# Patient Record
Sex: Male | Born: 1955 | Race: Black or African American | Hispanic: No | Marital: Single | Smoking: Current every day smoker
Health system: Southern US, Community
[De-identification: ages and names within clinical notes are randomized; demographics above are authoritative.]

## PROBLEM LIST (undated history)

## (undated) DIAGNOSIS — K219 Gastro-esophageal reflux disease without esophagitis: Secondary | ICD-10-CM

## (undated) DIAGNOSIS — M545 Low back pain, unspecified: Secondary | ICD-10-CM

## (undated) DIAGNOSIS — K029 Dental caries, unspecified: Secondary | ICD-10-CM

## (undated) DIAGNOSIS — N400 Enlarged prostate without lower urinary tract symptoms: Secondary | ICD-10-CM

## (undated) DIAGNOSIS — E538 Deficiency of other specified B group vitamins: Secondary | ICD-10-CM

## (undated) DIAGNOSIS — F329 Major depressive disorder, single episode, unspecified: Secondary | ICD-10-CM

## (undated) DIAGNOSIS — I10 Essential (primary) hypertension: Secondary | ICD-10-CM

## (undated) DIAGNOSIS — F419 Anxiety disorder, unspecified: Secondary | ICD-10-CM

## (undated) DIAGNOSIS — M1990 Unspecified osteoarthritis, unspecified site: Secondary | ICD-10-CM

## (undated) DIAGNOSIS — E119 Type 2 diabetes mellitus without complications: Secondary | ICD-10-CM

## (undated) DIAGNOSIS — F32A Depression, unspecified: Secondary | ICD-10-CM

## (undated) DIAGNOSIS — K469 Unspecified abdominal hernia without obstruction or gangrene: Secondary | ICD-10-CM

## (undated) HISTORY — DX: Type 2 diabetes mellitus without complications: E11.9

## (undated) HISTORY — PX: HERNIA REPAIR: SHX51

## (undated) HISTORY — DX: Unspecified abdominal hernia without obstruction or gangrene: K46.9

## (undated) HISTORY — DX: Major depressive disorder, single episode, unspecified: F32.9

## (undated) HISTORY — DX: Gastro-esophageal reflux disease without esophagitis: K21.9

## (undated) HISTORY — DX: Low back pain: M54.5

## (undated) HISTORY — DX: Dental caries, unspecified: K02.9

## (undated) HISTORY — DX: Unspecified osteoarthritis, unspecified site: M19.90

## (undated) HISTORY — DX: Low back pain, unspecified: M54.50

## (undated) HISTORY — DX: Deficiency of other specified B group vitamins: E53.8

## (undated) HISTORY — DX: Essential (primary) hypertension: I10

## (undated) HISTORY — DX: Benign prostatic hyperplasia without lower urinary tract symptoms: N40.0

## (undated) HISTORY — DX: Depression, unspecified: F32.A

## (undated) HISTORY — DX: Anxiety disorder, unspecified: F41.9

---

## 2001-08-23 ENCOUNTER — Encounter: Payer: Self-pay | Admitting: Emergency Medicine

## 2001-08-23 ENCOUNTER — Emergency Department (HOSPITAL_COMMUNITY): Admission: EM | Admit: 2001-08-23 | Discharge: 2001-08-23 | Payer: Self-pay | Admitting: *Deleted

## 2002-09-12 ENCOUNTER — Encounter: Payer: Self-pay | Admitting: Emergency Medicine

## 2002-09-12 ENCOUNTER — Emergency Department (HOSPITAL_COMMUNITY): Admission: EM | Admit: 2002-09-12 | Discharge: 2002-09-12 | Payer: Self-pay | Admitting: Emergency Medicine

## 2003-10-01 ENCOUNTER — Ambulatory Visit (HOSPITAL_COMMUNITY): Admission: RE | Admit: 2003-10-01 | Discharge: 2003-10-01 | Payer: Self-pay | Admitting: Urology

## 2004-06-23 ENCOUNTER — Ambulatory Visit: Payer: Self-pay | Admitting: Internal Medicine

## 2004-07-05 ENCOUNTER — Ambulatory Visit: Payer: Self-pay | Admitting: Internal Medicine

## 2004-08-30 ENCOUNTER — Ambulatory Visit: Payer: Self-pay | Admitting: Internal Medicine

## 2004-10-03 ENCOUNTER — Ambulatory Visit: Payer: Self-pay | Admitting: Internal Medicine

## 2004-11-18 ENCOUNTER — Ambulatory Visit: Payer: Self-pay | Admitting: Internal Medicine

## 2004-12-26 ENCOUNTER — Ambulatory Visit: Payer: Self-pay | Admitting: Internal Medicine

## 2005-01-02 ENCOUNTER — Ambulatory Visit: Payer: Self-pay | Admitting: Internal Medicine

## 2005-01-11 ENCOUNTER — Ambulatory Visit (HOSPITAL_COMMUNITY): Admission: RE | Admit: 2005-01-11 | Discharge: 2005-01-11 | Payer: Self-pay | Admitting: Chiropractic Medicine

## 2005-01-23 ENCOUNTER — Ambulatory Visit: Payer: Self-pay | Admitting: Internal Medicine

## 2005-05-02 ENCOUNTER — Ambulatory Visit: Payer: Self-pay | Admitting: Internal Medicine

## 2006-05-01 ENCOUNTER — Ambulatory Visit: Payer: Self-pay | Admitting: Internal Medicine

## 2006-09-05 ENCOUNTER — Ambulatory Visit: Payer: Self-pay | Admitting: Internal Medicine

## 2007-03-13 ENCOUNTER — Ambulatory Visit: Payer: Self-pay | Admitting: Internal Medicine

## 2007-03-13 LAB — CONVERTED CEMR LAB
Cholesterol: 180 mg/dL (ref 0–200)
Direct LDL: 65.2 mg/dL
Glucose, Bld: 96 mg/dL (ref 70–99)
HDL: 34.1 mg/dL — ABNORMAL LOW (ref >39.0)
Sodium: 143 meq/L (ref 135–145)

## 2007-03-20 DIAGNOSIS — G5600 Carpal tunnel syndrome, unspecified upper limb: Secondary | ICD-10-CM

## 2007-03-20 DIAGNOSIS — I10 Essential (primary) hypertension: Secondary | ICD-10-CM | POA: Insufficient documentation

## 2007-03-20 DIAGNOSIS — F4323 Adjustment disorder with mixed anxiety and depressed mood: Secondary | ICD-10-CM | POA: Insufficient documentation

## 2007-03-20 DIAGNOSIS — N320 Bladder-neck obstruction: Secondary | ICD-10-CM

## 2007-03-20 DIAGNOSIS — M545 Low back pain: Secondary | ICD-10-CM

## 2007-03-20 DIAGNOSIS — N529 Male erectile dysfunction, unspecified: Secondary | ICD-10-CM | POA: Insufficient documentation

## 2007-03-27 ENCOUNTER — Ambulatory Visit: Payer: Self-pay | Admitting: Internal Medicine

## 2007-06-24 ENCOUNTER — Ambulatory Visit: Payer: Self-pay | Admitting: Internal Medicine

## 2007-06-24 DIAGNOSIS — M1990 Unspecified osteoarthritis, unspecified site: Secondary | ICD-10-CM | POA: Insufficient documentation

## 2007-06-24 DIAGNOSIS — N400 Enlarged prostate without lower urinary tract symptoms: Secondary | ICD-10-CM | POA: Insufficient documentation

## 2007-06-24 DIAGNOSIS — E1165 Type 2 diabetes mellitus with hyperglycemia: Secondary | ICD-10-CM

## 2007-06-24 DIAGNOSIS — K219 Gastro-esophageal reflux disease without esophagitis: Secondary | ICD-10-CM

## 2007-06-24 DIAGNOSIS — F411 Generalized anxiety disorder: Secondary | ICD-10-CM

## 2007-06-24 DIAGNOSIS — E119 Type 2 diabetes mellitus without complications: Secondary | ICD-10-CM | POA: Insufficient documentation

## 2007-06-24 DIAGNOSIS — J209 Acute bronchitis, unspecified: Secondary | ICD-10-CM

## 2007-06-24 LAB — CONVERTED CEMR LAB: Blood Glucose, Fingerstick: 74

## 2007-06-25 LAB — CONVERTED CEMR LAB
Creatinine, Ser: 1.4 mg/dL (ref 0.4–1.5)
Glucose, Bld: 95 mg/dL (ref 70–99)
Hgb A1c MFr Bld: 6.6 % — ABNORMAL HIGH (ref 4.6–6.0)
Potassium: 3.8 meq/L (ref 3.5–5.1)
Sodium: 140 meq/L (ref 135–145)

## 2007-08-22 DIAGNOSIS — E538 Deficiency of other specified B group vitamins: Secondary | ICD-10-CM

## 2007-08-22 HISTORY — DX: Deficiency of other specified B group vitamins: E53.8

## 2007-10-11 ENCOUNTER — Ambulatory Visit: Payer: Self-pay | Admitting: Internal Medicine

## 2007-10-11 LAB — CONVERTED CEMR LAB: Blood Glucose, Fingerstick: 93

## 2007-12-31 ENCOUNTER — Telehealth: Payer: Self-pay | Admitting: Internal Medicine

## 2008-02-06 ENCOUNTER — Ambulatory Visit: Payer: Self-pay | Admitting: Internal Medicine

## 2008-02-08 LAB — CONVERTED CEMR LAB
ALT: 16 units/L (ref 0–53)
Albumin: 3.8 g/dL (ref 3.5–5.2)
BUN: 12 mg/dL (ref 6–23)
Bilirubin, Direct: 0.1 mg/dL (ref 0.0–0.3)
Calcium: 9.4 mg/dL (ref 8.4–10.5)
Creatinine, Ser: 1.3 mg/dL (ref 0.4–1.5)
GFR calc Af Amer: 75 mL/min
Glucose, Bld: 131 mg/dL — ABNORMAL HIGH (ref 70–99)
HDL: 41.1 mg/dL (ref >39.0)
Hgb A1c MFr Bld: 6.6 % — ABNORMAL HIGH (ref 4.6–6.0)
Sodium: 141 meq/L (ref 135–145)
TSH: 0.91 microintl units/mL (ref 0.35–5.50)
Total Protein: 7.1 g/dL (ref 6.0–8.3)
Triglycerides: 134 mg/dL (ref 0–149)
VLDL: 27 mg/dL (ref 0–40)
Vitamin B-12: 221 pg/mL (ref 211–911)

## 2008-02-11 ENCOUNTER — Encounter: Payer: Self-pay | Admitting: Internal Medicine

## 2008-03-09 ENCOUNTER — Ambulatory Visit: Payer: Self-pay | Admitting: Internal Medicine

## 2008-03-09 DIAGNOSIS — E538 Deficiency of other specified B group vitamins: Secondary | ICD-10-CM

## 2008-03-24 ENCOUNTER — Telehealth: Payer: Self-pay | Admitting: Internal Medicine

## 2008-07-07 ENCOUNTER — Ambulatory Visit: Payer: Self-pay | Admitting: Internal Medicine

## 2008-07-07 LAB — CONVERTED CEMR LAB
BUN: 12 mg/dL (ref 6–23)
Calcium: 9.6 mg/dL (ref 8.4–10.5)
Creatinine, Ser: 1.4 mg/dL (ref 0.4–1.5)
GFR calc Af Amer: 68 mL/min
GFR calc non Af Amer: 57 mL/min
Hgb A1c MFr Bld: 6.8 % — ABNORMAL HIGH (ref 4.6–6.0)

## 2008-07-08 ENCOUNTER — Ambulatory Visit: Payer: Self-pay | Admitting: Internal Medicine

## 2008-07-14 IMAGING — CR DG CHEST 2V
2 series · 2 of 2 positions shown · non-contrast
Comparison: 09/12/02.

CLINICAL DATA: No history provided.
 CHEST ? 2 VIEW:

[view not recorded (1 of 2)]
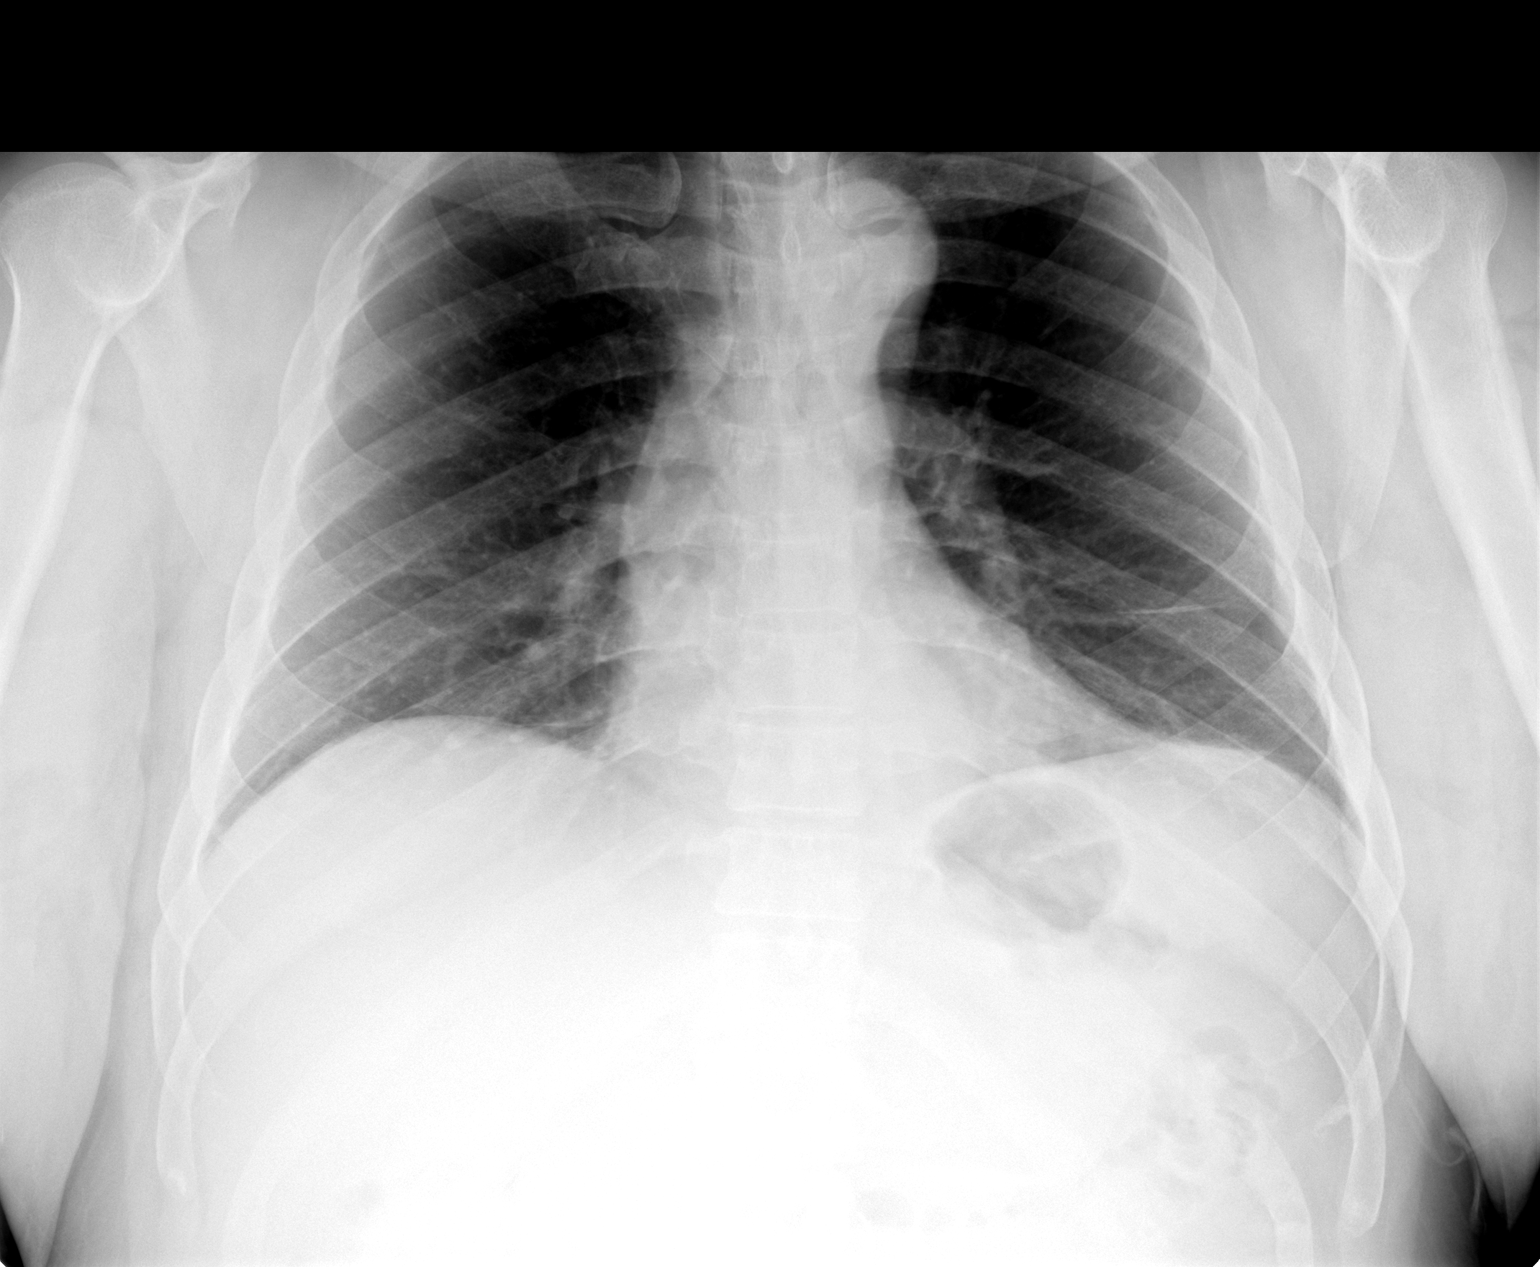

[view not recorded (2 of 2)]
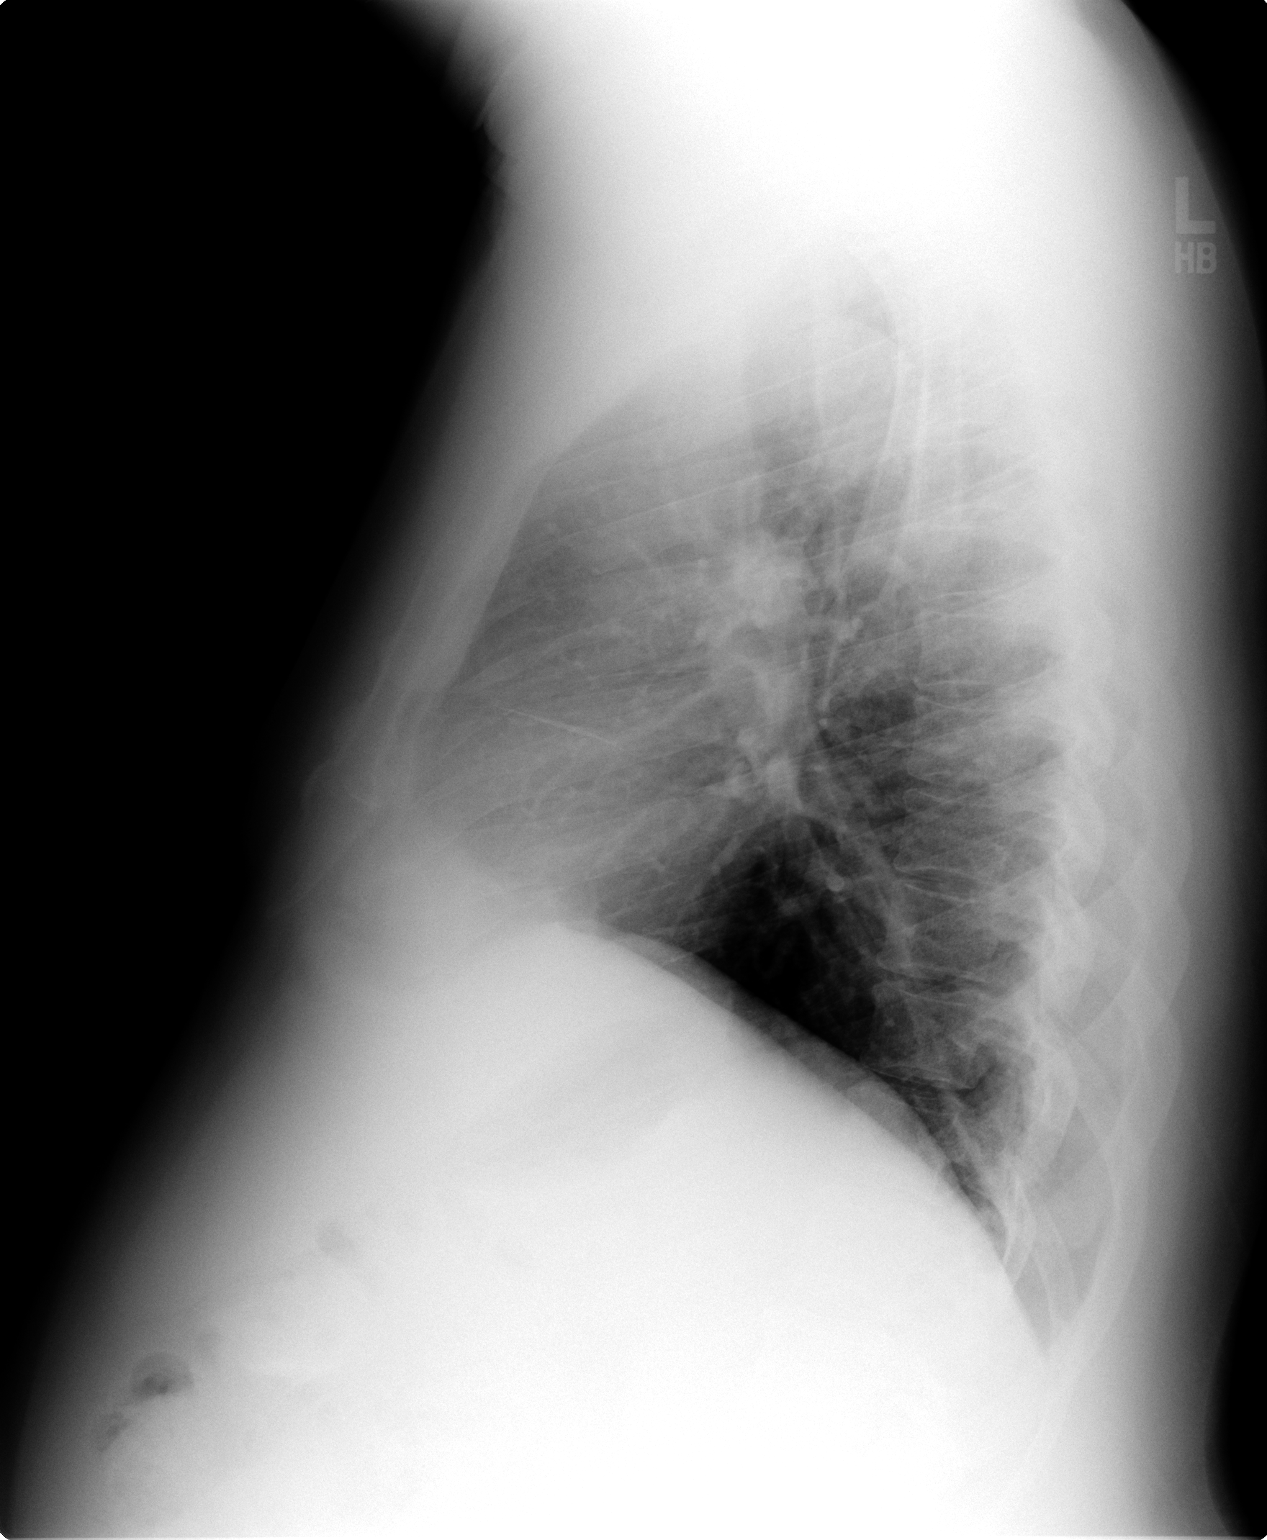

[2 of 2 positions shown; findings below may reference images not displayed]

FINDINGS: Trachea is midline.  Heart size is stable.  Left basilar subsegmental atelectasis and/or scar.  The lungs are otherwise clear.
IMPRESSION: No acute findings.

## 2008-10-12 ENCOUNTER — Ambulatory Visit: Payer: Self-pay | Admitting: Internal Medicine

## 2008-10-15 LAB — CONVERTED CEMR LAB
ALT: 23 units/L (ref 0–53)
AST: 18 units/L (ref 0–37)
CO2: 30 meq/L (ref 19–32)
Calcium: 9.9 mg/dL (ref 8.4–10.5)
Chloride: 101 meq/L (ref 96–112)
Creatinine, Ser: 1.2 mg/dL (ref 0.4–1.5)
Glucose, Bld: 115 mg/dL — ABNORMAL HIGH (ref 70–99)
Hgb A1c MFr Bld: 7.2 % — ABNORMAL HIGH (ref 4.6–6.0)
TSH: 0.7 microintl units/mL (ref 0.35–5.50)
Total Bilirubin: 0.8 mg/dL (ref 0.3–1.2)
Total Protein: 7.6 g/dL (ref 6.0–8.3)

## 2008-10-16 ENCOUNTER — Ambulatory Visit: Payer: Self-pay | Admitting: Internal Medicine

## 2009-01-15 ENCOUNTER — Ambulatory Visit: Payer: Self-pay | Admitting: Internal Medicine

## 2009-01-15 LAB — CONVERTED CEMR LAB
BUN: 14 mg/dL (ref 6–23)
CO2: 29 meq/L (ref 19–32)
Calcium: 9.3 mg/dL (ref 8.4–10.5)
GFR calc non Af Amer: 68.24 mL/min (ref >60)
Glucose, Bld: 157 mg/dL — ABNORMAL HIGH (ref 70–99)
Potassium: 3.5 meq/L (ref 3.5–5.1)
Sodium: 140 meq/L (ref 135–145)

## 2009-01-20 ENCOUNTER — Telehealth (INDEPENDENT_AMBULATORY_CARE_PROVIDER_SITE_OTHER): Payer: Self-pay | Admitting: *Deleted

## 2009-02-02 ENCOUNTER — Telehealth: Payer: Self-pay | Admitting: Internal Medicine

## 2009-05-21 ENCOUNTER — Ambulatory Visit: Payer: Self-pay | Admitting: Internal Medicine

## 2009-05-21 DIAGNOSIS — K029 Dental caries, unspecified: Secondary | ICD-10-CM

## 2009-05-24 LAB — CONVERTED CEMR LAB
BUN: 16 mg/dL (ref 6–23)
Chloride: 105 meq/L (ref 96–112)
Creatinine, Ser: 1.4 mg/dL (ref 0.4–1.5)
Glucose, Bld: 111 mg/dL — ABNORMAL HIGH (ref 70–99)
Potassium: 3.9 meq/L (ref 3.5–5.1)

## 2009-07-29 ENCOUNTER — Telehealth: Payer: Self-pay | Admitting: Internal Medicine

## 2009-08-05 ENCOUNTER — Emergency Department (HOSPITAL_BASED_OUTPATIENT_CLINIC_OR_DEPARTMENT_OTHER): Admission: EM | Admit: 2009-08-05 | Discharge: 2009-08-06 | Payer: Self-pay | Admitting: Emergency Medicine

## 2009-08-06 ENCOUNTER — Telehealth: Payer: Self-pay | Admitting: Internal Medicine

## 2009-08-06 ENCOUNTER — Ambulatory Visit: Payer: Self-pay | Admitting: Diagnostic Radiology

## 2009-08-10 ENCOUNTER — Ambulatory Visit: Payer: Self-pay | Admitting: Internal Medicine

## 2009-08-10 DIAGNOSIS — K4090 Unilateral inguinal hernia, without obstruction or gangrene, not specified as recurrent: Secondary | ICD-10-CM | POA: Insufficient documentation

## 2009-09-22 ENCOUNTER — Encounter: Payer: Self-pay | Admitting: Internal Medicine

## 2009-09-24 ENCOUNTER — Ambulatory Visit: Payer: Self-pay | Admitting: Internal Medicine

## 2009-11-05 ENCOUNTER — Telehealth: Payer: Self-pay | Admitting: Internal Medicine

## 2009-12-03 ENCOUNTER — Ambulatory Visit: Payer: Self-pay | Admitting: Internal Medicine

## 2010-03-30 ENCOUNTER — Ambulatory Visit: Payer: Self-pay | Admitting: Internal Medicine

## 2010-04-04 ENCOUNTER — Telehealth: Payer: Self-pay | Admitting: Internal Medicine

## 2010-04-18 ENCOUNTER — Encounter: Payer: Self-pay | Admitting: Internal Medicine

## 2010-06-29 ENCOUNTER — Ambulatory Visit: Payer: Self-pay | Admitting: Internal Medicine

## 2010-07-01 ENCOUNTER — Ambulatory Visit: Payer: Self-pay | Admitting: Internal Medicine

## 2010-07-01 LAB — CONVERTED CEMR LAB
AST: 17 units/L (ref 0–37)
Albumin: 3.7 g/dL (ref 3.5–5.2)
Alkaline Phosphatase: 42 units/L (ref 39–117)
BUN: 14 mg/dL (ref 6–23)
Basophils Relative: 0.4 % (ref 0.0–3.0)
Bilirubin, Direct: 0.1 mg/dL (ref 0.0–0.3)
CO2: 28 meq/L (ref 19–32)
Chloride: 102 meq/L (ref 96–112)
Creatinine, Ser: 1.2 mg/dL (ref 0.4–1.5)
Direct LDL: 71.5 mg/dL
Eosinophils Absolute: 0.3 10*3/uL (ref 0.0–0.7)
Glucose, Bld: 167 mg/dL — ABNORMAL HIGH (ref 70–99)
HCT: 43.2 % (ref 39.0–52.0)
Hemoglobin: 14.8 g/dL (ref 13.0–17.0)
Lymphocytes Relative: 38.1 % (ref 12.0–46.0)
Lymphs Abs: 3.5 10*3/uL (ref 0.7–4.0)
MCHC: 34.4 g/dL (ref 30.0–36.0)
MCV: 92.6 fL (ref 78.0–100.0)
Neutro Abs: 4.7 10*3/uL (ref 1.4–7.7)
Potassium: 3.8 meq/L (ref 3.5–5.1)
RBC: 4.66 M/uL (ref 4.22–5.81)
Total Bilirubin: 0.6 mg/dL (ref 0.3–1.2)
Total CHOL/HDL Ratio: 4
VLDL: 45.8 mg/dL — ABNORMAL HIGH (ref 0.0–40.0)

## 2010-08-25 ENCOUNTER — Telehealth: Payer: Self-pay | Admitting: Internal Medicine

## 2010-09-20 NOTE — Assessment & Plan Note (Signed)
Summary: 3 MO ROV/NWS  #   Vital Signs:  Patient profile:   55 year old male Height:      67 inches Weight:      212 pounds BMI:     33.32 Temp:     99.0 degrees F oral Pulse rate:   72 / minute Pulse rhythm:   regular Resp:     16 per minute BP sitting:   112 / 80  (left arm) Cuff size:   regular  Vitals Entered By: Lanier Prude, Beverly Gust) (July 01, 2010 4:13 PM) CC: 3 mo f/u Is Patient Diabetic? Yes   CC:  3 mo f/u.  History of Present Illness: The patient presents for a follow up of hypertension, diabetes, hyperlipidemia   Allergies: No Known Drug Allergies  Past History:  Past Medical History: Last updated: 12/03/2009 Depression Hypertension Low back pain Anxiety GERD Osteoarthritis Benign prostatic hypertrophy Diabetes mellitus, type II Low Vit B12 2009 R hernia  Social History: Last updated: 05/21/2009 Occupation: on disability Single Current Smoker (chewing) Alcohol use-no Regular exercise-no  Review of Systems       The patient complains of abdominal pain and hematuria.  The patient denies fever, weight loss, weight gain, dyspnea on exertion, melena, hematochezia, and severe indigestion/heartburn.    Physical Exam  General:  overweight-appearing.   Nose:  External nasal examination shows no deformity or inflammation. Nasal mucosa are pink and moist without lesions or exudates. Mouth:  Oral mucosa and oropharynx without lesions or exudates.  Teeth and gums are in bad shape w/swelling  and gingivitis Lungs:  Normal respiratory effort, chest expands symmetrically. Lungs are clear to auscultation, no crackles or wheezes. Heart:  Normal rate and regular rhythm. S1 and S2 normal without gallop, murmur, click, rub or other extra sounds. Abdomen:  Bowel sounds positive,abdomen soft and non-tender without masses, organomegaly. 4-5 cm R inguinalhernia was noted. NT. Msk:  Lumbar-sacral spine is tender to palpation over paraspinal muscles and  painfull with the ROM  B knees are tender w/ROM Neurologic:  No cranial nerve deficits noted. Station and gait are normal. Plantar reflexes are down-going bilaterally. DTRs are symmetrical throughout. Sensory, motor and coordinative functions appear intact. Skin:  WNL  Psych:  Oriented X3, normally interactive, good eye contact, and not anxious appearing.     Impression & Recommendations:  Problem # 1:  DENTAL CARIES (ICD-521.00) Assessment Unchanged States he is working on getting an appt at [facility]  Problem # 2:  B12 DEFICIENCY (ICD-266.2) Assessment: Unchanged On the regimen of medicine(s) reflected in the chart    Problem # 3:  DIABETES MELLITUS, TYPE II (ICD-250.00) Assessment: Deteriorated  The following medications were removed from the medication list:    Glucovance 2.5-500 Mg Tabs (Glyburide-metformin) .Marland Kitchen... 1 two times a day His updated medication list for this problem includes:    Glimepiride 4 Mg Tabs (Glimepiride) .Marland Kitchen... 1 by mouth once daily for diabetes    Metformin Hcl 1000 Mg Tabs (Metformin hcl) .Marland Kitchen... 1 by mouth two times a day for diabetes  Labs Reviewed: Creat: 1.2 (06/29/2010)    Reviewed HgBA1c results: 8.3 (06/29/2010)  7.4 (05/21/2009)  Problem # 4:  ANXIETY (ICD-300.00) Assessment: Improved  His updated medication list for this problem includes:    Wellbutrin Sr 150 Mg Tb12 (Bupropion hcl) ..... Once daily  Problem # 5:  OSTEOARTHRITIS (ICD-715.90) Assessment: Unchanged  His updated medication list for this problem includes:    Hydrocodone-acetaminophen 5-325 Mg Tabs (Hydrocodone-acetaminophen) .Marland Kitchen... 1 by  mouth qid prn  Complete Medication List: 1)  Wellbutrin Sr 150 Mg Tb12 (Bupropion hcl) .... Once daily 2)  Maxzide-25 37.5-25 Mg Tabs (Triamterene-hctz) .... Once daily 3)  Klor-con M20 20 Meq Tbcr (Potassium chloride crys cr) .... Once daily 4)  Fish Oil Oil (Fish oil) .Marland Kitchen.. 1 by mouth bid 5)  Vitamin B-12 Cr 1000 Mcg Tbcr (Cyanocobalamin)  .... Take one tablet by mouth daily 6)  Vitamin D3 1000 Unit Tabs (Cholecalciferol) .Marland Kitchen.. 1 qd 7)  Cialis 20 Mg Tabs (Tadalafil) .Marland Kitchen.. 1 tablet every other day as needed for erectile dysfunction 8)  Glimepiride 4 Mg Tabs (Glimepiride) .Marland Kitchen.. 1 by mouth once daily for diabetes 9)  Metformin Hcl 1000 Mg Tabs (Metformin hcl) .Marland Kitchen.. 1 by mouth two times a day for diabetes 10)  Hydrocodone-acetaminophen 5-325 Mg Tabs (Hydrocodone-acetaminophen) .Marland Kitchen.. 1 by mouth qid prn  Patient Instructions: 1)  Please schedule a follow-up appointment in 4 months. 2)  BMP prior to visit, ICD-9: 3)  PSA prior to visit, ICD-9: 4)  HbgA1C prior to visit, ICD-9:995.20 v70.0  250.02 Prescriptions: HYDROCODONE-ACETAMINOPHEN 5-325 MG  TABS (HYDROCODONE-ACETAMINOPHEN) 1 by mouth qid prn  #60 x 1   Entered and Authorized by:   Tresa Garter MD   Signed by:   Tresa Garter MD on 07/01/2010   Method used:   Print then Give to Patient   RxID:   [id number] METFORMIN HCL 1000 MG TABS (METFORMIN HCL) 1 by mouth two times a day for diabetes  #60 x 12   Entered and Authorized by:   Tresa Garter MD   Signed by:   Tresa Garter MD on 07/01/2010   Method used:   Print then Give to Patient   RxID:   [id number] GLIMEPIRIDE 4 MG TABS (GLIMEPIRIDE) 1 by mouth once daily for diabetes  #30 x 12   Entered and Authorized by:   Tresa Garter MD   Signed by:   Tresa Garter MD on 07/01/2010   Method used:   Print then Give to Patient   RxID:   [id number]    Orders Added: 1)  Est. Patient Level IV [01027]    Contraindications/Deferment of Procedures/Staging:    Test/Procedure: FLU VAX    Reason for deferment: patient declined  Appended Document: 3 MO ROV/NWS  # Correction : no hematuria, no abd pain

## 2010-09-20 NOTE — Medication Information (Signed)
Summary: Diabetes Supplies/[facility]  Diabetes Supplies/[facility]   Imported By: Sherian Rein 04/22/2010 08:22:36  _____________________________________________________________________  External Attachment:    Type:   Image     Comment:   External Document

## 2010-09-20 NOTE — Letter (Signed)
Summary: Generic Letter  [facility]  [address] [place], [place] [address]   Phone: [phone]  Fax: [phone]    12/03/2009  TO: [employer]  RE: Timothy Schmidt [address] [place], [place]  [address]    [name] has been disabled for some time. Please excuse his late payment.           Sincerely,   Jacinta Shoe MD

## 2010-09-20 NOTE — Progress Notes (Signed)
Summary: RESULTS  Phone Note Call from Patient   Summary of Call: Patient is requesting results of labs. I see lab visit from last wednesday but no results. Will inquire with lab. - Per lab pt was not fasting and he was going to return. He must have forgotten, need to call paitent and advise him to come back to lab fasting.  Initial call taken by: Lamar Sprinkles, CMA,  April 04, 2010 12:42 PM  Follow-up for Phone Call        Left detailed vm on hm # for patient to come into lab for fasting labs.  Follow-up by: Lamar Sprinkles, CMA,  April 04, 2010 3:20 PM

## 2010-09-20 NOTE — Assessment & Plan Note (Signed)
Summary: 4 MO ROV /NWS  #   Vital Signs:  Patient profile:   55 year old male Height:      67 inches Weight:      213.50 pounds BMI:     33.56 O2 Sat:      96 % on Room air Temp:     99.1 degrees F oral Pulse rate:   102 / minute BP sitting:   130 / 70  (left arm) Cuff size:   large  Vitals Entered By: Lucious Groves (December 03, 2009 2:22 PM)  O2 Flow:  Room air CC: Pt is here to discuss letter for [employer], refill PCN, and blood sugar check. Is Patient Diabetic? Yes Did you bring your meter with you today? No Pain Assessment Patient in pain? no      CBG Result 146 Comments Patient notes that he has not had surgery, nor has he gotten his teeth fixed./kb   CC:  Pt is here to discuss letter for [employer], refill PCN, and and blood sugar check..  History of Present Illness: The patient presents for a follow up of hypertension, diabetes, hyperlipidemia C/o teeth problems, hernia, OA  Current Medications (verified): 1)  Wellbutrin Sr 150 Mg  Tb12 (Bupropion Hcl) .... Once Daily 2)  Glucovance 2.5-500 Mg  Tabs (Glyburide-Metformin) .Marland Kitchen.. 1 Two Times A Day 3)  Maxzide-25 37.5-25 Mg Tabs (Triamterene-Hctz) .... Once Daily 4)  Klor-Con M20 20 Meq  Tbcr (Potassium Chloride Crys Cr) .... Once Daily 5)  Hydrocodone-Acetaminophen 5-325 Mg  Tabs (Hydrocodone-Acetaminophen) .Marland Kitchen.. 1 By Mouth Qid Prn 6)  Fish Oil   Oil (Fish Oil) .Marland Kitchen.. 1 By Mouth Bid 7)  Vitamin B-12 Cr 1000 Mcg  Tbcr (Cyanocobalamin) .... Take One Tablet By Mouth Daily 8)  Vitamin D3 1000 Unit  Tabs (Cholecalciferol) .Marland Kitchen.. 1 Qd 9)  Penicillin V Potassium 500 Mg  Tabs (Penicillin V Potassium) .Marland Kitchen.. 1 Qid X10 D Prn 10)  Cialis 20 Mg Tabs (Tadalafil) .Marland Kitchen.. 1 Tablet Every Other Day As Needed For Erectile Dysfunction  Allergies (verified): No Known Drug Allergies  Past History:  Social History: Last updated: 05/21/2009 Occupation: on disability Single Current Smoker (chewing) Alcohol use-no Regular exercise-no  Past Medical  History: Depression Hypertension Low back pain Anxiety GERD Osteoarthritis Benign prostatic hypertrophy Diabetes mellitus, type II Low Vit B12 2009 R hernia  Review of Systems       The patient complains of fever.  The patient denies weight gain, vision loss, peripheral edema, hemoptysis, and abdominal pain.         OA, LBP  Physical Exam  General:  overweight-appearing.   Nose:  External nasal examination shows no deformity or inflammation. Nasal mucosa are pink and moist without lesions or exudates. Mouth:  Oral mucosa and oropharynx without lesions or exudates.  Teeth and gums are in bad shape w/swelling  and gingivitis Lungs:  Normal respiratory effort, chest expands symmetrically. Lungs are clear to auscultation, no crackles or wheezes. Heart:  Normal rate and regular rhythm. S1 and S2 normal without gallop, murmur, click, rub or other extra sounds. Abdomen:  Bowel sounds positive,abdomen soft and non-tender without masses, organomegaly. 4-5 cm R inguinalhernia was noted. NT. Msk:  Lumbar-sacral spine is tender to palpation over paraspinal muscles and painfull with the ROM  B knees are tender w/ROM Neurologic:  No cranial nerve deficits noted. Station and gait are normal. Plantar reflexes are down-going bilaterally. DTRs are symmetrical throughout. Sensory, motor and coordinative functions appear intact. Skin:  WNL  Psych:  Oriented X3, normally interactive, good eye contact, and not anxious appearing.     Impression & Recommendations:  Problem # 1:  INGUINAL HERNIA, UNILATERAL (ICD-550.90) Assessment Unchanged Surgery is pending   Problem # 2:  DENTAL CARIES (ICD-521.00) Assessment: Unchanged Unable to afford care; still chewing tobacco...  Problem # 3:  DIABETES MELLITUS, TYPE II (ICD-250.00) Assessment: Unchanged  His updated medication list for this problem includes:    Glucovance 2.5-500 Mg Tabs (Glyburide-metformin) .Marland Kitchen... 1 two times a day  Orders: Glucose,  (CBG) (11914)  Problem # 4:  OSTEOARTHRITIS (ICD-715.90) Assessment: Unchanged  His updated medication list for this problem includes:    Hydrocodone-acetaminophen 5-325 Mg Tabs (Hydrocodone-acetaminophen) .Marland Kitchen... 1 by mouth qid prn  Problem # 5:  HYPERTENSION (ICD-401.9) Assessment: Unchanged  His updated medication list for this problem includes:    Maxzide-25 37.5-25 Mg Tabs (Triamterene-hctz) ..... Once daily  BP today: 130/70 Prior BP: 110/70 (08/10/2009)  Labs Reviewed: K+: 3.9 (05/21/2009) Creat: : 1.4 (05/21/2009)   Chol: 178 (02/06/2008)   HDL: 41.1 (02/06/2008)   LDL: 110 (02/06/2008)   TG: 134 (02/06/2008)  Problem # 6:  Low grade fever Assessment: New possible URI Use over-the-counter medicines for "cold": Tylenol  650mg  or Advil 400mg  every 6 hours  for fever; Delsym or Robutussin for cough. Mucinex or Mucinex D for congestion. Ricola or Halls for sore throat. Office visit if not better or if worse.   Complete Medication List: 1)  Wellbutrin Sr 150 Mg Tb12 (Bupropion hcl) .... Once daily 2)  Glucovance 2.5-500 Mg Tabs (Glyburide-metformin) .Marland Kitchen.. 1 two times a day 3)  Maxzide-25 37.5-25 Mg Tabs (Triamterene-hctz) .... Once daily 4)  Klor-con M20 20 Meq Tbcr (Potassium chloride crys cr) .... Once daily 5)  Hydrocodone-acetaminophen 5-325 Mg Tabs (Hydrocodone-acetaminophen) .Marland Kitchen.. 1 by mouth qid prn 6)  Fish Oil Oil (Fish oil) .Marland Kitchen.. 1 by mouth bid 7)  Vitamin B-12 Cr 1000 Mcg Tbcr (Cyanocobalamin) .... Take one tablet by mouth daily 8)  Vitamin D3 1000 Unit Tabs (Cholecalciferol) .Marland Kitchen.. 1 qd 9)  Penicillin V Potassium 500 Mg Tabs (Penicillin v potassium) .Marland Kitchen.. 1 qid x10 d prn 10)  Cialis 20 Mg Tabs (Tadalafil) .Marland Kitchen.. 1 tablet every other day as needed for erectile dysfunction  Patient Instructions: 1)  Please schedule a follow-up appointment in 3 months. 2)  BMP prior to visit, ICD-9: 3)  Hepatic Panel prior to visit, ICD-9:790.29  995.20 250.00 4)  Lipid Panel prior to  visit, ICD-9: 5)  TSH prior to visit, ICD-9: 6)  CBC w/ Diff prior to visit, ICD-9: 7)  HbgA1C prior to visit, ICD-9: 8)  Vit B12 266.20 9)  Use over-the-counter medicines for "cold": Tylenol  650mg  or Advil 400mg  every 6 hours  for fever; Delsym or Robutussin for cough. Mucinex or Mucinex D for congestion. Ricola or Halls for sore throat. Office visit if not better or if worse.  Prescriptions: PENICILLIN V POTASSIUM 500 MG  TABS (PENICILLIN V POTASSIUM) 1 qid x10 d prn  #120 x 3   Entered and Authorized by:   Tresa Garter MD   Signed by:   Tresa Garter MD on 12/03/2009   Method used:   Electronically to        [facility] [id number] (retail)       [address]       [facility]       [place], [place]  [address]  Ph: [phone] or [phone]       Fax: [phone]   RxID:   [id number]

## 2010-09-20 NOTE — Medication Information (Signed)
Summary: Diabetic Supplies / [employer]  Diabetic Supplies / [employer]   Imported By: Lennie Odor 04/20/2010 14:34:20  _____________________________________________________________________  External Attachment:    Type:   Image     Comment:   External Document

## 2010-09-20 NOTE — Progress Notes (Signed)
Summary: FYI  Phone Note Call from Patient   Summary of Call: Pt c/o cough, fever, sweats at the beginning of this week. He used OTC diabetic tussin for cough and feels much better. No sob. Only complaint is that mucus comming out of nose is white. Advise if cough returns, fever or any change in symptoms go to UC, ER, or call office, pt agreed.  Initial call taken by: Lamar Sprinkles, CMA,  November 05, 2009 12:24 PM  Follow-up for Phone Call        Agree. Thx Follow-up by: Tresa Garter MD,  November 05, 2009 5:54 PM

## 2010-09-20 NOTE — Consult Note (Signed)
Summary: [facility]   Imported By: Sherian Rein 10/06/2009 10:23:45  _____________________________________________________________________  External Attachment:    Type:   Image     Comment:   External Document

## 2010-09-22 ENCOUNTER — Emergency Department (HOSPITAL_BASED_OUTPATIENT_CLINIC_OR_DEPARTMENT_OTHER)
Admission: EM | Admit: 2010-09-22 | Discharge: 2010-09-22 | Disposition: A | Discharge status: Home / Self Care | Payer: Medicare Other | Attending: Emergency Medicine | Admitting: Emergency Medicine

## 2010-09-22 ENCOUNTER — Emergency Department (INDEPENDENT_AMBULATORY_CARE_PROVIDER_SITE_OTHER): Payer: Medicare Other

## 2010-09-22 ENCOUNTER — Other Ambulatory Visit: Payer: Self-pay | Admitting: Emergency Medicine

## 2010-09-22 DIAGNOSIS — M25519 Pain in unspecified shoulder: Secondary | ICD-10-CM

## 2010-09-22 DIAGNOSIS — IMO0002 Reserved for concepts with insufficient information to code with codable children: Secondary | ICD-10-CM | POA: Insufficient documentation

## 2010-09-22 DIAGNOSIS — E1169 Type 2 diabetes mellitus with other specified complication: Secondary | ICD-10-CM | POA: Insufficient documentation

## 2010-09-22 DIAGNOSIS — X500XXA Overexertion from strenuous movement or load, initial encounter: Secondary | ICD-10-CM

## 2010-09-22 DIAGNOSIS — I509 Heart failure, unspecified: Secondary | ICD-10-CM | POA: Insufficient documentation

## 2010-09-22 DIAGNOSIS — I10 Essential (primary) hypertension: Secondary | ICD-10-CM | POA: Insufficient documentation

## 2010-09-22 DIAGNOSIS — Y92009 Unspecified place in unspecified non-institutional (private) residence as the place of occurrence of the external cause: Secondary | ICD-10-CM | POA: Insufficient documentation

## 2010-09-22 LAB — POCT CARDIAC MARKERS: Troponin i, poc: <0.05 ng/mL (ref 0.00–0.09)

## 2010-09-22 NOTE — Progress Notes (Signed)
Summary: REFERRAL AT [facility]  Phone Note Call from Patient Call back at Home Phone [phone]   Caller: Patient Summary of Call: left vm req referral...........Marland Kitchenleft mess for pt to call back with details Initial call taken by: Lanier Prude, [facility]),  August 25, 2010 4:59 PM  Follow-up for Phone Call        left message on machine for pt to return my call and advise of type of referral and reason for referral. Margaret Pyle, CMA  August 26, 2010 10:51 AM   Additional Follow-up for Phone Call Additional follow up Details #1::        WANTS TO GO TO [facility] FOR SURGERY ON [address]. [phone] OPT 1 IS THE NUMBER TO CALL. Additional Follow-up by: Hilarie Fredrickson,  August 26, 2010 10:56 AM    Additional Follow-up for Phone Call Additional follow up Details #2::    done Follow-up by: Tresa Garter MD,  August 29, 2010 1:25 PM  Additional Follow-up for Phone Call Additional follow up Details #3:: Details for Additional Follow-up Action Taken: [facility] to notify.Marland KitchenMarland KitchenAlvy Beal Archie CMA  August 30, 2010 12:59 PM

## 2010-09-28 ENCOUNTER — Telehealth: Payer: Self-pay | Admitting: Internal Medicine

## 2010-10-12 NOTE — Progress Notes (Signed)
Summary: ER Xray  Phone Note Call from Patient Call back at Home Phone [phone]   Summary of Call: Patient is requesting to have Dr Posey Rea review xrays - went to ER for shoulder pain.  Initial call taken by: Lamar Sprinkles, CMA,  September 28, 2010 9:36 AM  Follow-up for Phone Call        Pt called again. He wants to know what xrays show. Continues to have shoulder pain. Per pt, ER did not give him dx or anything further except pain meds. Wants to know if it could be spurs in his tissue?   Does pt need ortho Dr Doristine Section? Or can Dr Macario Golds help w/this?  Follow-up by: Lamar Sprinkles, CMA,  September 30, 2010 4:29 PM  Additional Follow-up for Phone Call Additional follow up Details #1::        CXR and shoulder xrays were normal OV with any MD this wk Additional Follow-up by: Tresa Garter MD,  October 03, 2010 5:46 PM    Additional Follow-up for Phone Call Additional follow up Details #2::    He feels better - Pt informed to come in if not better or becomes worse Follow-up by: Lamar Sprinkles, CMA,  October 03, 2010 6:32 PM

## 2010-10-28 ENCOUNTER — Other Ambulatory Visit: Payer: Self-pay | Admitting: Internal Medicine

## 2010-10-28 ENCOUNTER — Encounter (INDEPENDENT_AMBULATORY_CARE_PROVIDER_SITE_OTHER): Payer: Self-pay | Admitting: *Deleted

## 2010-10-28 ENCOUNTER — Other Ambulatory Visit: Payer: Medicare Other

## 2010-10-28 DIAGNOSIS — Z0000 Encounter for general adult medical examination without abnormal findings: Secondary | ICD-10-CM

## 2010-10-28 DIAGNOSIS — E1165 Type 2 diabetes mellitus with hyperglycemia: Secondary | ICD-10-CM

## 2010-10-28 DIAGNOSIS — N400 Enlarged prostate without lower urinary tract symptoms: Secondary | ICD-10-CM

## 2010-10-28 DIAGNOSIS — T887XXA Unspecified adverse effect of drug or medicament, initial encounter: Secondary | ICD-10-CM

## 2010-10-28 LAB — HEMOGLOBIN A1C: Hgb A1c MFr Bld: 8.3 % — ABNORMAL HIGH (ref 4.6–6.5)

## 2010-10-28 LAB — BASIC METABOLIC PANEL
Calcium: 9.7 mg/dL (ref 8.4–10.5)
Chloride: 103 mEq/L (ref 96–112)
Creatinine, Ser: 1.4 mg/dL (ref 0.4–1.5)
Sodium: 141 mEq/L (ref 135–145)

## 2010-10-28 LAB — PSA: PSA: 1.71 ng/mL (ref 0.10–4.00)

## 2010-10-31 ENCOUNTER — Encounter: Payer: Self-pay | Admitting: Internal Medicine

## 2010-10-31 ENCOUNTER — Encounter (INDEPENDENT_AMBULATORY_CARE_PROVIDER_SITE_OTHER): Payer: Medicare Other | Admitting: Internal Medicine

## 2010-10-31 DIAGNOSIS — K029 Dental caries, unspecified: Secondary | ICD-10-CM

## 2010-10-31 DIAGNOSIS — I10 Essential (primary) hypertension: Secondary | ICD-10-CM

## 2010-10-31 DIAGNOSIS — E119 Type 2 diabetes mellitus without complications: Secondary | ICD-10-CM

## 2010-10-31 DIAGNOSIS — Z0000 Encounter for general adult medical examination without abnormal findings: Secondary | ICD-10-CM

## 2010-11-08 NOTE — Assessment & Plan Note (Signed)
Summary: WELLNESS   /CD/NWS #   Vital Signs:  Patient profile:   55 year old male Height:      66.25 inches (168.28 cm) Weight:      214 pounds (97.27 kg) BMI:     34.40 O2 Sat:      97 % on Room air Temp:     98.8 degrees F (37.11 degrees C) oral Pulse rate:   98 / minute Resp:     16 per minute BP sitting:   106 / 62  (left arm) Cuff size:   regular  Vitals Entered By: Burnard Leigh CMA(AAMA) (October 31, 2010 3:22 PM)  O2 Flow:  Room air CC: CPE/Wellness Visit/sls,cma   CC:  CPE/Wellness Visit/sls and cma.  History of Present Illness: The patient presents for a preventive health examination  Patient past medical history, social history, and family history reviewed in detail no significant changes.  Patient is physically active. Depression is negative and mood is good. Hearing is normal, and able to perform activities of daily living. Risk of falling is negligible and home safety has been reviewed and is appropriate. Patient has normal height, he is overweight, and visual acuity is fair. Patient has been counseled on age-appropriate routine health concerns for screening and prevention. Education, counseling done. Cognition is nl The patient presents for a follow up of hypertension, diabetes, hernia   Preventive Screening-Counseling & Management  Alcohol-Tobacco     Alcohol drinks/day: 0     Smoking Status: quit > 6 months  Caffeine-Diet-Exercise     Caffeine use/day: none     Diet Comments: diabetic routine     Does Patient Exercise: yes     Type of exercise: walking     Depression Counseling: not indicated; screening negative for depression  Hep-HIV-STD-Contraception     Hepatitis Risk: no risk noted     Dental Visit-last 6 months no     Sun Exposure-Excessive: no  Safety-Violence-Falls     Seat Belt Use: yes     Firearms in the Home: firearms in the home     Smoke Detectors: yes     Violence in the Home: no risk noted     Sexual Abuse: no     Fall Risk:  none      Sexual History:  currently monogamous.    Current Medications (verified): 1)  Wellbutrin Sr 150 Mg  Tb12 (Bupropion Hcl) .... Once Daily 2)  Maxzide-25 37.5-25 Mg Tabs (Triamterene-Hctz) .... Once Daily 3)  Klor-Con M20 20 Meq  Tbcr (Potassium Chloride Crys Cr) .... Once Daily 4)  Fish Oil   Oil (Fish Oil) .Marland Kitchen.. 1 By Mouth Bid 5)  Vitamin B-12 Cr 1000 Mcg  Tbcr (Cyanocobalamin) .... Take One Tablet By Mouth Daily 6)  Vitamin D3 1000 Unit  Tabs (Cholecalciferol) .Marland Kitchen.. 1 Qd 7)  Cialis 20 Mg Tabs (Tadalafil) .Marland Kitchen.. 1 Tablet Every Other Day As Needed For Erectile Dysfunction 8)  Glimepiride 4 Mg Tabs (Glimepiride) .Marland Kitchen.. 1 By Mouth Once Daily For Diabetes 9)  Metformin Hcl 1000 Mg Tabs (Metformin Hcl) .Marland Kitchen.. 1 By Mouth Two Times A Day For Diabetes 10)  Hydrocodone-Acetaminophen 5-325 Mg  Tabs (Hydrocodone-Acetaminophen) .Marland Kitchen.. 1 By Mouth Qid Prn  Allergies (verified): No Known Drug Allergies  Past History:  Past Medical History: Depression Hypertension Low back pain Anxiety GERD Osteoarthritis Benign prostatic hypertrophy Diabetes mellitus, type II Low Vit B12 2009 R hernia Caries  Past Surgical History: Denies surgical history  Family History: Reviewed history from  06/24/2007 and no changes required. Family History Hypertension Family History of Stroke F 1st degree relative <60 M dementia  Social History: Reviewed history from 05/21/2009 and no changes required. Occupation: on disability Single Current Smoker (chewing) Alcohol use-no Regular exercise-no Smoking Status:  quit > 6 months Caffeine use/day:  none Does Patient Exercise:  yes Dental Care w/in 6 mos.:  no Seat Belt Use:  yes Fall Risk:  none Hepatitis Risk:  no risk noted Sun Exposure-Excessive:  no Sexual History:  currently monogamous  Review of Systems  The patient denies anorexia, fever, weight loss, weight gain, vision loss, decreased hearing, hoarseness, chest pain, syncope, dyspnea on  exertion, peripheral edema, prolonged cough, headaches, hemoptysis, abdominal pain, melena, hematochezia, severe indigestion/heartburn, hematuria, incontinence, genital sores, muscle weakness, suspicious skin lesions, transient blindness, difficulty walking, depression, unusual weight change, abnormal bleeding, enlarged lymph nodes, angioedema, and testicular masses.    Physical Exam  General:  overweight-appearing.   Head:  Normocephalic and atraumatic without obvious abnormalities. No apparent alopecia or balding. Eyes:  No corneal or conjunctival inflammation noted. EOMI. Perrla.  Ears:  External ear exam shows no significant lesions or deformities.  Otoscopic examination reveals clear canals, tympanic membranes are intact bilaterally without bulging, retraction, inflammation or discharge. Hearing is grossly normal bilaterally. Nose:  External nasal examination shows no deformity or inflammation. Nasal mucosa are pink and moist without lesions or exudates. Mouth:  Oral mucosa and oropharynx without lesions or exudates.  Teeth and gums are in bad shape w/swelling  and gingivitis Neck:  No deformities, masses, or tenderness noted. Lungs:  Normal respiratory effort, chest expands symmetrically. Lungs are clear to auscultation, no crackles or wheezes. Heart:  Normal rate and regular rhythm. S1 and S2 normal without gallop, murmur, click, rub or other extra sounds. Abdomen:  Bowel sounds positive,abdomen soft and non-tender without masses, organomegaly. 4-5 cm R inguinalhernia was noted. NT. Rectal:  per surgery Prostate:  per surgery Msk:  Lumbar-sacral spine is tender to palpation over paraspinal muscles and painfull with the ROM  B knees are tender w/ROM Pulses:  R and L carotid,radial,femoral,dorsalis pedis and posterior tibial pulses are full and equal bilaterally Extremities:  No clubbing, cyanosis, edema, or deformity noted with normal full range of motion of all joints.   Neurologic:  No  cranial nerve deficits noted. Station and gait are normal. Plantar reflexes are down-going bilaterally. DTRs are symmetrical throughout. Sensory, motor and coordinative functions appear intact. Skin:  WNL  Cervical Nodes:  No lymphadenopathy noted Inguinal Nodes:  No significant adenopathy Psych:  Oriented X3, normally interactive, good eye contact, and not anxious appearing.     Impression & Recommendations:  Problem # 1:  HEALTH MAINTENANCE EXAM (ICD-V70.0) Assessment New  Overall doing well, age appropriate education and counseling updated and referral for appropriate preventive services done unless declined, immunizations up to date or declined, diet counseling done if overweight, urged to quit smoking if smokes, most recent labs reviewed and current ordered if appropriate, ecg reviewed or declined (interpretation per ECG scanned in the EMR if done); information regarding Medicare Preventation requirements given if appropriate.  I have personally reviewed the Medicare Annual Wellness questionnaire and have noted 1.   The patient's medical and social history 2.   Their use of alcohol, tobacco or illicit drugs 3.   Their current medications and supplements 4.   The patient's functional ability including ADL's, fall risks, home safety risks and hearing or visual  impairment. 5.   Diet and physical activities 6.   Evidence for depression or mood disorders The patients weight, height, BMI and visual acuity have been recorded in the chart I have made referrals, counseling and provided education to the patient based review of the above and I have provided the pt with a written personalized care plan for preventive services.  Orders: Medicare -1st Annual Wellness Visit [phone])  Problem # 2:  DIABETES MELLITUS, TYPE II (ICD-250.00) Assessment: Deteriorated  His updated medication list for this problem includes:    Glimepiride 4 Mg Tabs (Glimepiride) .Marland Kitchen... 1 by mouth once daily  for diabetes    Metformin Hcl 1000 Mg Tabs (Metformin hcl) .Marland Kitchen... 1 by mouth two times a day for diabetes  Labs Reviewed: Creat: 1.4 (10/28/2010)    Reviewed HgBA1c results: 8.3 (10/28/2010)  8.3 (06/29/2010)  Problem # 3:  B12 DEFICIENCY (ICD-266.2) Assessment: Improved start Rx  Problem # 4:  DENTAL CARIES (ICD-521.00) Assessment: Deteriorated Amoxicillin when needed  Problem # 5:  INGUINAL HERNIA, UNILATERAL (ICD-550.90) R Assessment: New Surgery is pending at [facility]  Problem # 6:  HYPERTENSION (ICD-401.9) Assessment: Unchanged  His updated medication list for this problem includes:    Maxzide-25 37.5-25 Mg Tabs (Triamterene-hctz) ..... Once daily  BP today: 106/62 Prior BP: 112/80 (07/01/2010)  Labs Reviewed: K+: 4.1 (10/28/2010) Creat: : 1.4 (10/28/2010)   Chol: 176 (06/29/2010)   HDL: 42.90 (06/29/2010)   LDL: 110 (02/06/2008)   TG: 229.0 (06/29/2010)  Complete Medication List: 1)  Wellbutrin Sr 150 Mg Tb12 (Bupropion hcl) .... Once daily 2)  Maxzide-25 37.5-25 Mg Tabs (Triamterene-hctz) .... Once daily 3)  Klor-con M20 20 Meq Tbcr (Potassium chloride crys cr) .... Once daily 4)  Fish Oil Oil (Fish oil) .Marland Kitchen.. 1 by mouth bid 5)  Vitamin B-12 Cr 1000 Mcg Tbcr (Cyanocobalamin) .... Take one tablet by mouth daily 6)  Vitamin D3 1000 Unit Tabs (Cholecalciferol) .Marland Kitchen.. 1 qd 7)  Cialis 20 Mg Tabs (Tadalafil) .Marland Kitchen.. 1 tablet every other day as needed for erectile dysfunction 8)  Glimepiride 4 Mg Tabs (Glimepiride) .Marland Kitchen.. 1 by mouth once daily for diabetes 9)  Metformin Hcl 1000 Mg Tabs (Metformin hcl) .Marland Kitchen.. 1 by mouth two times a day for diabetes 10)  Hydrocodone-acetaminophen 5-325 Mg Tabs (Hydrocodone-acetaminophen) .Marland Kitchen.. 1 by mouth qid prn  Patient Instructions: 1)  Please schedule a follow-up appointment in 3 months. 2)  BMP prior to visit, ICD-9: 250.02 3)  HbgA1C prior to visit, ICD-9: 4)  Vit B12 266.20   Orders Added: 1)  Medicare -1st Annual Wellness Visit  [[id number]] 2)  Est. Patient Level IV [09811]

## 2010-11-14 LAB — CBC
Platelets: 184 10*3/uL (ref 150–400)
RDW: 13.4 % (ref 11.5–15.5)
WBC: 9.1 10*3/uL (ref 4.0–10.5)

## 2010-11-14 LAB — BASIC METABOLIC PANEL
Calcium: 9.8 mg/dL (ref 8.4–10.5)
Chloride: 102 mEq/L (ref 96–112)
Creatinine, Ser: 1.2 mg/dL (ref 0.4–1.5)
Sodium: 139 mEq/L (ref 135–145)

## 2010-11-21 LAB — GLUCOSE, CAPILLARY: Glucose-Capillary: 204 mg/dL — ABNORMAL HIGH (ref 70–99)

## 2010-12-07 IMAGING — CR DG CHEST 2V
2 series · 2 of 2 positions shown · non-contrast
Comparison: 03/13/2007

CLINICAL DATA: Pain.

CHEST - 2 VIEW

[w chest pa]
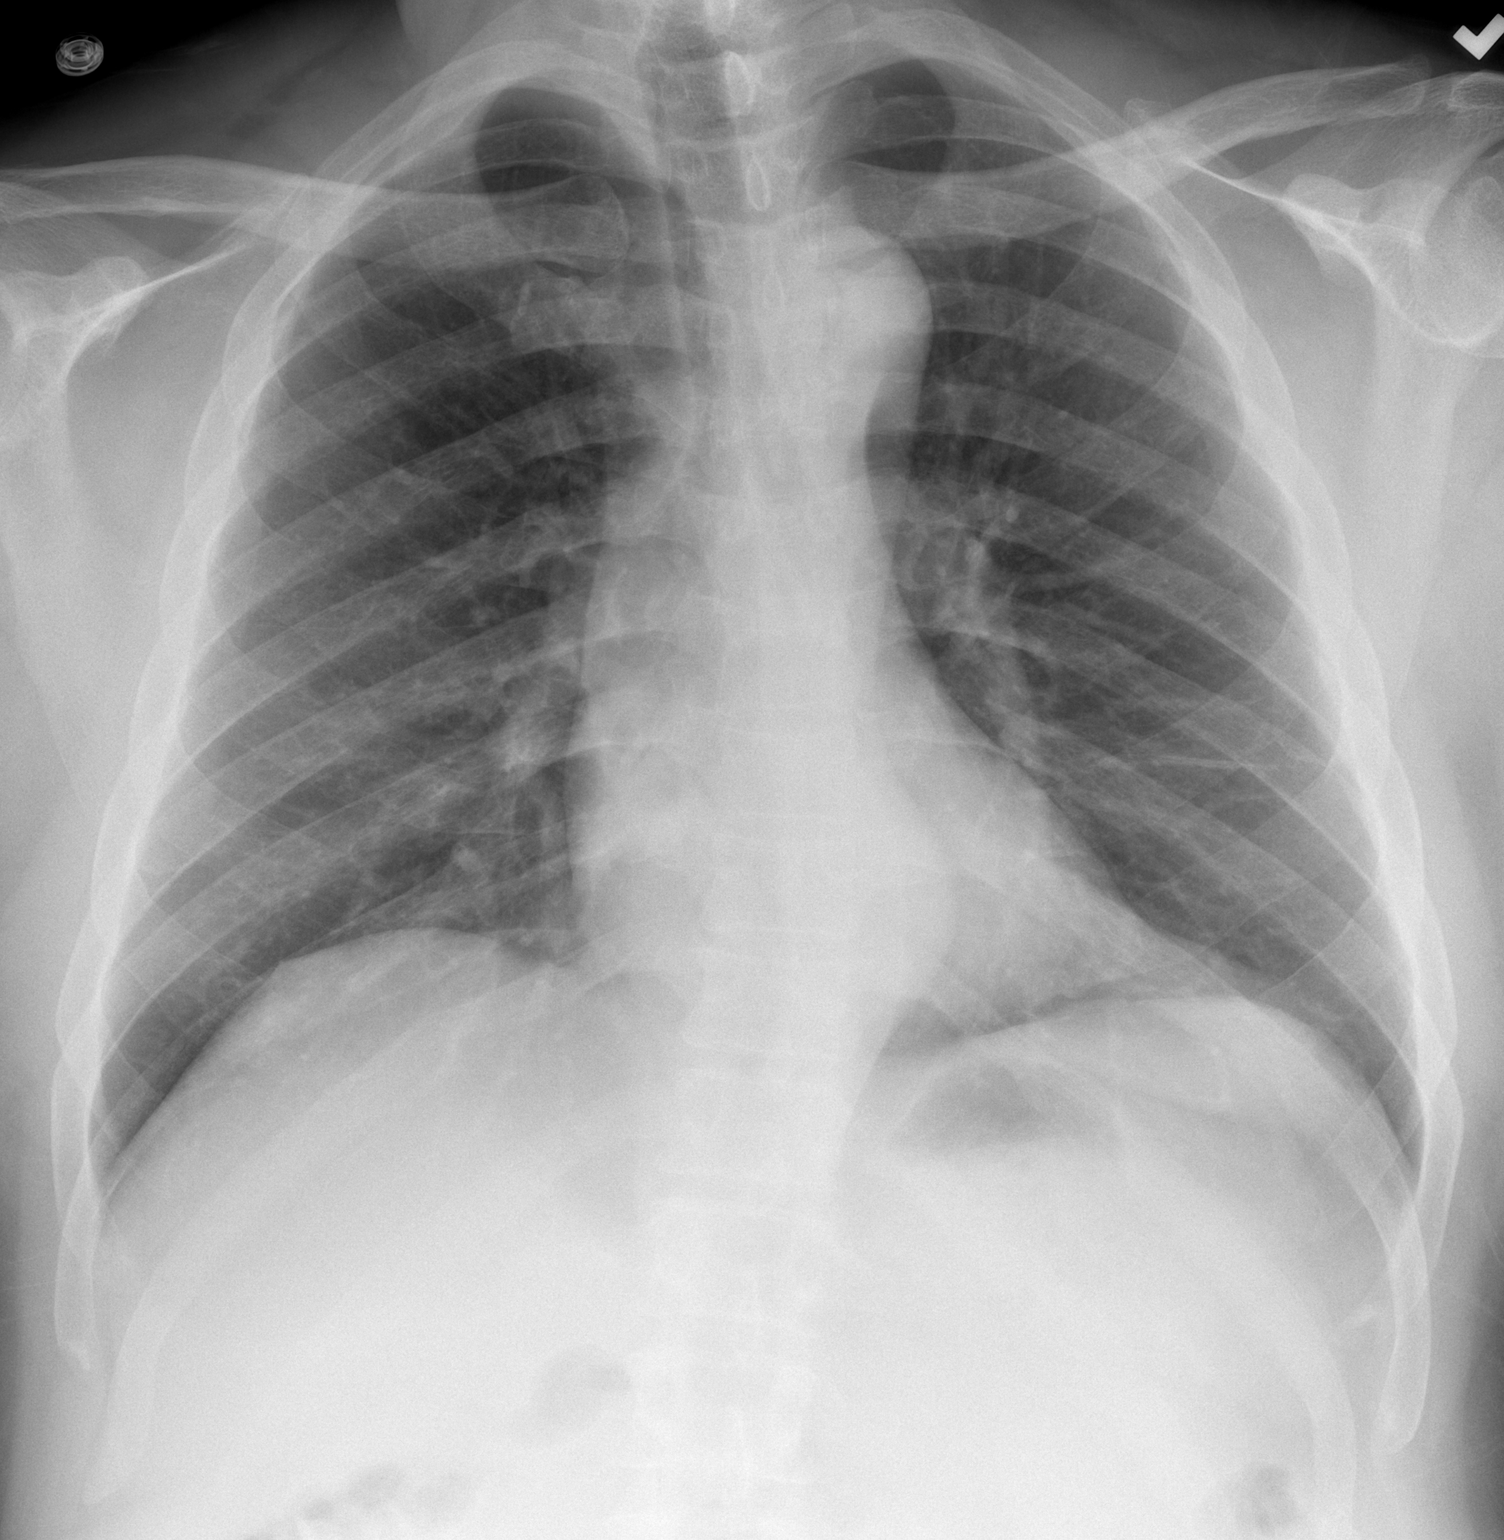

[w chest lat]
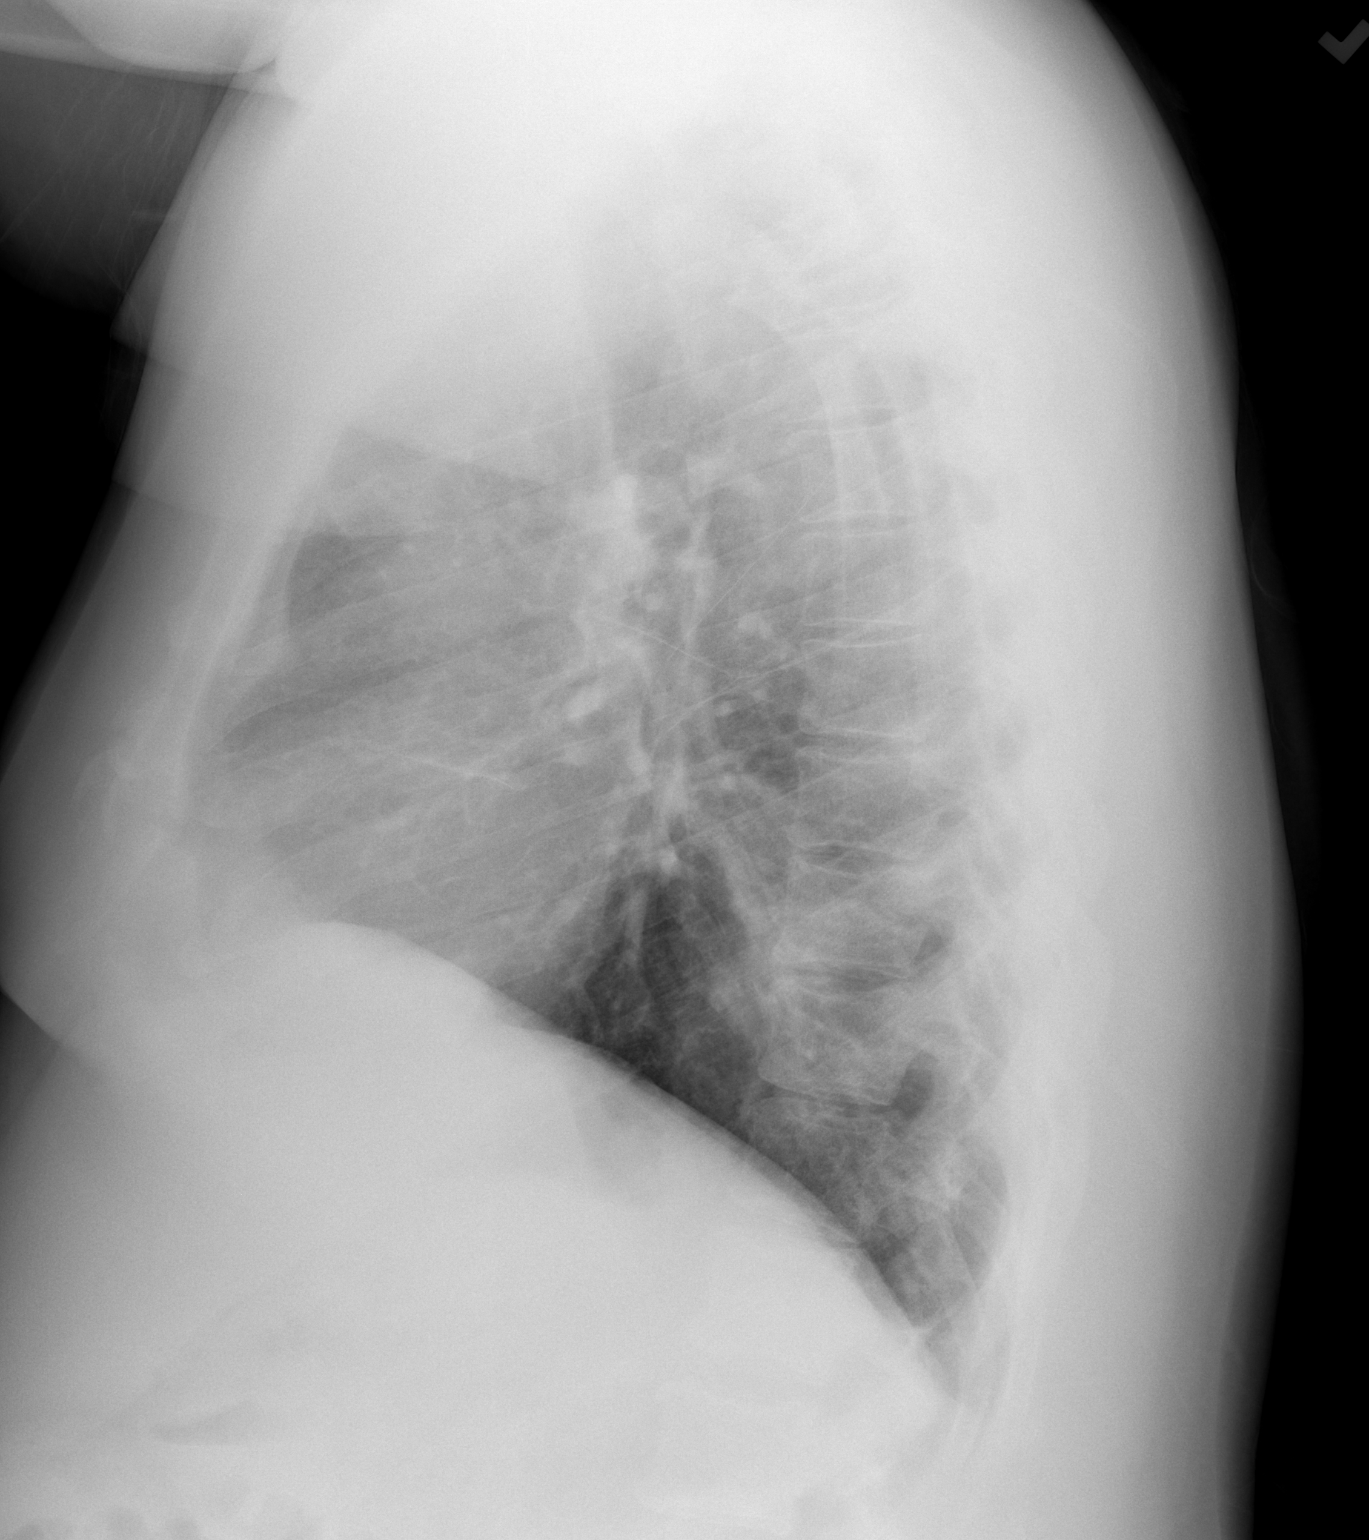

[2 of 2 positions shown; findings below may reference images not displayed]

FINDINGS: The cardiomediastinal silhouette is unremarkable.
Sub segmental scarring in the lower left lung is noted.
There is no evidence of focal airspace disease, pulmonary edema,
pleural effusion, or pneumothorax.
No acute bony abnormalities are identified.
IMPRESSION: No evidence of acute cardiopulmonary disease.

## 2010-12-28 ENCOUNTER — Telehealth: Payer: Self-pay | Admitting: Internal Medicine

## 2010-12-28 DIAGNOSIS — Z8719 Personal history of other diseases of the digestive system: Secondary | ICD-10-CM

## 2010-12-28 NOTE — Telephone Encounter (Signed)
Pt requests referral for home health nurse.

## 2010-12-29 NOTE — Telephone Encounter (Signed)
Pt left another mess - he just had surgery and is now home, req home health services referral.

## 2010-12-29 NOTE — Telephone Encounter (Signed)
Ok w/me What was the surgery? Thx

## 2010-12-29 NOTE — Telephone Encounter (Signed)
Sorry - hernia repair, please put in referral, thanks

## 2010-12-29 NOTE — Telephone Encounter (Signed)
ok 

## 2010-12-29 NOTE — Telephone Encounter (Signed)
Spoke w/pt - he would like referral to [facility] home [facility].

## 2011-01-18 NOTE — Telephone Encounter (Signed)
done

## 2011-01-19 NOTE — Telephone Encounter (Signed)
done

## 2011-01-21 ENCOUNTER — Other Ambulatory Visit: Payer: Self-pay | Admitting: Internal Medicine

## 2011-01-23 NOTE — Telephone Encounter (Signed)
Ok to Rf? Med is not active any longer.

## 2011-01-27 ENCOUNTER — Other Ambulatory Visit: Payer: Self-pay | Admitting: Internal Medicine

## 2011-01-27 ENCOUNTER — Other Ambulatory Visit: Payer: Medicare Other

## 2011-01-27 ENCOUNTER — Other Ambulatory Visit: Payer: Self-pay | Admitting: *Deleted

## 2011-01-27 DIAGNOSIS — E538 Deficiency of other specified B group vitamins: Secondary | ICD-10-CM

## 2011-01-27 MED ORDER — GLYBURIDE-METFORMIN 2.5-500 MG PO TABS: 1.0000 | ORAL_TABLET | Freq: Two times a day (BID) | ORAL | Status: DC

## 2011-01-27 NOTE — Telephone Encounter (Signed)
Ok 12 mo Thx 

## 2011-01-27 NOTE — Telephone Encounter (Signed)
rec rf req for Glyburide/metformin 2.5/500 1 po bid # 60. Last filled 12-25-10. Med is not active. Please advise

## 2011-02-03 ENCOUNTER — Encounter: Payer: Self-pay | Admitting: Internal Medicine

## 2011-02-03 ENCOUNTER — Ambulatory Visit (INDEPENDENT_AMBULATORY_CARE_PROVIDER_SITE_OTHER): Payer: Medicare Other | Admitting: Internal Medicine

## 2011-02-03 ENCOUNTER — Other Ambulatory Visit (INDEPENDENT_AMBULATORY_CARE_PROVIDER_SITE_OTHER): Payer: Medicare Other

## 2011-02-03 DIAGNOSIS — E538 Deficiency of other specified B group vitamins: Secondary | ICD-10-CM

## 2011-02-03 DIAGNOSIS — K029 Dental caries, unspecified: Secondary | ICD-10-CM

## 2011-02-03 DIAGNOSIS — I10 Essential (primary) hypertension: Secondary | ICD-10-CM

## 2011-02-03 DIAGNOSIS — E119 Type 2 diabetes mellitus without complications: Secondary | ICD-10-CM

## 2011-02-03 DIAGNOSIS — N400 Enlarged prostate without lower urinary tract symptoms: Secondary | ICD-10-CM

## 2011-02-03 DIAGNOSIS — M545 Low back pain: Secondary | ICD-10-CM

## 2011-02-03 LAB — BASIC METABOLIC PANEL
CO2: 30 mEq/L (ref 19–32)
Calcium: 9.7 mg/dL (ref 8.4–10.5)
GFR: 97.58 mL/min (ref >60.00)
Sodium: 140 mEq/L (ref 135–145)

## 2011-02-03 LAB — GLUCOSE, POCT (MANUAL RESULT ENTRY): POC Glucose: 191

## 2011-02-03 LAB — HEMOGLOBIN A1C: Hgb A1c MFr Bld: 7.6 % — ABNORMAL HIGH (ref 4.6–6.5)

## 2011-02-03 MED ORDER — HYDROCODONE-ACETAMINOPHEN 7.5-325 MG PO TABS: 1.0000 | ORAL_TABLET | Freq: Four times a day (QID) | ORAL | Status: DC | PRN

## 2011-02-03 NOTE — Assessment & Plan Note (Signed)
Worse following suurgery. Better now

## 2011-02-03 NOTE — Assessment & Plan Note (Signed)
Worse. On Rx

## 2011-02-03 NOTE — Assessment & Plan Note (Signed)
Letter  

## 2011-02-03 NOTE — Assessment & Plan Note (Signed)
Cont rx

## 2011-02-03 NOTE — Progress Notes (Signed)
  Subjective:    Patient ID: Timothy Schmidt, male    DOB: 1956-04-01, 55 y.o.   MRN: [id number]  HPI  C/o OA pains, LBP - worse He ad a hernia surgery F/u HTN, DM He had to have a cath followin surgery   Review of Systems  Constitutional: Positive for fatigue. Negative for appetite change and unexpected weight change.  HENT: Negative for nosebleeds, congestion, sore throat, sneezing, trouble swallowing and neck pain.   Eyes: Negative for itching and visual disturbance.  Respiratory: Negative for cough.   Cardiovascular: Negative for chest pain, palpitations and leg swelling.  Gastrointestinal: Negative for nausea, diarrhea, blood in stool and abdominal distention.  Genitourinary: Positive for difficulty urinating. Negative for frequency and hematuria.  Musculoskeletal: Positive for back pain, arthralgias and gait problem. Negative for joint swelling.  Skin: Negative for rash.  Neurological: Negative for dizziness, tremors, speech difficulty and weakness.  Psychiatric/Behavioral: Negative for sleep disturbance, dysphoric mood and agitation. The patient is not nervous/anxious.        Objective:   Physical Exam  Constitutional: He is oriented to person, place, and time. He appears well-developed.       Obese   HENT:  Mouth/Throat: Oropharynx is clear and moist.  Eyes: Conjunctivae are normal. Pupils are equal, round, and reactive to light.  Neck: Normal range of motion. No JVD present. No thyromegaly present.  Cardiovascular: Normal rate, regular rhythm, normal heart sounds and intact distal pulses.  Exam reveals no gallop and no friction rub.   No murmur heard. Pulmonary/Chest: Effort normal and breath sounds normal. No respiratory distress. He has no wheezes. He has no rales. He exhibits no tenderness.  Abdominal: Soft. Bowel sounds are normal. He exhibits no distension and no mass. There is no tenderness. There is no rebound and no guarding.  Musculoskeletal: Normal range of  motion. He exhibits tenderness. He exhibits no edema.  Lymphadenopathy:    He has no cervical adenopathy.  Neurological: He is alert and oriented to person, place, and time. He has normal reflexes. No cranial nerve deficit. He exhibits normal muscle tone. Coordination abnormal.       Using a cane  Skin: Skin is warm and dry. No rash noted.  Psychiatric: He has a normal mood and affect. His behavior is normal. Judgment and thought content normal.          Assessment & Plan:

## 2011-02-05 ENCOUNTER — Encounter: Payer: Self-pay | Admitting: Internal Medicine

## 2011-02-05 NOTE — Assessment & Plan Note (Signed)
Risks associated with treatment noncompliance were discussed. Compliance was encouraged. 

## 2011-02-06 LAB — VITAMIN B12: Vitamin B-12: 771 pg/mL (ref 211–911)

## 2011-02-08 ENCOUNTER — Other Ambulatory Visit: Payer: Self-pay | Admitting: *Deleted

## 2011-02-08 MED ORDER — GLYBURIDE-METFORMIN 2.5-500 MG PO TABS: 1.0000 | ORAL_TABLET | Freq: Two times a day (BID) | ORAL | Status: DC

## 2011-03-22 ENCOUNTER — Other Ambulatory Visit: Payer: Self-pay | Admitting: *Deleted

## 2011-03-22 MED ORDER — CALCIUM 1250 MG PO TABS: 1.0000 | ORAL_TABLET | Freq: Every day | ORAL | Status: DC

## 2011-03-22 MED ORDER — VITAMIN D 1000 UNITS PO TABS: 1000.0000 [IU] | ORAL_TABLET | Freq: Every day | ORAL | Status: AC

## 2011-03-22 MED ORDER — CYANOCOBALAMIN 1000 MCG PO TABS: 500.0000 ug | ORAL_TABLET | Freq: Every day | ORAL | Status: DC

## 2011-03-22 NOTE — Progress Notes (Signed)
Faxed RF req from pharm

## 2011-04-03 ENCOUNTER — Telehealth: Payer: Self-pay | Admitting: *Deleted

## 2011-04-03 NOTE — Telephone Encounter (Signed)
Pt left vm - "magnesium was left off" Unsure if this was labs or RX or other needed. Attempted to call pt - no answer & VM was not set up.

## 2011-04-05 NOTE — Telephone Encounter (Signed)
No answer, no vm x 3 attempts

## 2011-04-12 ENCOUNTER — Telehealth: Payer: Self-pay | Admitting: *Deleted

## 2011-04-12 NOTE — Telephone Encounter (Signed)
Pt called to say that he is taking Magnesium with chelated zinc 400 mg 1 po qd.

## 2011-04-13 ENCOUNTER — Telehealth: Payer: Self-pay | Admitting: *Deleted

## 2011-04-13 NOTE — Telephone Encounter (Signed)
Mailed pt copy of med list per his req. Stacey faxed med list to his pharmacy yesterday but they did not receive it per pt so he req it to be mailed.

## 2011-04-13 NOTE — Telephone Encounter (Signed)
Noted. Thx.

## 2011-04-18 ENCOUNTER — Other Ambulatory Visit: Payer: Self-pay | Admitting: Internal Medicine

## 2011-04-21 ENCOUNTER — Other Ambulatory Visit: Payer: Self-pay | Admitting: Internal Medicine

## 2011-05-05 ENCOUNTER — Ambulatory Visit (INDEPENDENT_AMBULATORY_CARE_PROVIDER_SITE_OTHER): Payer: Medicare Other

## 2011-05-05 ENCOUNTER — Ambulatory Visit (INDEPENDENT_AMBULATORY_CARE_PROVIDER_SITE_OTHER): Payer: Medicare Other | Admitting: Internal Medicine

## 2011-05-05 ENCOUNTER — Encounter: Payer: Self-pay | Admitting: Internal Medicine

## 2011-05-05 VITALS — BP 130/68 | HR 76 | Temp 99.1°F | Resp 16 | Wt 212.0 lb

## 2011-05-05 DIAGNOSIS — E119 Type 2 diabetes mellitus without complications: Secondary | ICD-10-CM

## 2011-05-05 DIAGNOSIS — E538 Deficiency of other specified B group vitamins: Secondary | ICD-10-CM

## 2011-05-05 DIAGNOSIS — K029 Dental caries, unspecified: Secondary | ICD-10-CM

## 2011-05-05 DIAGNOSIS — M545 Low back pain, unspecified: Secondary | ICD-10-CM

## 2011-05-05 DIAGNOSIS — I10 Essential (primary) hypertension: Secondary | ICD-10-CM

## 2011-05-05 DIAGNOSIS — F329 Major depressive disorder, single episode, unspecified: Secondary | ICD-10-CM

## 2011-05-05 DIAGNOSIS — N400 Enlarged prostate without lower urinary tract symptoms: Secondary | ICD-10-CM

## 2011-05-05 DIAGNOSIS — E669 Obesity, unspecified: Secondary | ICD-10-CM

## 2011-05-05 MED ORDER — PENICILLIN V POTASSIUM 500 MG PO TABS: 500.0000 mg | ORAL_TABLET | Freq: Four times a day (QID) | ORAL | Status: DC

## 2011-05-05 NOTE — Assessment & Plan Note (Signed)
Diet, wt loss discussed

## 2011-05-05 NOTE — Assessment & Plan Note (Signed)
Continue with current prescription therapy as reflected on the Med list.  

## 2011-05-05 NOTE — Assessment & Plan Note (Signed)
Extensive. He hasn't come up with money yet. He has to see a dentist Pen [facility]K

## 2011-05-05 NOTE — Progress Notes (Signed)
  Subjective:    Patient ID: Timothy Schmidt, male    DOB: 21-Aug-1956, 55 y.o.   MRN: [id number]  HPI  The patient presents for a follow-up of  chronic hypertension, chronic dyslipidemia, type 2 diabetes controlled with medicines C/o caries - not better    Review of Systems  Constitutional: Negative for appetite change, fatigue and unexpected weight change.  HENT: Negative for nosebleeds, congestion, sore throat, sneezing, trouble swallowing and neck pain.   Eyes: Negative for itching and visual disturbance.  Respiratory: Negative for cough.   Cardiovascular: Negative for chest pain, palpitations and leg swelling.  Gastrointestinal: Negative for nausea, diarrhea, blood in stool and abdominal distention.  Genitourinary: Negative for frequency and hematuria.  Musculoskeletal: Positive for back pain and gait problem. Negative for joint swelling.  Skin: Negative for rash.  Neurological: Negative for dizziness, tremors, speech difficulty and weakness.  Psychiatric/Behavioral: Negative for sleep disturbance, dysphoric mood and agitation. The patient is not nervous/anxious.    Wt Readings from Last 3 Encounters:  05/05/11 212 lb (96.163 kg)  02/03/11 206 lb (93.441 kg)  10/31/10 214 lb (97.07 kg)       Objective:   Physical Exam  Constitutional: He is oriented to person, place, and time. He appears well-developed.       Obese  HENT:  Mouth/Throat: Oropharynx is clear and moist.  Eyes: Conjunctivae are normal. Pupils are equal, round, and reactive to light.  Neck: Normal range of motion. No JVD present. No thyromegaly present.  Cardiovascular: Normal rate, regular rhythm, normal heart sounds and intact distal pulses.  Exam reveals no gallop and no friction rub.   No murmur heard. Pulmonary/Chest: Effort normal and breath sounds normal. No respiratory distress. He has no wheezes. He has no rales. He exhibits no tenderness.  Abdominal: Soft. Bowel sounds are normal. He exhibits no  distension and no mass. There is no tenderness. There is no rebound and no guarding.  Musculoskeletal: Normal range of motion. He exhibits no edema and no tenderness.  Lymphadenopathy:    He has no cervical adenopathy.  Neurological: He is alert and oriented to person, place, and time. He has normal reflexes. No cranial nerve deficit. He exhibits normal muscle tone. Coordination normal.  Skin: Skin is warm and dry. No rash noted.  Psychiatric: He has a normal mood and affect. His behavior is normal. Judgment and thought content normal.      Lab Results  Component Value Date   WBC 9.1 09/22/2010   WBC 9.1 09/22/2010   HGB 14.9 09/22/2010   HGB 14.9 09/22/2010   HCT 41.8 09/22/2010   HCT 41.8 09/22/2010   PLT 184 09/22/2010   PLT 184 09/22/2010   CHOL 176 06/29/2010   TRIG 229.0* 06/29/2010   HDL 42.90 06/29/2010   LDLDIRECT 71.5 06/29/2010   ALT 21 06/29/2010   AST 17 06/29/2010   NA 140 02/03/2011   K 3.4* 02/03/2011   CL 103 02/03/2011   CREATININE 1.0 02/03/2011   BUN 12 02/03/2011   CO2 30 02/03/2011   TSH 0.88 06/29/2010   PSA 1.71 10/28/2010   HGBA1C 7.6* 02/03/2011       Assessment & Plan:

## 2011-05-08 ENCOUNTER — Telehealth: Payer: Self-pay | Admitting: Internal Medicine

## 2011-05-08 LAB — COMPREHENSIVE METABOLIC PANEL
ALT: 26 U/L (ref 0–53)
AST: 21 U/L (ref 0–37)
Alkaline Phosphatase: 55 U/L (ref 39–117)
Calcium: 10.3 mg/dL (ref 8.4–10.5)
Chloride: 104 mEq/L (ref 96–112)
Creatinine, Ser: 1.3 mg/dL (ref 0.4–1.5)
Total Bilirubin: 0.6 mg/dL (ref 0.3–1.2)

## 2011-05-08 NOTE — Telephone Encounter (Signed)
Timothy Schmidt , please, inform the patient: labs are OK, except for poor sugar control. Loose 10 lbs and eat better   Please, keep  next office visit appointment.   Thank you !

## 2011-05-09 NOTE — Telephone Encounter (Signed)
Pt informed

## 2011-05-10 ENCOUNTER — Telehealth: Payer: Self-pay | Admitting: *Deleted

## 2011-05-10 DIAGNOSIS — E1165 Type 2 diabetes mellitus with hyperglycemia: Secondary | ICD-10-CM

## 2011-05-10 NOTE — Telephone Encounter (Signed)
Pt is calling req me to mail a copy to his home. Done  He is requesting referral for nutritionist  to help him with his diet and meals. Please advise

## 2011-05-10 NOTE — Telephone Encounter (Signed)
Done thx

## 2011-06-13 ENCOUNTER — Telehealth: Payer: Self-pay | Admitting: *Deleted

## 2011-06-13 NOTE — Telephone Encounter (Signed)
Pt left vm req call back re: disability letter/update.

## 2011-06-14 NOTE — Telephone Encounter (Signed)
Spoke to pt- he states he has a form that needs to be completed. I advised pt he needs appt/transferred to scheduler.

## 2011-07-07 ENCOUNTER — Ambulatory Visit: Payer: Medicare Other | Admitting: Internal Medicine

## 2011-07-11 ENCOUNTER — Ambulatory Visit: Payer: Medicare Other | Admitting: Internal Medicine

## 2011-07-18 ENCOUNTER — Other Ambulatory Visit: Payer: Self-pay | Admitting: Internal Medicine

## 2011-07-26 ENCOUNTER — Other Ambulatory Visit: Payer: Self-pay | Admitting: Internal Medicine

## 2011-08-04 ENCOUNTER — Encounter: Payer: Self-pay | Admitting: Internal Medicine

## 2011-08-04 ENCOUNTER — Other Ambulatory Visit (INDEPENDENT_AMBULATORY_CARE_PROVIDER_SITE_OTHER): Payer: Medicare Other

## 2011-08-04 ENCOUNTER — Ambulatory Visit (INDEPENDENT_AMBULATORY_CARE_PROVIDER_SITE_OTHER): Payer: Medicare Other | Admitting: Internal Medicine

## 2011-08-04 VITALS — BP 120/90 | HR 80 | Temp 99.0°F | Resp 16 | Wt 211.0 lb

## 2011-08-04 DIAGNOSIS — M1990 Unspecified osteoarthritis, unspecified site: Secondary | ICD-10-CM

## 2011-08-04 DIAGNOSIS — F329 Major depressive disorder, single episode, unspecified: Secondary | ICD-10-CM

## 2011-08-04 DIAGNOSIS — E538 Deficiency of other specified B group vitamins: Secondary | ICD-10-CM

## 2011-08-04 DIAGNOSIS — E119 Type 2 diabetes mellitus without complications: Secondary | ICD-10-CM

## 2011-08-04 DIAGNOSIS — F3289 Other specified depressive episodes: Secondary | ICD-10-CM

## 2011-08-04 DIAGNOSIS — M545 Low back pain: Secondary | ICD-10-CM

## 2011-08-04 LAB — CBC WITH DIFFERENTIAL/PLATELET
Basophils Absolute: 0 10*3/uL (ref 0.0–0.1)
HCT: 46 % (ref 39.0–52.0)
Lymphs Abs: 2.9 10*3/uL (ref 0.7–4.0)
MCV: 93 fl (ref 78.0–100.0)
Monocytes Absolute: 0.8 10*3/uL (ref 0.1–1.0)
Monocytes Relative: 8.9 % (ref 3.0–12.0)
Neutrophils Relative %: 58.7 % (ref 43.0–77.0)
Platelets: 239 10*3/uL (ref 150.0–400.0)
RDW: 14.6 % (ref 11.5–14.6)

## 2011-08-04 LAB — COMPREHENSIVE METABOLIC PANEL
ALT: 27 U/L (ref 0–53)
Albumin: 4.3 g/dL (ref 3.5–5.2)
CO2: 29 mEq/L (ref 19–32)
GFR: 74.28 mL/min (ref >60.00)
Glucose, Bld: 149 mg/dL — ABNORMAL HIGH (ref 70–99)
Potassium: 3.5 mEq/L (ref 3.5–5.1)
Sodium: 143 mEq/L (ref 135–145)
Total Bilirubin: 0.4 mg/dL (ref 0.3–1.2)
Total Protein: 8 g/dL (ref 6.0–8.3)

## 2011-08-04 LAB — HEMOGLOBIN A1C: Hgb A1c MFr Bld: 8.8 % — ABNORMAL HIGH (ref 4.6–6.5)

## 2011-08-04 NOTE — Assessment & Plan Note (Signed)
Endocr consult offered Continue with current prescription therapy as reflected on the Med list. Loose wt

## 2011-08-04 NOTE — Assessment & Plan Note (Signed)
LS spine, knees OTC meds

## 2011-08-04 NOTE — Assessment & Plan Note (Signed)
Chronic MSK Continue with current prescription/OTC therapy as reflected on the Med list.

## 2011-08-04 NOTE — Progress Notes (Signed)
  Subjective:    Patient ID: Timothy Schmidt, male    DOB: 02/25/1956, 55 y.o.   MRN: [id number]  HPI  The patient presents for a follow-up of  chronic hypertension, chronic dyslipidemia, type 2 diabetes controlled with medicines     Review of Systems  Constitutional: Negative for appetite change, fatigue and unexpected weight change.  HENT: Negative for nosebleeds, congestion, sore throat, sneezing, trouble swallowing and neck pain.   Eyes: Negative for itching and visual disturbance.  Respiratory: Negative for cough.   Cardiovascular: Negative for chest pain, palpitations and leg swelling.  Gastrointestinal: Negative for nausea, diarrhea, blood in stool and abdominal distention.  Genitourinary: Negative for frequency and hematuria.  Musculoskeletal: Positive for back pain and arthralgias. Negative for joint swelling and gait problem.  Skin: Negative for rash.  Neurological: Negative for dizziness, tremors, speech difficulty and weakness.  Psychiatric/Behavioral: Negative for suicidal ideas, sleep disturbance, dysphoric mood and agitation. The patient is not nervous/anxious.    Wt Readings from Last 3 Encounters:  08/04/11 211 lb (95.709 kg)  05/05/11 212 lb (96.163 kg)  02/03/11 206 lb (93.441 kg)       Objective:   Physical Exam  Constitutional: He is oriented to person, place, and time. He appears well-developed.  HENT:  Mouth/Throat: Oropharynx is clear and moist.  Eyes: Conjunctivae are normal. Pupils are equal, round, and reactive to light.  Neck: Normal range of motion. No JVD present. No thyromegaly present.  Cardiovascular: Normal rate, regular rhythm, normal heart sounds and intact distal pulses.  Exam reveals no gallop and no friction rub.   No murmur heard. Pulmonary/Chest: Effort normal and breath sounds normal. No respiratory distress. He has no wheezes. He has no rales. He exhibits no tenderness.  Abdominal: Soft. Bowel sounds are normal. He exhibits no  distension and no mass. There is no tenderness. There is no rebound and no guarding.  Musculoskeletal: Normal range of motion. He exhibits no edema and no tenderness.  Lymphadenopathy:    He has no cervical adenopathy.  Neurological: He is alert and oriented to person, place, and time. He has normal reflexes. No cranial nerve deficit. He exhibits normal muscle tone. Coordination normal.  Skin: Skin is warm and dry. No rash noted.  Psychiatric: He has a normal mood and affect. His behavior is normal. Judgment and thought content normal.  Caries - diffuse        Assessment & Plan:

## 2011-08-04 NOTE — Assessment & Plan Note (Signed)
Continue with current prescription therapy as reflected on the Med list.  

## 2011-08-07 ENCOUNTER — Telehealth: Payer: Self-pay | Admitting: Internal Medicine

## 2011-08-07 NOTE — Telephone Encounter (Signed)
Misty Stanley, please, inform patient that his sugar control is worse Thx

## 2011-08-08 NOTE — Telephone Encounter (Signed)
Pt informed

## 2011-08-08 NOTE — Telephone Encounter (Signed)
Left mess for patient to call back.  

## 2011-10-09 ENCOUNTER — Telehealth: Payer: Self-pay
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
## 2011-10-10 ENCOUNTER — Telehealth: Payer: Self-pay | Admitting: *Deleted

## 2011-10-10 MED ORDER — HYDROCODONE-ACETAMINOPHEN 7.5-325 MG PO TABS: 1.0000 | ORAL_TABLET | Freq: Four times a day (QID) | ORAL | Status: DC | PRN

## 2011-10-10 NOTE — Telephone Encounter (Signed)
Addended by: Merrilyn Puma on: 10/10/2011 06:10 PM   Modules accepted: Orders

## 2011-10-10 NOTE — Telephone Encounter (Signed)
Rf req for Hydroco/APAP [id number] po qid prn pain. # 120. Ok to Rf?

## 2011-10-10 NOTE — Telephone Encounter (Signed)
OK to fill this prescription with additional refills x0 Thank you!  

## 2011-10-13 ENCOUNTER — Telehealth: Payer: Self-pay | Admitting: *Deleted

## 2011-10-13 NOTE — Telephone Encounter (Signed)
Not sure what he means Thx

## 2011-10-13 NOTE — Telephone Encounter (Signed)
He means he wants a pain medication w/o APAP.

## 2011-10-13 NOTE — Telephone Encounter (Signed)
Pt calling requesting his Norco be changed. The copay for this med went from $6- $40. He states the pharmacist advised him to have it changed to a med that is compounded once OR with one med not two. Please advise.

## 2011-10-16 MED ORDER — TRAMADOL HCL 50 MG PO TABS: 50.0000 mg | ORAL_TABLET | Freq: Two times a day (BID) | ORAL | Status: DC | PRN

## 2011-10-16 NOTE — Telephone Encounter (Signed)
Ok tramadol 

## 2011-10-16 NOTE — Telephone Encounter (Signed)
Pt informed

## 2011-10-20 ENCOUNTER — Other Ambulatory Visit: Payer: Self-pay | Admitting: Internal Medicine

## 2011-11-10 ENCOUNTER — Encounter: Payer: Self-pay | Admitting: Internal Medicine

## 2011-11-10 ENCOUNTER — Ambulatory Visit (INDEPENDENT_AMBULATORY_CARE_PROVIDER_SITE_OTHER): Payer: Medicare Other | Admitting: Internal Medicine

## 2011-11-10 ENCOUNTER — Other Ambulatory Visit (INDEPENDENT_AMBULATORY_CARE_PROVIDER_SITE_OTHER): Payer: Medicare Other

## 2011-11-10 VITALS — BP 110/70 | HR 80 | Temp 99.2°F | Resp 16 | Wt 212.5 lb

## 2011-11-10 DIAGNOSIS — E538 Deficiency of other specified B group vitamins: Secondary | ICD-10-CM

## 2011-11-10 DIAGNOSIS — E119 Type 2 diabetes mellitus without complications: Secondary | ICD-10-CM

## 2011-11-10 DIAGNOSIS — K029 Dental caries, unspecified: Secondary | ICD-10-CM

## 2011-11-10 DIAGNOSIS — F79 Unspecified intellectual disabilities: Secondary | ICD-10-CM

## 2011-11-10 LAB — BASIC METABOLIC PANEL
CO2: 27 mEq/L (ref 19–32)
Chloride: 101 mEq/L (ref 96–112)
Glucose, Bld: 232 mg/dL — ABNORMAL HIGH (ref 70–99)
Sodium: 139 mEq/L (ref 135–145)

## 2011-11-10 MED ORDER — ACETAMINOPHEN-CODEINE #2 300-15 MG PO TABS: 1.0000 | ORAL_TABLET | Freq: Two times a day (BID) | ORAL | Status: DC | PRN

## 2011-11-10 NOTE — Assessment & Plan Note (Signed)
Chronic - determined by SS - disabled since 1992 SS will manage his finances

## 2011-11-10 NOTE — Assessment & Plan Note (Signed)
Continue with current prescription therapy as reflected on the Med list.  

## 2011-11-10 NOTE — Assessment & Plan Note (Signed)
appt pending in 4/13

## 2011-11-10 NOTE — Progress Notes (Signed)
Patient ID: Timothy Schmidt, male   DOB: Sep 17, 1955, 56 y.o.   MRN: [id number]  Subjective:    Patient ID: Timothy Schmidt, male    DOB: 1955/09/23, 56 y.o.   MRN: [id number]  HPI  The patient presents for a follow-up of  chronic hypertension, chronic dyslipidemia, type 2 diabetes controlled with medicines F/u LBP/OA - worse     Review of Systems  Constitutional: Negative for appetite change, fatigue and unexpected weight change.  HENT: Negative for nosebleeds, congestion, sore throat, sneezing, trouble swallowing and neck pain.   Eyes: Negative for itching and visual disturbance.  Respiratory: Negative for cough.   Cardiovascular: Negative for chest pain, palpitations and leg swelling.  Gastrointestinal: Negative for nausea, diarrhea, blood in stool and abdominal distention.  Genitourinary: Negative for frequency and hematuria.  Musculoskeletal: Positive for back pain and arthralgias. Negative for joint swelling and gait problem.  Skin: Negative for rash.  Neurological: Negative for dizziness, tremors, speech difficulty and weakness.  Psychiatric/Behavioral: Negative for suicidal ideas, sleep disturbance, dysphoric mood and agitation. The patient is not nervous/anxious.    Wt Readings from Last 3 Encounters:  11/10/11 212 lb 8 oz (96.389 kg)  08/04/11 211 lb (95.709 kg)  05/05/11 212 lb (96.163 kg)   BP Readings from Last 3 Encounters:  11/10/11 110/70  08/04/11 120/90  05/05/11 130/68        Objective:   Physical Exam  Constitutional: He is oriented to person, place, and time. He appears well-developed.  HENT:  Mouth/Throat: Oropharynx is clear and moist.  Eyes: Conjunctivae are normal. Pupils are equal, round, and reactive to light.  Neck: Normal range of motion. No JVD present. No thyromegaly present.  Cardiovascular: Normal rate, regular rhythm, normal heart sounds and intact distal pulses.  Exam reveals no gallop and no friction rub.   No murmur  heard. Pulmonary/Chest: Effort normal and breath sounds normal. No respiratory distress. He has no wheezes. He has no rales. He exhibits no tenderness.  Abdominal: Soft. Bowel sounds are normal. He exhibits no distension and no mass. There is no tenderness. There is no rebound and no guarding.  Musculoskeletal: Normal range of motion. He exhibits no edema and no tenderness.  Lymphadenopathy:    He has no cervical adenopathy.  Neurological: He is alert and oriented to person, place, and time. He has normal reflexes. No cranial nerve deficit. He exhibits normal muscle tone. Coordination normal.  Skin: Skin is warm and dry. No rash noted.  Psychiatric: He has a normal mood and affect. His behavior is normal. Judgment and thought content normal.  Caries - diffuse   Lab Results  Component Value Date   WBC 9.4 08/04/2011   HGB 15.6 08/04/2011   HCT 46.0 08/04/2011   PLT 239.0 08/04/2011   GLUCOSE 149* 08/04/2011   CHOL 176 06/29/2010   TRIG 229.0* 06/29/2010   HDL 42.90 06/29/2010   LDLDIRECT 71.5 06/29/2010   LDLCALC 110* 02/06/2008   ALT 27 08/04/2011   AST 18 08/04/2011   NA 143 08/04/2011   K 3.5 08/04/2011   CL 106 08/04/2011   CREATININE 1.3 08/04/2011   BUN 15 08/04/2011   CO2 29 08/04/2011   TSH 0.97 05/05/2011   PSA 1.71 10/28/2010   HGBA1C 8.8* 08/04/2011        Assessment & Plan:

## 2011-11-13 ENCOUNTER — Telehealth: Payer: Self-pay | Admitting: Internal Medicine

## 2011-11-13 NOTE — Telephone Encounter (Signed)
Timothy Schmidt, please, inform patient that all labs are worse Needs to loose wt and eat better Thx

## 2011-11-14 NOTE — Telephone Encounter (Signed)
Pt informed

## 2011-11-27 ENCOUNTER — Encounter: Payer: Self-pay | Admitting: Internal Medicine

## 2012-01-24 IMAGING — CR DG CHEST 2V
2 series · 2 of 2 positions shown · non-contrast
Comparison: 08/05/2009

CLINICAL DATA: Shoulder spasm was obtained.

CHEST - 2 VIEW

[w chest pa]
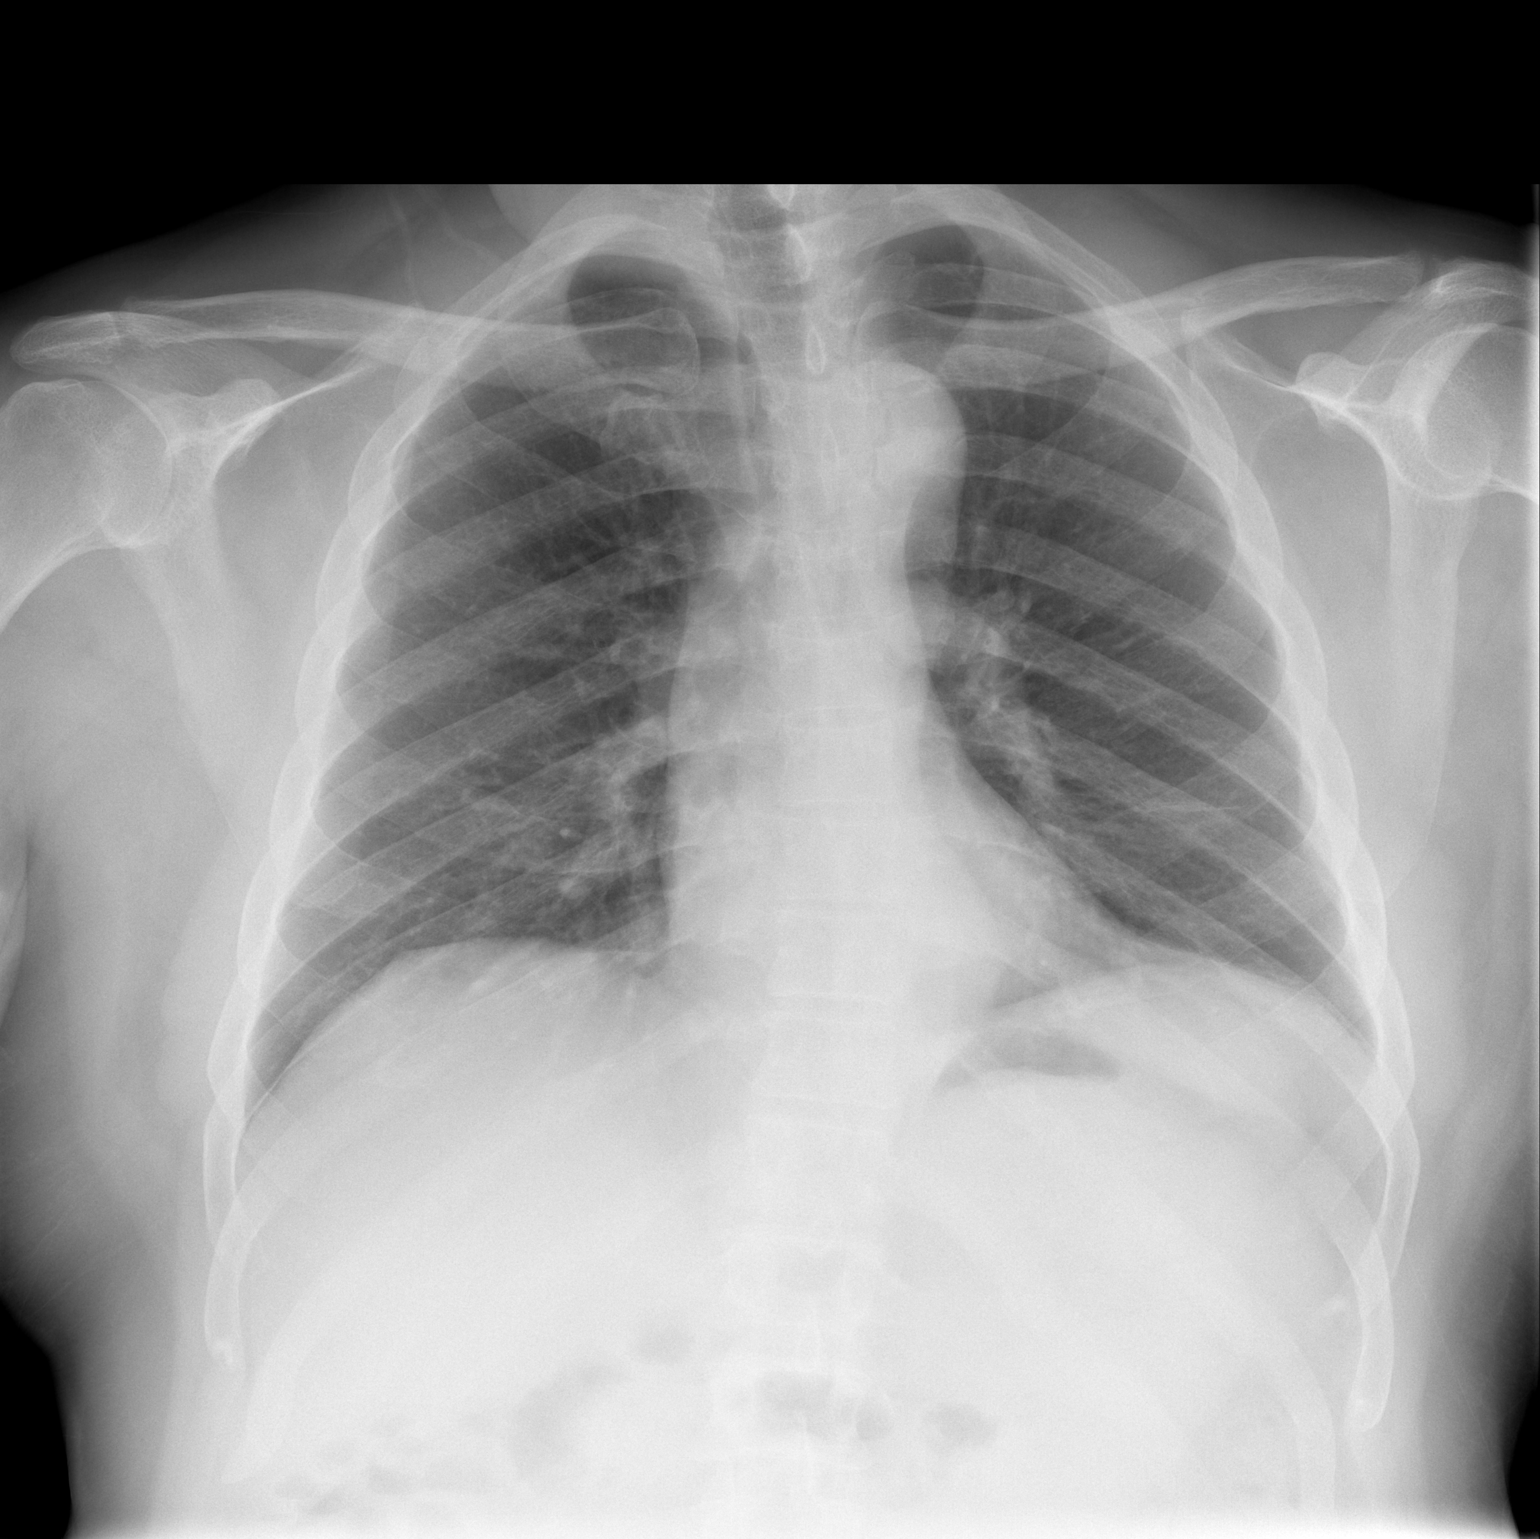

[w chest lat]
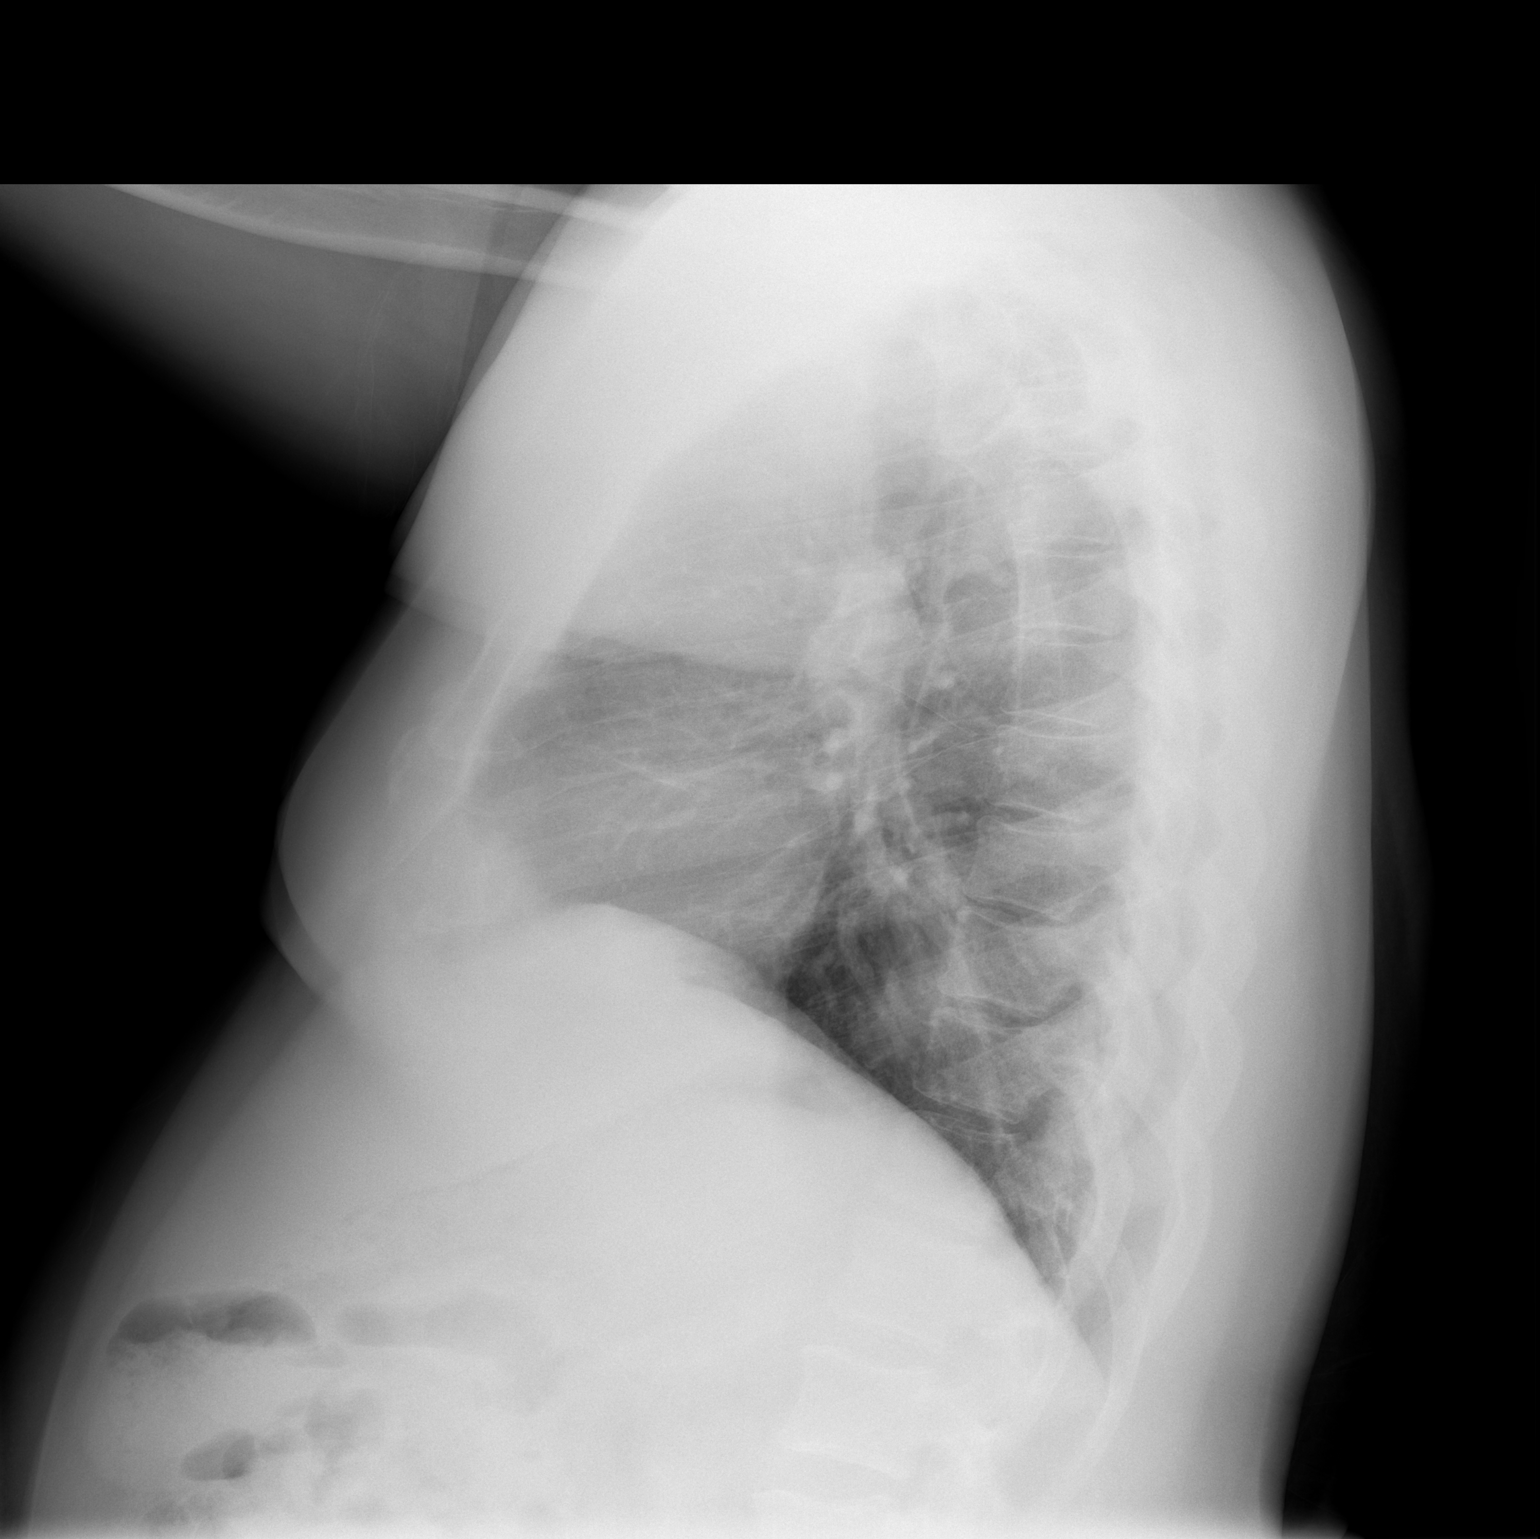

[2 of 2 positions shown; findings below may reference images not displayed]

FINDINGS: Normal heart size.  Linear scar at the left base.  Right
lung is clear.  No pneumothorax or pleural effusion.  No acute bony
deformity.
IMPRESSION: No active cardiopulmonary disease.

## 2012-02-16 ENCOUNTER — Ambulatory Visit (INDEPENDENT_AMBULATORY_CARE_PROVIDER_SITE_OTHER): Payer: Medicare Other | Admitting: Internal Medicine

## 2012-02-16 ENCOUNTER — Encounter: Payer: Self-pay | Admitting: Internal Medicine

## 2012-02-16 ENCOUNTER — Other Ambulatory Visit (INDEPENDENT_AMBULATORY_CARE_PROVIDER_SITE_OTHER): Payer: Medicare Other

## 2012-02-16 VITALS — BP 140/82 | HR 80 | Temp 98.7°F | Resp 16 | Wt 207.0 lb

## 2012-02-16 DIAGNOSIS — E669 Obesity, unspecified: Secondary | ICD-10-CM

## 2012-02-16 DIAGNOSIS — I10 Essential (primary) hypertension: Secondary | ICD-10-CM

## 2012-02-16 DIAGNOSIS — E119 Type 2 diabetes mellitus without complications: Secondary | ICD-10-CM

## 2012-02-16 DIAGNOSIS — J329 Chronic sinusitis, unspecified: Secondary | ICD-10-CM

## 2012-02-16 DIAGNOSIS — K029 Dental caries, unspecified: Secondary | ICD-10-CM

## 2012-02-16 DIAGNOSIS — E538 Deficiency of other specified B group vitamins: Secondary | ICD-10-CM

## 2012-02-16 DIAGNOSIS — M545 Low back pain, unspecified: Secondary | ICD-10-CM

## 2012-02-16 LAB — BASIC METABOLIC PANEL
Calcium: 9.6 mg/dL (ref 8.4–10.5)
GFR: 71.57 mL/min (ref >60.00)
Glucose, Bld: 93 mg/dL (ref 70–99)
Potassium: 3.6 mEq/L (ref 3.5–5.1)
Sodium: 139 mEq/L (ref 135–145)

## 2012-02-16 LAB — HEMOGLOBIN A1C: Hgb A1c MFr Bld: 8.4 % — ABNORMAL HIGH (ref 4.6–6.5)

## 2012-02-16 MED ORDER — DOXYCYCLINE HYCLATE 100 MG PO TABS: 100.0000 mg | ORAL_TABLET | Freq: Two times a day (BID) | ORAL | Status: AC

## 2012-02-16 NOTE — Assessment & Plan Note (Signed)
Continue with current prescription therapy as reflected on the Med list.  

## 2012-02-16 NOTE — Progress Notes (Signed)
  Subjective:    Patient ID: Timothy Schmidt, male    DOB: [birth year], 56 y.o.   MRN: [id number]  HPI  The patient presents for a follow-up of  chronic hypertension, chronic dyslipidemia, type 2 diabetes, ED controlled with medicines.  C/o sinus cong and white d/c x 2 wks F/u LBP/OA - worse     Review of Systems  Constitutional: Negative for appetite change, fatigue and unexpected weight change.  HENT: Negative for nosebleeds, congestion, sore throat, sneezing, trouble swallowing and neck pain.   Eyes: Negative for itching and visual disturbance.  Respiratory: Negative for cough.   Cardiovascular: Negative for chest pain, palpitations and leg swelling.  Gastrointestinal: Negative for nausea, diarrhea, blood in stool and abdominal distention.  Genitourinary: Negative for frequency and hematuria.  Musculoskeletal: Positive for back pain and arthralgias. Negative for joint swelling and gait problem.  Skin: Negative for rash.  Neurological: Negative for dizziness, tremors, speech difficulty and weakness.  Psychiatric/Behavioral: Negative for suicidal ideas, disturbed wake/sleep cycle, dysphoric mood and agitation. The patient is not nervous/anxious.    Wt Readings from Last 3 Encounters:  02/16/12 207 lb (93.895 kg)  11/10/11 212 lb 8 oz (96.389 kg)  08/04/11 211 lb (95.709 kg)   BP Readings from Last 3 Encounters:  02/16/12 140/82  11/10/11 110/70  08/04/11 120/90        Objective:   Physical Exam  Constitutional: He is oriented to person, place, and time. He appears well-developed.  HENT:  Mouth/Throat: Oropharynx is clear and moist.       Swollen nasal mucosa  Eyes: Conjunctivae are normal. Pupils are equal, round, and reactive to light.  Neck: Normal range of motion. No JVD present. No thyromegaly present.  Cardiovascular: Normal rate, regular rhythm, normal heart sounds and intact distal pulses.  Exam reveals no gallop and no friction rub.   No murmur  heard. Pulmonary/Chest: Effort normal and breath sounds normal. No respiratory distress. He has no wheezes. He has no rales. He exhibits no tenderness.  Abdominal: Soft. Bowel sounds are normal. He exhibits no distension and no mass. There is no tenderness. There is no rebound and no guarding.  Musculoskeletal: Normal range of motion. He exhibits no edema and no tenderness.  Lymphadenopathy:    He has no cervical adenopathy.  Neurological: He is alert and oriented to person, place, and time. He has normal reflexes. No cranial nerve deficit. He exhibits normal muscle tone. Coordination normal.  Skin: Skin is warm and dry. No rash noted.  Psychiatric: He has a normal mood and affect. His behavior is normal. Judgment and thought content normal.  Caries - diffuse   Lab Results  Component Value Date   WBC 9.4 08/04/2011   HGB 15.6 08/04/2011   HCT 46.0 08/04/2011   PLT 239.0 08/04/2011   GLUCOSE 232* 11/10/2011   CHOL 176 06/29/2010   TRIG 229.0* 06/29/2010   HDL 42.90 06/29/2010   LDLDIRECT 71.5 06/29/2010   LDLCALC 110* 02/06/2008   ALT 27 08/04/2011   AST 18 08/04/2011   NA 139 11/10/2011   K 4.1 11/10/2011   CL 101 11/10/2011   CREATININE 1.3 11/10/2011   BUN 14 11/10/2011   CO2 27 11/10/2011   TSH 0.97 05/05/2011   PSA 1.71 10/28/2010   HGBA1C 9.0* 11/10/2011        Assessment & Plan:

## 2012-02-16 NOTE — Assessment & Plan Note (Signed)
2012 Extensive. He hasn't come up with money yet. He missed his dental appt (free) in summer of 2013 He has to see a Education officer, community

## 2012-02-16 NOTE — Assessment & Plan Note (Signed)
Better  

## 2012-02-16 NOTE — Assessment & Plan Note (Signed)
Doxy x 10 d 

## 2012-02-16 NOTE — Assessment & Plan Note (Signed)
Continue with current prescription therapy as reflected on the Med list. Cont w/wt loss 

## 2012-02-18 ENCOUNTER — Other Ambulatory Visit: Payer: Self-pay | Admitting: Internal Medicine

## 2012-02-18 NOTE — Telephone Encounter (Signed)
Misty Stanley, please, inform patient that his diabetic control is better Thx

## 2012-02-21 ENCOUNTER — Other Ambulatory Visit: Payer: Self-pay | Admitting: Internal Medicine

## 2012-05-09 ENCOUNTER — Telehealth: Payer: Self-pay | Admitting: *Deleted

## 2012-05-09 NOTE — Telephone Encounter (Signed)
OK to fill this prescription with additional refills x0 Thank you!  

## 2012-05-09 NOTE — Telephone Encounter (Signed)
Rf req for APAP/codeine 300-15 mg 1 po bid prn. # 60. Last filled 03/01/12. Ok to rf?

## 2012-05-10 MED ORDER — ACETAMINOPHEN-CODEINE #2 300-15 MG PO TABS: 1.0000 | ORAL_TABLET | Freq: Two times a day (BID) | ORAL | Status: AC | PRN

## 2012-05-10 NOTE — Telephone Encounter (Signed)
Done

## 2012-05-17 ENCOUNTER — Ambulatory Visit: Payer: Medicare Other | Admitting: Internal Medicine

## 2012-06-06 ENCOUNTER — Ambulatory Visit: Payer: Medicare Other | Admitting: Internal Medicine

## 2012-07-01 ENCOUNTER — Ambulatory Visit (INDEPENDENT_AMBULATORY_CARE_PROVIDER_SITE_OTHER): Payer: Medicare Other | Admitting: Internal Medicine

## 2012-07-01 ENCOUNTER — Encounter: Payer: Self-pay | Admitting: Internal Medicine

## 2012-07-01 VITALS — BP 130/68 | HR 72 | Temp 98.6°F | Resp 16 | Wt 214.0 lb

## 2012-07-01 DIAGNOSIS — K219 Gastro-esophageal reflux disease without esophagitis: Secondary | ICD-10-CM

## 2012-07-01 DIAGNOSIS — E538 Deficiency of other specified B group vitamins: Secondary | ICD-10-CM

## 2012-07-01 DIAGNOSIS — E119 Type 2 diabetes mellitus without complications: Secondary | ICD-10-CM

## 2012-07-01 DIAGNOSIS — F411 Generalized anxiety disorder: Secondary | ICD-10-CM

## 2012-07-01 DIAGNOSIS — I10 Essential (primary) hypertension: Secondary | ICD-10-CM

## 2012-07-01 DIAGNOSIS — M545 Low back pain: Secondary | ICD-10-CM

## 2012-07-01 LAB — HEPATIC FUNCTION PANEL
ALT: 22 U/L (ref 0–53)
AST: 18 U/L (ref 0–37)
Albumin: 3.7 g/dL (ref 3.5–5.2)
Alkaline Phosphatase: 57 U/L (ref 39–117)
Bilirubin, Direct: 0.1 mg/dL (ref 0.0–0.3)
Total Protein: 7.2 g/dL (ref 6.0–8.3)

## 2012-07-01 LAB — BASIC METABOLIC PANEL
CO2: 27 mEq/L (ref 19–32)
Chloride: 104 mEq/L (ref 96–112)
Creatinine, Ser: 1.2 mg/dL (ref 0.4–1.5)
Potassium: 3.6 mEq/L (ref 3.5–5.1)

## 2012-07-01 NOTE — Assessment & Plan Note (Signed)
Continue with current prescription therapy as reflected on the Med list.  

## 2012-07-01 NOTE — Assessment & Plan Note (Signed)
Chronic   Potential benefits of a long term wellbutrin use as well as potential risks  and complications were explained to the patient and were aknowledged.  

## 2012-07-01 NOTE — Progress Notes (Signed)
Patient ID: Timothy Schmidt, male   DOB: [birth year], 56 y.o.   MRN: [id number]  Subjective:    Patient ID: Timothy Schmidt, male    DOB: Dec 26, 1955, 56 y.o.   MRN: [id number]  HPI  The patient presents for a follow-up of  chronic hypertension, chronic dyslipidemia, type 2 diabetes, ED controlled with medicines.  C/o sinus cong and white d/c x 2 wks F/u LBP/OA - worse     Review of Systems  Constitutional: Negative for appetite change, fatigue and unexpected weight change.  HENT: Negative for nosebleeds, congestion, sore throat, sneezing, trouble swallowing and neck pain.   Eyes: Negative for itching and visual disturbance.  Respiratory: Negative for cough.   Cardiovascular: Negative for chest pain, palpitations and leg swelling.  Gastrointestinal: Negative for nausea, diarrhea, blood in stool and abdominal distention.  Genitourinary: Negative for frequency and hematuria.  Musculoskeletal: Positive for back pain and arthralgias. Negative for joint swelling and gait problem.  Skin: Negative for rash.  Neurological: Negative for dizziness, tremors, speech difficulty and weakness.  Psychiatric/Behavioral: Negative for suicidal ideas, sleep disturbance, dysphoric mood and agitation. The patient is not nervous/anxious.    Wt Readings from Last 3 Encounters:  07/01/12 214 lb (97.07 kg)  02/16/12 207 lb (93.895 kg)  11/10/11 212 lb 8 oz (96.389 kg)   BP Readings from Last 3 Encounters:  07/01/12 130/68  02/16/12 140/82  11/10/11 110/70        Objective:   Physical Exam  Constitutional: He is oriented to person, place, and time. He appears well-developed.  HENT:  Mouth/Throat: Oropharynx is clear and moist.       Swollen nasal mucosa  Eyes: Conjunctivae normal are normal. Pupils are equal, round, and reactive to light.  Neck: Normal range of motion. No JVD present. No thyromegaly present.  Cardiovascular: Normal rate, regular rhythm, normal heart sounds and intact distal pulses.   Exam reveals no gallop and no friction rub.   No murmur heard. Pulmonary/Chest: Effort normal and breath sounds normal. No respiratory distress. He has no wheezes. He has no rales. He exhibits no tenderness.  Abdominal: Soft. Bowel sounds are normal. He exhibits no distension and no mass. There is no tenderness. There is no rebound and no guarding.  Musculoskeletal: Normal range of motion. He exhibits no edema and no tenderness.  Lymphadenopathy:    He has no cervical adenopathy.  Neurological: He is alert and oriented to person, place, and time. He has normal reflexes. No cranial nerve deficit. He exhibits normal muscle tone. Coordination normal.  Skin: Skin is warm and dry. No rash noted.  Psychiatric: He has a normal mood and affect. His behavior is normal. Judgment and thought content normal.  Caries - diffuse   Lab Results  Component Value Date   WBC 9.4 08/04/2011   HGB 15.6 08/04/2011   HCT 46.0 08/04/2011   PLT 239.0 08/04/2011   GLUCOSE 210* 07/01/2012   CHOL 176 06/29/2010   TRIG 229.0* 06/29/2010   HDL 42.90 06/29/2010   LDLDIRECT 71.5 06/29/2010   LDLCALC 110* 02/06/2008   ALT 22 07/01/2012   AST 18 07/01/2012   NA 139 07/01/2012   K 3.6 07/01/2012   CL 104 07/01/2012   CREATININE 1.2 07/01/2012   BUN 15 07/01/2012   CO2 27 07/01/2012   TSH 0.97 05/05/2011   PSA 1.71 10/28/2010   HGBA1C 8.9* 07/01/2012        Assessment & Plan:

## 2012-07-02 ENCOUNTER — Telehealth: Payer: Self-pay | Admitting: Internal Medicine

## 2012-07-02 MED ORDER — GLYBURIDE 5 MG PO TABS: 5.0000 mg | ORAL_TABLET | Freq: Every day | ORAL | Status: DC

## 2012-07-02 MED ORDER — METFORMIN HCL 1000 MG PO TABS: 1000.0000 mg | ORAL_TABLET | Freq: Two times a day (BID) | ORAL | Status: DC

## 2012-07-02 NOTE — Telephone Encounter (Signed)
Left mess for patient to call back.  

## 2012-07-02 NOTE — Telephone Encounter (Signed)
Timothy Schmidt, please, inform patient that all labs are normal except for poor DM cotrol D/c glucovamce Start Glyburide and metformin 1000 mg bid separately THX

## 2012-07-02 NOTE — Telephone Encounter (Signed)
Pt informed

## 2012-07-03 ENCOUNTER — Telehealth: Payer: Self-pay | Admitting: *Deleted

## 2012-07-03 NOTE — Telephone Encounter (Signed)
Pt informed

## 2012-07-03 NOTE — Telephone Encounter (Signed)
Pt called stating his insurance will not pay for glyburide and metformin since he just filled the glucovance. Should he still be taking the glucovance?

## 2012-07-03 NOTE — Telephone Encounter (Signed)
Finish glyburide and metformin, then stat glucovance Thx

## 2012-07-25 ENCOUNTER — Telehealth: Payer: Self-pay | Admitting: Internal Medicine

## 2012-07-25 NOTE — Telephone Encounter (Signed)
Pt's dm medicine is not covered by insurance.  [facility] filled it for Nov. They are out of business.  He uses [facility] on [address] in [place] [id number].  His medicine was changed.  Timothy Schmidt does not know the name of the medicine.  He only has 3 pills of the old medicine.  He isn't sure what the new medicine is.

## 2012-07-29 ENCOUNTER — Telehealth: Payer: Self-pay | Admitting: *Deleted

## 2012-07-29 ENCOUNTER — Telehealth: Payer: Self-pay | Admitting: Internal Medicine

## 2012-07-29 ENCOUNTER — Other Ambulatory Visit: Payer: Self-pay | Admitting: *Deleted

## 2012-07-29 MED ORDER — POTASSIUM CHLORIDE CRYS ER 20 MEQ PO TBCR: 20.0000 meq | EXTENDED_RELEASE_TABLET | Freq: Every day | ORAL | Status: DC

## 2012-07-29 MED ORDER — GLYBURIDE 5 MG PO TABS: 5.0000 mg | ORAL_TABLET | Freq: Every day | ORAL | Status: DC

## 2012-07-29 MED ORDER — METFORMIN HCL 1000 MG PO TABS: 1000.0000 mg | ORAL_TABLET | Freq: Two times a day (BID) | ORAL | Status: DC

## 2012-07-29 MED ORDER — PENICILLIN V POTASSIUM 500 MG PO TABS: 500.0000 mg | ORAL_TABLET | Freq: Four times a day (QID) | ORAL | Status: DC

## 2012-07-29 NOTE — Telephone Encounter (Signed)
Rf req for Pen VK 500 mg 1 po qid. Ok to Rf?

## 2012-07-29 NOTE — Telephone Encounter (Signed)
Noted. Thx.

## 2012-07-29 NOTE — Telephone Encounter (Signed)
OK to fill this prescription with additional refills x0 Thank you!  

## 2012-07-29 NOTE — Telephone Encounter (Signed)
Done

## 2012-07-29 NOTE — Telephone Encounter (Signed)
Patient Information:  Caller Name: Keavon  Phone: [phone]  Patient: Timothy Schmidt, Timothy Schmidt  Gender: Male  DOB: 1956-03-04  Age: 56 Years  PCP: Plotnikov, Alex (Adults only)   Symptoms  Reason For Call & Symptoms: Called regarding [facility] pharmacy mixed up medications; states pharmacy cannot determine what medications are current and lost medication list from Dr Posey Rea. Wants prescriptions sent to [facility]. Is diabetic, ran out of medication after morning dose.  Reviewed Health History In EMR: Yes  Reviewed Medications In EMR: Yes  Reviewed Allergies In EMR: Yes  Reviewed Surgeries / Procedures: Yes  Date of Onset of Symptoms: 07/19/2012  Guideline(s) Used:  No Protocol Available - Information Only  Disposition Per Guideline:   Discuss with PCP and Callback by Nurse Today  Reason For Disposition Reached:   Nursing judgment  Advice Given:  N/A  Office Follow Up:  Does the office need to follow up with this patient?: Yes  Instructions For The Office: multiple medication questions regarding changes made and current doses.  Also needs another copy of his medication list.  RN Note:  Questioning about diabetes medication ordered at office visit 07/01/12; Concerned there was a medication change sometime after that visit.  Also needs new medication list and refill for diabetic medications today.  Reviewed medications renewed 07/29/12 per Epic but caller still has questions. FBS not tested; when asked why; reported "I just don't do it." Please call back regarding multiple medication questions.  Now using [facility].

## 2012-07-30 ENCOUNTER — Telehealth: Payer: Self-pay | Admitting: *Deleted

## 2012-07-30 NOTE — Telephone Encounter (Signed)
See phone note dated 07/30/12.

## 2012-07-30 NOTE — Telephone Encounter (Signed)
I called pharmacy per pt request to inform them what diabetic meds he should be taking. I left a detailed mess informing them to fill glyburide 5 mg and metformin 1000 mg. Pt has been informed of this change but seems to be confused as to what he should be taking. I also informed pharmacy that all other diabetic meds have been d/c'd.

## 2012-08-02 ENCOUNTER — Ambulatory Visit: Payer: Medicare Other | Admitting: Internal Medicine

## 2012-08-19 ENCOUNTER — Other Ambulatory Visit: Payer: Self-pay | Admitting: *Deleted

## 2012-08-19 NOTE — Telephone Encounter (Signed)
Rec fax stating effective 08/21/12 Glyb/Metformin 2.5-500 will no longer be covered.   Covered alt is Glipizide/metformin. Please advise.

## 2012-08-20 MED ORDER — GLIPIZIDE 5 MG PO TABS: 5.0000 mg | ORAL_TABLET | Freq: Two times a day (BID) | ORAL | Status: DC

## 2012-08-20 NOTE — Telephone Encounter (Signed)
Ok to switch to glipizide Cont Metformin Thx

## 2012-09-02 ENCOUNTER — Telehealth: Payer: Self-pay | Admitting: Internal Medicine

## 2012-09-02 ENCOUNTER — Other Ambulatory Visit: Payer: Self-pay | Admitting: *Deleted

## 2012-09-02 MED ORDER — TRIAMTERENE-HCTZ 37.5-25 MG PO TABS: 1.0000 | ORAL_TABLET | Freq: Every day | ORAL | Status: DC

## 2012-09-02 MED ORDER — POTASSIUM CHLORIDE CRYS ER 20 MEQ PO TBCR: 20.0000 meq | EXTENDED_RELEASE_TABLET | Freq: Every day | ORAL | Status: DC

## 2012-09-02 NOTE — Telephone Encounter (Signed)
[facility] called requesting a refill on the patients triamterene-hydrochlorothiazide

## 2012-09-02 NOTE — Telephone Encounter (Signed)
Done

## 2012-09-06 ENCOUNTER — Ambulatory Visit: Payer: Medicare Other | Admitting: Internal Medicine

## 2012-09-11 ENCOUNTER — Ambulatory Visit: Payer: Medicare Other | Admitting: Internal Medicine

## 2012-09-11 DIAGNOSIS — Z0289 Encounter for other administrative examinations: Secondary | ICD-10-CM

## 2012-09-25 ENCOUNTER — Ambulatory Visit (INDEPENDENT_AMBULATORY_CARE_PROVIDER_SITE_OTHER): Payer: Medicare Other | Admitting: Internal Medicine

## 2012-09-25 ENCOUNTER — Encounter: Payer: Self-pay | Admitting: Internal Medicine

## 2012-09-25 ENCOUNTER — Other Ambulatory Visit (INDEPENDENT_AMBULATORY_CARE_PROVIDER_SITE_OTHER): Payer: Medicare Other

## 2012-09-25 ENCOUNTER — Telehealth: Payer: Self-pay | Admitting: Internal Medicine

## 2012-09-25 VITALS — BP 140/66 | HR 76 | Temp 99.4°F | Resp 16 | Wt 214.0 lb

## 2012-09-25 DIAGNOSIS — E669 Obesity, unspecified: Secondary | ICD-10-CM

## 2012-09-25 DIAGNOSIS — M545 Low back pain, unspecified: Secondary | ICD-10-CM

## 2012-09-25 DIAGNOSIS — F3289 Other specified depressive episodes: Secondary | ICD-10-CM

## 2012-09-25 DIAGNOSIS — E538 Deficiency of other specified B group vitamins: Secondary | ICD-10-CM

## 2012-09-25 DIAGNOSIS — I10 Essential (primary) hypertension: Secondary | ICD-10-CM

## 2012-09-25 DIAGNOSIS — E119 Type 2 diabetes mellitus without complications: Secondary | ICD-10-CM

## 2012-09-25 DIAGNOSIS — R739 Hyperglycemia, unspecified: Secondary | ICD-10-CM

## 2012-09-25 DIAGNOSIS — F329 Major depressive disorder, single episode, unspecified: Secondary | ICD-10-CM

## 2012-09-25 DIAGNOSIS — R7309 Other abnormal glucose: Secondary | ICD-10-CM

## 2012-09-25 DIAGNOSIS — K029 Dental caries, unspecified: Secondary | ICD-10-CM

## 2012-09-25 DIAGNOSIS — K219 Gastro-esophageal reflux disease without esophagitis: Secondary | ICD-10-CM

## 2012-09-25 LAB — BASIC METABOLIC PANEL
BUN: 13 mg/dL (ref 6–23)
CO2: 29 mEq/L (ref 19–32)
Calcium: 9.7 mg/dL (ref 8.4–10.5)
Creatinine, Ser: 1.1 mg/dL (ref 0.4–1.5)
Glucose, Bld: 118 mg/dL — ABNORMAL HIGH (ref 70–99)

## 2012-09-25 LAB — HEMOGLOBIN A1C: Hgb A1c MFr Bld: 8.5 % — ABNORMAL HIGH (ref 4.6–6.5)

## 2012-09-25 NOTE — Assessment & Plan Note (Signed)
Continue with current prescription therapy as reflected on the Med list.  

## 2012-09-25 NOTE — Assessment & Plan Note (Signed)
He is trying to arrange for free dental work
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
## 2012-09-25 NOTE — Assessment & Plan Note (Signed)
Wt Readings from Last 3 Encounters:  09/25/12 214 lb (97.07 kg)  07/01/12 214 lb (97.07 kg)  02/16/12 207 lb (93.895 kg)

## 2012-09-25 NOTE — Telephone Encounter (Signed)
Timothy Schmidt, please, inform patient that all labs are better Thx

## 2012-09-25 NOTE — Progress Notes (Signed)
  Subjective:     HPI  The patient presents for a follow-up of  chronic hypertension, chronic dyslipidemia, type 2 diabetes, ED controlled with medicines.   F/u LBP/OA      Review of Systems  Constitutional: Negative for appetite change, fatigue and unexpected weight change.  HENT: Negative for nosebleeds, congestion, sore throat, sneezing, trouble swallowing and neck pain.   Eyes: Negative for itching and visual disturbance.  Respiratory: Negative for cough.   Cardiovascular: Negative for chest pain, palpitations and leg swelling.  Gastrointestinal: Negative for nausea, diarrhea, blood in stool and abdominal distention.  Genitourinary: Negative for frequency and hematuria.  Musculoskeletal: Positive for back pain and arthralgias. Negative for joint swelling and gait problem.  Skin: Negative for rash.  Neurological: Negative for dizziness, tremors, speech difficulty and weakness.  Psychiatric/Behavioral: Negative for suicidal ideas, sleep disturbance, dysphoric mood and agitation. The patient is not nervous/anxious.    Wt Readings from Last 3 Encounters:  09/25/12 214 lb (97.07 kg)  07/01/12 214 lb (97.07 kg)  02/16/12 207 lb (93.895 kg)   BP Readings from Last 3 Encounters:  09/25/12 140/66  07/01/12 130/68  02/16/12 140/82        Objective:   Physical Exam  Constitutional: He is oriented to person, place, and time. He appears well-developed.  HENT:  Mouth/Throat: Oropharynx is clear and moist.       Swollen nasal mucosa  Eyes: Conjunctivae normal are normal. Pupils are equal, round, and reactive to light.  Neck: Normal range of motion. No JVD present. No thyromegaly present.  Cardiovascular: Normal rate, regular rhythm, normal heart sounds and intact distal pulses.  Exam reveals no gallop and no friction rub.   No murmur heard. Pulmonary/Chest: Effort normal and breath sounds normal. No respiratory distress. He has no wheezes. He has no rales. He exhibits no  tenderness.  Abdominal: Soft. Bowel sounds are normal. He exhibits no distension and no mass. There is no tenderness. There is no rebound and no guarding.  Musculoskeletal: Normal range of motion. He exhibits no edema and no tenderness.  Lymphadenopathy:    He has no cervical adenopathy.  Neurological: He is alert and oriented to person, place, and time. He has normal reflexes. No cranial nerve deficit. He exhibits normal muscle tone. Coordination normal.  Skin: Skin is warm and dry. No rash noted.  Psychiatric: He has a normal mood and affect. His behavior is normal. Judgment and thought content normal.  Caries - diffuse   Lab Results  Component Value Date   WBC 9.4 08/04/2011   HGB 15.6 08/04/2011   HCT 46.0 08/04/2011   PLT 239.0 08/04/2011   GLUCOSE 210* 07/01/2012   CHOL 176 06/29/2010   TRIG 229.0* 06/29/2010   HDL 42.90 06/29/2010   LDLDIRECT 71.5 06/29/2010   LDLCALC 110* 02/06/2008   ALT 22 07/01/2012   AST 18 07/01/2012   NA 139 07/01/2012   K 3.6 07/01/2012   CL 104 07/01/2012   CREATININE 1.2 07/01/2012   BUN 15 07/01/2012   CO2 27 07/01/2012   TSH 0.97 05/05/2011   PSA 1.71 10/28/2010   HGBA1C 8.9* 07/01/2012        Assessment & Plan:

## 2012-09-27 NOTE — Telephone Encounter (Signed)
Pt informed

## 2012-12-13 ENCOUNTER — Ambulatory Visit: Payer: Medicare Other | Admitting: Internal Medicine

## 2012-12-23 ENCOUNTER — Ambulatory Visit: Payer: Medicare Other | Admitting: Internal Medicine

## 2012-12-30 ENCOUNTER — Encounter: Payer: Self-pay | Admitting: Internal Medicine

## 2012-12-30 ENCOUNTER — Ambulatory Visit (INDEPENDENT_AMBULATORY_CARE_PROVIDER_SITE_OTHER): Payer: Medicare Other | Admitting: Internal Medicine

## 2012-12-30 ENCOUNTER — Other Ambulatory Visit (INDEPENDENT_AMBULATORY_CARE_PROVIDER_SITE_OTHER): Payer: Medicare Other

## 2012-12-30 VITALS — BP 120/84 | HR 98 | Temp 99.2°F | Wt 212.0 lb

## 2012-12-30 DIAGNOSIS — M545 Low back pain, unspecified: Secondary | ICD-10-CM

## 2012-12-30 DIAGNOSIS — E119 Type 2 diabetes mellitus without complications: Secondary | ICD-10-CM

## 2012-12-30 DIAGNOSIS — F79 Unspecified intellectual disabilities: Secondary | ICD-10-CM

## 2012-12-30 DIAGNOSIS — E538 Deficiency of other specified B group vitamins: Secondary | ICD-10-CM

## 2012-12-30 DIAGNOSIS — F329 Major depressive disorder, single episode, unspecified: Secondary | ICD-10-CM

## 2012-12-30 DIAGNOSIS — F3289 Other specified depressive episodes: Secondary | ICD-10-CM

## 2012-12-30 DIAGNOSIS — I10 Essential (primary) hypertension: Secondary | ICD-10-CM

## 2012-12-30 LAB — BASIC METABOLIC PANEL
BUN: 17 mg/dL (ref 6–23)
CO2: 27 mEq/L (ref 19–32)
Chloride: 102 mEq/L (ref 96–112)
Creatinine, Ser: 1.3 mg/dL (ref 0.4–1.5)

## 2012-12-30 LAB — HEMOGLOBIN A1C: Hgb A1c MFr Bld: 9.1 % — ABNORMAL HIGH (ref 4.6–6.5)

## 2012-12-30 MED ORDER — TRAMADOL HCL 50 MG PO TABS: 50.0000 mg | ORAL_TABLET | Freq: Two times a day (BID) | ORAL | Status: DC | PRN

## 2012-12-30 MED ORDER — PENICILLIN V POTASSIUM 500 MG PO TABS: 500.0000 mg | ORAL_TABLET | Freq: Four times a day (QID) | ORAL | Status: AC

## 2012-12-30 NOTE — Assessment & Plan Note (Signed)
Continue with current prescription therapy as reflected on the Med list.  

## 2012-12-30 NOTE — Assessment & Plan Note (Signed)
He states he is doing well 

## 2012-12-30 NOTE — Progress Notes (Signed)
   Subjective:     HPI  The patient presents for a follow-up of  chronic hypertension, chronic dyslipidemia, type 2 diabetes, ED controlled with medicines.   F/u LBP/OA      Review of Systems  Constitutional: Negative for appetite change, fatigue and unexpected weight change.  HENT: Negative for nosebleeds, congestion, sore throat, sneezing, trouble swallowing and neck pain.   Eyes: Negative for itching and visual disturbance.  Respiratory: Negative for cough.   Cardiovascular: Negative for chest pain, palpitations and leg swelling.  Gastrointestinal: Negative for nausea, diarrhea, blood in stool and abdominal distention.  Genitourinary: Negative for frequency and hematuria.  Musculoskeletal: Positive for back pain and arthralgias. Negative for joint swelling and gait problem.  Skin: Negative for rash.  Neurological: Negative for dizziness, tremors, speech difficulty and weakness.  Psychiatric/Behavioral: Negative for suicidal ideas, sleep disturbance, dysphoric mood and agitation. The patient is not nervous/anxious.    Wt Readings from Last 3 Encounters:  12/30/12 212 lb (96.163 kg)  09/25/12 214 lb (97.07 kg)  07/01/12 214 lb (97.07 kg)   BP Readings from Last 3 Encounters:  12/30/12 120/84  09/25/12 140/66  07/01/12 130/68        Objective:   Physical Exam  Constitutional: He is oriented to person, place, and time. He appears well-developed.  HENT:  Mouth/Throat: Oropharynx is clear and moist.  Swollen nasal mucosa  Eyes: Conjunctivae are normal. Pupils are equal, round, and reactive to light.  Neck: Normal range of motion. No JVD present. No thyromegaly present.  Cardiovascular: Normal rate, regular rhythm, normal heart sounds and intact distal pulses.  Exam reveals no gallop and no friction rub.   No murmur heard. Pulmonary/Chest: Effort normal and breath sounds normal. No respiratory distress. He has no wheezes. He has no rales. He exhibits no tenderness.   Abdominal: Soft. Bowel sounds are normal. He exhibits no distension and no mass. There is no tenderness. There is no rebound and no guarding.  Musculoskeletal: Normal range of motion. He exhibits no edema and no tenderness.  Lymphadenopathy:    He has no cervical adenopathy.  Neurological: He is alert and oriented to person, place, and time. He has normal reflexes. No cranial nerve deficit. He exhibits normal muscle tone. Coordination normal.  Skin: Skin is warm and dry. No rash noted.  Psychiatric: He has a normal mood and affect. His behavior is normal. Judgment and thought content normal.  Caries - diffuse   Lab Results  Component Value Date   WBC 9.4 08/04/2011   HGB 15.6 08/04/2011   HCT 46.0 08/04/2011   PLT 239.0 08/04/2011   GLUCOSE 118* 09/25/2012   CHOL 176 06/29/2010   TRIG 229.0* 06/29/2010   HDL 42.90 06/29/2010   LDLDIRECT 71.5 06/29/2010   LDLCALC 110* 02/06/2008   ALT 22 07/01/2012   AST 18 07/01/2012   NA 139 09/25/2012   K 3.8 09/25/2012   CL 104 09/25/2012   CREATININE 1.1 09/25/2012   BUN 13 09/25/2012   CO2 29 09/25/2012   TSH 0.97 05/05/2011   PSA 1.71 10/28/2010   HGBA1C 8.5* 09/25/2012        Assessment & Plan:

## 2012-12-30 NOTE — Assessment & Plan Note (Signed)
Continue with current prescription therapy as reflected on the Med list. Labs  

## 2013-01-30 ENCOUNTER — Other Ambulatory Visit: Payer: Self-pay | Admitting: Internal Medicine

## 2013-02-01 ENCOUNTER — Other Ambulatory Visit: Payer: Self-pay | Admitting: Internal Medicine

## 2013-02-22 ENCOUNTER — Other Ambulatory Visit: Payer: Self-pay | Admitting: Internal Medicine

## 2013-02-24 ENCOUNTER — Other Ambulatory Visit: Payer: Self-pay | Admitting: Internal Medicine

## 2013-04-01 ENCOUNTER — Encounter: Payer: Self-pay | Admitting: Internal Medicine

## 2013-04-01 ENCOUNTER — Other Ambulatory Visit (INDEPENDENT_AMBULATORY_CARE_PROVIDER_SITE_OTHER): Payer: Medicare Other

## 2013-04-01 ENCOUNTER — Ambulatory Visit (INDEPENDENT_AMBULATORY_CARE_PROVIDER_SITE_OTHER): Payer: Medicare Other | Admitting: Internal Medicine

## 2013-04-01 VITALS — BP 120/80 | HR 80 | Temp 98.0°F | Resp 16 | Wt 215.0 lb

## 2013-04-01 DIAGNOSIS — F411 Generalized anxiety disorder: Secondary | ICD-10-CM

## 2013-04-01 DIAGNOSIS — I10 Essential (primary) hypertension: Secondary | ICD-10-CM

## 2013-04-01 DIAGNOSIS — E119 Type 2 diabetes mellitus without complications: Secondary | ICD-10-CM

## 2013-04-01 DIAGNOSIS — F329 Major depressive disorder, single episode, unspecified: Secondary | ICD-10-CM

## 2013-04-01 DIAGNOSIS — M545 Low back pain: Secondary | ICD-10-CM

## 2013-04-01 DIAGNOSIS — E538 Deficiency of other specified B group vitamins: Secondary | ICD-10-CM

## 2013-04-01 LAB — BASIC METABOLIC PANEL
BUN: 13 mg/dL (ref 6–23)
CO2: 26 mEq/L (ref 19–32)
Calcium: 9.8 mg/dL (ref 8.4–10.5)
GFR: 78.75 mL/min (ref >60.00)
Glucose, Bld: 187 mg/dL — ABNORMAL HIGH (ref 70–99)
Potassium: 4 mEq/L (ref 3.5–5.1)

## 2013-04-01 NOTE — Assessment & Plan Note (Signed)
Continue with current prescription therapy as reflected on the Med list.  

## 2013-04-01 NOTE — Assessment & Plan Note (Addendum)
Continue with current prescription therapy as reflected on the Med list. Diet, meds discussed

## 2013-04-01 NOTE — Assessment & Plan Note (Signed)
Doing well 

## 2013-04-01 NOTE — Progress Notes (Signed)
   Subjective:     HPI  The patient presents for a follow-up of  chronic hypertension, chronic dyslipidemia, type 2 diabetes, ED controlled with medicines.   F/u LBP/OA      Review of Systems  Constitutional: Negative for appetite change, fatigue and unexpected weight change.  HENT: Negative for nosebleeds, congestion, sore throat, sneezing, trouble swallowing and neck pain.   Eyes: Negative for itching and visual disturbance.  Respiratory: Negative for cough.   Cardiovascular: Negative for chest pain, palpitations and leg swelling.  Gastrointestinal: Negative for nausea, diarrhea, blood in stool and abdominal distention.  Genitourinary: Negative for frequency and hematuria.  Musculoskeletal: Positive for back pain and arthralgias. Negative for joint swelling and gait problem.  Skin: Negative for rash.  Neurological: Negative for dizziness, tremors, speech difficulty and weakness.  Psychiatric/Behavioral: Negative for suicidal ideas, sleep disturbance, dysphoric mood and agitation. The patient is not nervous/anxious.    Wt Readings from Last 3 Encounters:  04/01/13 215 lb (97.523 kg)  12/30/12 212 lb (96.163 kg)  09/25/12 214 lb (97.07 kg)   BP Readings from Last 3 Encounters:  04/01/13 120/80  12/30/12 120/84  09/25/12 140/66        Objective:   Physical Exam  Constitutional: He is oriented to person, place, and time. He appears well-developed.  HENT:  Mouth/Throat: Oropharynx is clear and moist.  Swollen nasal mucosa  Eyes: Conjunctivae are normal. Pupils are equal, round, and reactive to light.  Neck: Normal range of motion. No JVD present. No thyromegaly present.  Cardiovascular: Normal rate, regular rhythm, normal heart sounds and intact distal pulses.  Exam reveals no gallop and no friction rub.   No murmur heard. Pulmonary/Chest: Effort normal and breath sounds normal. No respiratory distress. He has no wheezes. He has no rales. He exhibits no tenderness.   Abdominal: Soft. Bowel sounds are normal. He exhibits no distension and no mass. There is no tenderness. There is no rebound and no guarding.  Musculoskeletal: Normal range of motion. He exhibits no edema and no tenderness.  Lymphadenopathy:    He has no cervical adenopathy.  Neurological: He is alert and oriented to person, place, and time. He has normal reflexes. No cranial nerve deficit. He exhibits normal muscle tone. Coordination normal.  Skin: Skin is warm and dry. No rash noted.  Psychiatric: He has a normal mood and affect. His behavior is normal. Judgment and thought content normal.  Caries - diffuse   Lab Results  Component Value Date   WBC 9.4 08/04/2011   HGB 15.6 08/04/2011   HCT 46.0 08/04/2011   PLT 239.0 08/04/2011   GLUCOSE 118* 12/30/2012   CHOL 176 06/29/2010   TRIG 229.0* 06/29/2010   HDL 42.90 06/29/2010   LDLDIRECT 71.5 06/29/2010   LDLCALC 110* 02/06/2008   ALT 22 07/01/2012   AST 18 07/01/2012   NA 138 12/30/2012   K 3.4* 12/30/2012   CL 102 12/30/2012   CREATININE 1.3 12/30/2012   BUN 17 12/30/2012   CO2 27 12/30/2012   TSH 0.97 05/05/2011   PSA 1.71 10/28/2010   HGBA1C 9.1* 12/30/2012        Assessment & Plan:

## 2013-04-18 ENCOUNTER — Ambulatory Visit: Payer: Medicare Other | Admitting: Endocrinology

## 2013-04-30 ENCOUNTER — Ambulatory Visit: Payer: Medicare Other | Admitting: Endocrinology

## 2013-05-30 ENCOUNTER — Ambulatory Visit: Payer: Medicare Other | Admitting: Endocrinology

## 2013-07-01 ENCOUNTER — Ambulatory Visit (INDEPENDENT_AMBULATORY_CARE_PROVIDER_SITE_OTHER): Payer: Medicare Other | Admitting: Internal Medicine

## 2013-07-01 ENCOUNTER — Other Ambulatory Visit (INDEPENDENT_AMBULATORY_CARE_PROVIDER_SITE_OTHER): Payer: Medicare Other

## 2013-07-01 ENCOUNTER — Encounter: Payer: Self-pay | Admitting: Internal Medicine

## 2013-07-01 VITALS — BP 110/70 | HR 98 | Temp 99.8°F | Wt 218.0 lb

## 2013-07-01 DIAGNOSIS — M545 Low back pain: Secondary | ICD-10-CM

## 2013-07-01 DIAGNOSIS — E119 Type 2 diabetes mellitus without complications: Secondary | ICD-10-CM

## 2013-07-01 DIAGNOSIS — E538 Deficiency of other specified B group vitamins: Secondary | ICD-10-CM

## 2013-07-01 DIAGNOSIS — E669 Obesity, unspecified: Secondary | ICD-10-CM

## 2013-07-01 DIAGNOSIS — S0010XA Contusion of unspecified eyelid and periocular area, initial encounter: Secondary | ICD-10-CM | POA: Insufficient documentation

## 2013-07-01 DIAGNOSIS — I10 Essential (primary) hypertension: Secondary | ICD-10-CM

## 2013-07-01 DIAGNOSIS — S0012XA Contusion of left eyelid and periocular area, initial encounter: Secondary | ICD-10-CM

## 2013-07-01 DIAGNOSIS — S0003XA Contusion of scalp, initial encounter: Secondary | ICD-10-CM

## 2013-07-01 LAB — HEMOGLOBIN A1C: Hgb A1c MFr Bld: 9.7 % — ABNORMAL HIGH (ref 4.6–6.5)

## 2013-07-01 MED ORDER — PENICILLIN V POTASSIUM 500 MG PO TABS: ORAL_TABLET | ORAL | Status: DC

## 2013-07-01 NOTE — Assessment & Plan Note (Signed)
Pt was offered aspiration or incision - declined due to upcoming holidays Use warm compress

## 2013-07-01 NOTE — Progress Notes (Signed)
Pre visit review using our clinic review tool, if applicable. No additional management support is needed unless otherwise documented below in the visit note. 

## 2013-07-01 NOTE — Patient Instructions (Signed)
Use warm compress

## 2013-07-01 NOTE — Progress Notes (Signed)
   Subjective:     HPI  C/o hitting L eyebrow against a kitchen cabinet 1 month ago. He squeezed it - dark blood came out 1 wk ago  The patient presents for a follow-up of  chronic hypertension, chronic dyslipidemia, type 2 diabetes, ED controlled with medicines.   F/u LBP/OA      Review of Systems  Constitutional: Negative for appetite change, fatigue and unexpected weight change.  HENT: Negative for congestion, nosebleeds, sneezing, sore throat and trouble swallowing.   Eyes: Negative for itching and visual disturbance.  Respiratory: Negative for cough.   Cardiovascular: Negative for chest pain, palpitations and leg swelling.  Gastrointestinal: Negative for nausea, diarrhea, blood in stool and abdominal distention.  Genitourinary: Negative for frequency and hematuria.  Musculoskeletal: Positive for arthralgias and back pain. Negative for gait problem, joint swelling and neck pain.  Skin: Negative for rash.  Neurological: Negative for dizziness, tremors, speech difficulty and weakness.  Psychiatric/Behavioral: Negative for suicidal ideas, sleep disturbance, dysphoric mood and agitation. The patient is not nervous/anxious.    Wt Readings from Last 3 Encounters:  07/01/13 218 lb (98.884 kg)  04/01/13 215 lb (97.523 kg)  12/30/12 212 lb (96.163 kg)   BP Readings from Last 3 Encounters:  07/01/13 110/70  04/01/13 120/80  12/30/12 120/84        Objective:   Physical Exam  Constitutional: He is oriented to person, place, and time. He appears well-developed.  HENT:  Mouth/Throat: Oropharynx is clear and moist.  Swollen nasal mucosa  Eyes: Conjunctivae are normal. Pupils are equal, round, and reactive to light.  Neck: Normal range of motion. No JVD present. No thyromegaly present.  Cardiovascular: Normal rate, regular rhythm, normal heart sounds and intact distal pulses.  Exam reveals no gallop and no friction rub.   No murmur heard. Pulmonary/Chest: Effort normal and  breath sounds normal. No respiratory distress. He has no wheezes. He has no rales. He exhibits no tenderness.  Abdominal: Soft. Bowel sounds are normal. He exhibits no distension and no mass. There is no tenderness. There is no rebound and no guarding.  Musculoskeletal: Normal range of motion. He exhibits no edema and no tenderness.  Lymphadenopathy:    He has no cervical adenopathy.  Neurological: He is alert and oriented to person, place, and time. He has normal reflexes. No cranial nerve deficit. He exhibits normal muscle tone. Coordination normal.  Skin: Skin is warm and dry. No rash noted.  Psychiatric: He has a normal mood and affect. His behavior is normal. Judgment and thought content normal.  Caries - diffuse 1.5 cm round swelling over L lat eyebrow   Lab Results  Component Value Date   WBC 9.4 08/04/2011   HGB 15.6 08/04/2011   HCT 46.0 08/04/2011   PLT 239.0 08/04/2011   GLUCOSE 187* 04/01/2013   CHOL 176 06/29/2010   TRIG 229.0* 06/29/2010   HDL 42.90 06/29/2010   LDLDIRECT 71.5 06/29/2010   LDLCALC 110* 02/06/2008   ALT 22 07/01/2012   AST 18 07/01/2012   NA 137 04/01/2013   K 4.0 04/01/2013   CL 100 04/01/2013   CREATININE 1.2 04/01/2013   BUN 13 04/01/2013   CO2 26 04/01/2013   TSH 0.97 05/05/2011   PSA 1.71 10/28/2010   HGBA1C 9.3* 04/01/2013        Assessment & Plan:

## 2013-07-01 NOTE — Assessment & Plan Note (Signed)
Wt Readings from Last 3 Encounters:  07/01/13 218 lb (98.884 kg)  04/01/13 215 lb (97.523 kg)  12/30/12 212 lb (96.163 kg)

## 2013-07-01 NOTE — Assessment & Plan Note (Signed)
Continue with current prescription therapy as reflected on the Med list.  

## 2013-07-01 NOTE — Assessment & Plan Note (Addendum)
Risks associated with treatment and diet noncompliance were discussed. Compliance was encouraged. 

## 2013-07-02 LAB — BASIC METABOLIC PANEL
BUN: 13 mg/dL (ref 6–23)
CO2: 24 mEq/L (ref 19–32)
Calcium: 9.7 mg/dL (ref 8.4–10.5)
Chloride: 107 mEq/L (ref 96–112)
Creatinine, Ser: 1.1 mg/dL (ref 0.4–1.5)
GFR: 91.54 mL/min (ref >60.00)
Glucose, Bld: 146 mg/dL — ABNORMAL HIGH (ref 70–99)

## 2013-07-11 ENCOUNTER — Ambulatory Visit (INDEPENDENT_AMBULATORY_CARE_PROVIDER_SITE_OTHER): Payer: Medicare Other | Admitting: Nurse Practitioner

## 2013-07-11 ENCOUNTER — Encounter: Payer: Self-pay | Admitting: Nurse Practitioner

## 2013-07-11 VITALS — BP 140/94 | HR 104 | Temp 98.7°F | Ht 67.5 in | Wt 212.0 lb

## 2013-07-11 DIAGNOSIS — R6884 Jaw pain: Secondary | ICD-10-CM

## 2013-07-11 DIAGNOSIS — K0262 Dental caries on smooth surface penetrating into dentin: Secondary | ICD-10-CM

## 2013-07-11 MED ORDER — PENICILLIN V POTASSIUM 500 MG PO TABS: ORAL_TABLET | ORAL | Status: DC

## 2013-07-11 NOTE — Patient Instructions (Signed)
Please rinse mouth with peroxide & listerene. Also rinse with warm salt water -mix 1teaspoon salt with 1/4 cup warm water. Start antibiotic today. Go to dental clinic. Nice to meet you.  Dental Caries  Dental caries (also called tooth decay) is the most common oral disease. It can occur at any age, but is more common in children and young adults.  HOW DENTAL CARIES DEVELOPS  The process of decay begins when bacteria and foods (particularly sugars and starches) combine in your mouth to produce plaque. Plaque is a substance that sticks to the hard, outer surface of a tooth (enamel). The bacteria in plaque produce acids that attack enamel. These acids may also attack the root surface of a tooth (cementum) if it is exposed. Repeated attacks dissolve these surfaces and create holes in the tooth (cavities). If left untreated, the acids destroy the other layers of the tooth.  RISK FACTORS  Frequent sipping of sugary beverages.   Frequent snacking on sugary and starchy foods, especially those that easily get stuck in the teeth.   Poor oral hygiene.   Dry mouth.   Substance abuse such as methamphetamine abuse.   Broken or poor-fitting dental restorations.   Eating disorders.   Gastroesophageal reflux disease (GERD).   Certain radiation treatments to the head and neck. SYMPTOMS In the early stages of dental caries, symptoms are seldom present. Sometimes white, chalky areas may be seen on the enamel or other tooth layers. In later stages, symptoms may include:  Pits and holes on the enamel.  Toothache after sweet, hot, or cold foods or drinks are consumed.  Pain around the tooth.  Swelling around the tooth. DIAGNOSIS  Most of the time, dental caries is detected during a regular dental checkup. A diagnosis is made after a thorough medical and dental history is taken and the surfaces of your teeth are checked for signs of dental caries. Sometimes special instruments, such as lasers,  are used to check for dental caries. Dental X-ray exams may be taken so that areas not visible to the eye (such as between the contact areas of the teeth) can be checked for cavities.  TREATMENT  If dental caries is in its early stages, it may be reversed with a fluoride treatment or an application of a remineralizing agent at the dental office. Thorough brushing and flossing at home is needed to aid these treatments. If it is in its later stages, treatment depends on the location and extent of tooth destruction:   If a small area of the tooth has been destroyed, the destroyed area will be removed and cavities will be filled with a material such as gold, silver amalgam, or composite resin.   If a large area of the tooth has been destroyed, the destroyed area will be removed and a cap (crown) will be fitted over the remaining tooth structure.   If the center part of the tooth (pulp) is affected, a procedure called a root canal will be needed before a filling or crown can be placed.   If most of the tooth has been destroyed, the tooth may need to be pulled (extracted). HOME CARE INSTRUCTIONS You can prevent, stop, or reverse dental caries at home by practicing good oral hygiene. Good oral hygiene includes:  Thoroughly cleaning your teeth at least twice a day with a toothbrush and dental floss.   Using a fluoride toothpaste. A fluoride mouth rinse may also be used if recommended by your dentist or health care provider.  Restricting the amount of sugary and starchy foods and sugary liquids you consume.   Avoiding frequent snacking on these foods and sipping of these liquids.   Keeping regular visits with a dentist for checkups and cleanings. PREVENTION   Practice good oral hygiene.  Consider a dental sealant. A dental sealant is a coating material that is applied by your dentist to the pits and grooves of teeth. The sealant prevents food from being trapped in them. It may protect the  teeth for several years.  Ask about fluoride supplements if you live in a community without fluorinated water or with water that has a low fluoride content. Use fluoride supplements as directed by your dentist or health care provider.  Allow fluoride varnish applications to teeth if directed by your dentist or health care provider. Document Released: 04/29/2002 Document Revised: 04/09/2013 Document Reviewed: 08/09/2012 [facility] Patient Information 2014 [place], [place].

## 2013-07-11 NOTE — Progress Notes (Signed)
Pre-visit discussion using our clinic review tool. No additional management support is needed unless otherwise documented below in the visit note.  

## 2013-07-13 NOTE — Progress Notes (Signed)
  Subjective:    Patient ID: Timothy Schmidt, male    DOB: 1956-01-26, 57 y.o.   MRN: [id number]  Dental Pain  This is a recurrent (treated in 6 months ago for same complaint) problem. The current episode started yesterday. The problem occurs constantly. The problem has been gradually worsening. The pain is severe. Associated symptoms include facial pain and thermal sensitivity. Pertinent negatives include no difficulty swallowing, fever, oral bleeding or sinus pressure. He has tried nothing for the symptoms.      Review of Systems  Constitutional: Negative for fever, chills, activity change, appetite change and fatigue.  HENT: Positive for dental problem and facial swelling (lower lip, L side face). Negative for drooling and sinus pressure.   Gastrointestinal: Negative for vomiting.       Objective:   Physical Exam  Vitals reviewed. Constitutional: He is oriented to person, place, and time. He appears well-developed and well-nourished. No distress.  HENT:  Head: Normocephalic and atraumatic.  Mouth/Throat: Mucous membranes are normal. Dental abscesses (L lower) and dental caries (multiple carries front lower) present.    Eyes: Conjunctivae are normal. Right eye exhibits no discharge. Left eye exhibits no discharge.  Neck: Normal range of motion. Neck supple.  Cardiovascular: Normal rate.   Pulmonary/Chest: Effort normal.  Lymphadenopathy:    He has no cervical adenopathy.  Neurological: He is alert and oriented to person, place, and time.  Skin: Skin is warm and dry.  Psychiatric: He has a normal mood and affect. His behavior is normal. Thought content normal.          Assessment & Plan:  1. Dental caries Multiple. Abscess.  - Ambulatory referral to Dentistry - penicillin v potassium (VEETID) 500 MG tablet; TAKE ONE TABLET BY MOUTH FOUR TIMES DAILY  Dispense: 120 tablet; Refill: 0

## 2013-07-27 ENCOUNTER — Other Ambulatory Visit: Payer: Self-pay | Admitting: Internal Medicine

## 2013-07-28 NOTE — Telephone Encounter (Signed)
Refill done.  

## 2013-08-30 ENCOUNTER — Other Ambulatory Visit: Payer: Self-pay | Admitting: Internal Medicine

## 2013-09-01 ENCOUNTER — Other Ambulatory Visit: Payer: Self-pay | Admitting: Internal Medicine

## 2013-09-01 NOTE — Telephone Encounter (Signed)
Refill done.  

## 2013-09-09 ENCOUNTER — Encounter: Payer: Self-pay | Admitting: Internal Medicine

## 2013-09-09 ENCOUNTER — Ambulatory Visit (INDEPENDENT_AMBULATORY_CARE_PROVIDER_SITE_OTHER): Payer: Medicare HMO | Admitting: Internal Medicine

## 2013-09-09 VITALS — BP 122/90 | HR 80 | Temp 99.0°F | Resp 16 | Wt 214.0 lb

## 2013-09-09 DIAGNOSIS — M545 Low back pain, unspecified: Secondary | ICD-10-CM

## 2013-09-09 DIAGNOSIS — S0010XA Contusion of unspecified eyelid and periocular area, initial encounter: Secondary | ICD-10-CM

## 2013-09-09 DIAGNOSIS — F3289 Other specified depressive episodes: Secondary | ICD-10-CM

## 2013-09-09 DIAGNOSIS — E538 Deficiency of other specified B group vitamins: Secondary | ICD-10-CM

## 2013-09-09 DIAGNOSIS — E669 Obesity, unspecified: Secondary | ICD-10-CM

## 2013-09-09 DIAGNOSIS — J209 Acute bronchitis, unspecified: Secondary | ICD-10-CM

## 2013-09-09 DIAGNOSIS — S0083XA Contusion of other part of head, initial encounter: Secondary | ICD-10-CM

## 2013-09-09 DIAGNOSIS — F329 Major depressive disorder, single episode, unspecified: Secondary | ICD-10-CM

## 2013-09-09 DIAGNOSIS — S1093XA Contusion of unspecified part of neck, initial encounter: Secondary | ICD-10-CM

## 2013-09-09 DIAGNOSIS — K029 Dental caries, unspecified: Secondary | ICD-10-CM

## 2013-09-09 DIAGNOSIS — S0003XA Contusion of scalp, initial encounter: Secondary | ICD-10-CM

## 2013-09-09 DIAGNOSIS — E119 Type 2 diabetes mellitus without complications: Secondary | ICD-10-CM

## 2013-09-09 DIAGNOSIS — I10 Essential (primary) hypertension: Secondary | ICD-10-CM

## 2013-09-09 LAB — GLUCOSE, POCT (MANUAL RESULT ENTRY): POC Glucose: 151 mg/dl — AB (ref 70–99)

## 2013-09-09 MED ORDER — POTASSIUM CHLORIDE CRYS ER 20 MEQ PO TBCR: EXTENDED_RELEASE_TABLET | ORAL | Status: DC

## 2013-09-09 MED ORDER — METFORMIN HCL 1000 MG PO TABS: ORAL_TABLET | ORAL | Status: DC

## 2013-09-09 NOTE — Assessment & Plan Note (Addendum)
  Extensive. He hasn't come up with money yet. He missed his dental appt (free) in summer of 2013 Surgery in 1/15  Dentures by [facility]

## 2013-09-09 NOTE — Assessment & Plan Note (Signed)
Continue with current prescription therapy as reflected on the Med list.  

## 2013-09-09 NOTE — Assessment & Plan Note (Signed)
Resolved

## 2013-09-09 NOTE — Assessment & Plan Note (Signed)
Discussed wt loss 

## 2013-09-09 NOTE — Progress Notes (Signed)
   Subjective:     HPI    The patient presents for a follow-up of  chronic hypertension, chronic dyslipidemia, type 2 diabetes, ED controlled with medicines.   F/u LBP/OA      Review of Systems  Constitutional: Negative for appetite change, fatigue and unexpected weight change.  HENT: Negative for congestion, nosebleeds, sneezing, sore throat and trouble swallowing.   Eyes: Negative for itching and visual disturbance.  Respiratory: Negative for cough.   Cardiovascular: Negative for chest pain, palpitations and leg swelling.  Gastrointestinal: Negative for nausea, diarrhea, blood in stool and abdominal distention.  Genitourinary: Negative for frequency and hematuria.  Musculoskeletal: Positive for arthralgias and back pain. Negative for gait problem, joint swelling and neck pain.  Skin: Negative for rash.  Neurological: Negative for dizziness, tremors, speech difficulty and weakness.  Psychiatric/Behavioral: Negative for suicidal ideas, sleep disturbance, dysphoric mood and agitation. The patient is not nervous/anxious.    Wt Readings from Last 3 Encounters:  09/09/13 214 lb (97.07 kg)  07/11/13 212 lb (96.163 kg)  07/01/13 218 lb (98.884 kg)   BP Readings from Last 3 Encounters:  09/09/13 122/90  07/11/13 140/94  07/01/13 110/70        Objective:   Physical Exam  Constitutional: He is oriented to person, place, and time. He appears well-developed.  HENT:  Mouth/Throat: Oropharynx is clear and moist.  Swollen nasal mucosa  Eyes: Conjunctivae are normal. Pupils are equal, round, and reactive to light.  Neck: Normal range of motion. No JVD present. No thyromegaly present.  Cardiovascular: Normal rate, regular rhythm, normal heart sounds and intact distal pulses.  Exam reveals no gallop and no friction rub.   No murmur heard. Pulmonary/Chest: Effort normal and breath sounds normal. No respiratory distress. He has no wheezes. He has no rales. He exhibits no tenderness.   Abdominal: Soft. Bowel sounds are normal. He exhibits no distension and no mass. There is no tenderness. There is no rebound and no guarding.  Musculoskeletal: Normal range of motion. He exhibits no edema and no tenderness.  Lymphadenopathy:    He has no cervical adenopathy.  Neurological: He is alert and oriented to person, place, and time. He has normal reflexes. No cranial nerve deficit. He exhibits normal muscle tone. Coordination normal.  Skin: Skin is warm and dry. No rash noted.  Psychiatric: He has a normal mood and affect. His behavior is normal. Judgment and thought content normal.  Caries - diffuse no swelling over L lat eyebrow   Lab Results  Component Value Date   WBC 9.4 08/04/2011   HGB 15.6 08/04/2011   HCT 46.0 08/04/2011   PLT 239.0 08/04/2011   GLUCOSE 146* 07/01/2013   CHOL 176 06/29/2010   TRIG 229.0* 06/29/2010   HDL 42.90 06/29/2010   LDLDIRECT 71.5 06/29/2010   LDLCALC 110* 02/06/2008   ALT 22 07/01/2012   AST 18 07/01/2012   NA 139 07/01/2013   K 4.1 07/01/2013   CL 107 07/01/2013   CREATININE 1.1 07/01/2013   BUN 13 07/01/2013   CO2 24 07/01/2013   TSH 0.97 05/05/2011   PSA 1.71 10/28/2010   HGBA1C 9.7* 07/01/2013        Assessment & Plan:

## 2013-09-09 NOTE — Assessment & Plan Note (Signed)
Continue with current prn prescription therapy as reflected on the Med list.  

## 2013-09-09 NOTE — Progress Notes (Signed)
Pre visit review using our clinic review tool, if applicable. No additional management support is needed unless otherwise documented below in the visit note. 

## 2013-09-12 ENCOUNTER — Telehealth: Payer: Self-pay

## 2013-09-12 NOTE — Telephone Encounter (Signed)
Relevant patient education mailed to patient.  

## 2013-09-19 ENCOUNTER — Telehealth: Payer: Self-pay | Admitting: Internal Medicine

## 2013-09-19 NOTE — Telephone Encounter (Signed)
Relevant patient education mailed to patient.  

## 2013-09-24 ENCOUNTER — Telehealth: Payer: Self-pay

## 2013-09-24 NOTE — Telephone Encounter (Signed)
The patient is hoping to get all of [facility] on [address]  Fax [phone]

## 2013-09-25 MED ORDER — METFORMIN HCL 1000 MG PO TABS: ORAL_TABLET | ORAL | Status: DC

## 2013-09-25 MED ORDER — POTASSIUM CHLORIDE CRYS ER 20 MEQ PO TBCR: EXTENDED_RELEASE_TABLET | ORAL | Status: DC

## 2013-09-25 MED ORDER — TRIAMTERENE-HCTZ 37.5-25 MG PO TABS: 1.0000 | ORAL_TABLET | Freq: Every day | ORAL | Status: DC

## 2013-09-25 MED ORDER — GLIPIZIDE 5 MG PO TABS
5.0000 mg | ORAL_TABLET | Freq: Two times a day (BID) | ORAL | Status: DC
Start: 2013-09-25 — End: 2014-09-29

## 2013-09-25 MED ORDER — CYANOCOBALAMIN 1000 MCG PO TABS: 500.0000 ug | ORAL_TABLET | Freq: Every day | ORAL | Status: DC

## 2013-09-25 MED ORDER — BUPROPION HCL ER (SR) 150 MG PO TB12: 150.0000 mg | ORAL_TABLET | Freq: Every day | ORAL | Status: DC

## 2013-09-25 NOTE — Telephone Encounter (Signed)
Done

## 2013-09-26 ENCOUNTER — Telehealth: Payer: Self-pay

## 2013-09-26 NOTE — Telephone Encounter (Signed)
The patient called and is hoping to get his medications refilled (all of his meds) sent to [facility] [place] [facility] on [address]   Callback - [phone]

## 2013-09-26 NOTE — Telephone Encounter (Signed)
RX's previously filled 09/25/13

## 2013-10-01 ENCOUNTER — Other Ambulatory Visit (INDEPENDENT_AMBULATORY_CARE_PROVIDER_SITE_OTHER): Payer: Medicare HMO

## 2013-10-01 ENCOUNTER — Encounter: Payer: Self-pay | Admitting: Internal Medicine

## 2013-10-01 ENCOUNTER — Ambulatory Visit (INDEPENDENT_AMBULATORY_CARE_PROVIDER_SITE_OTHER): Payer: Medicare HMO | Admitting: Internal Medicine

## 2013-10-01 VITALS — BP 102/80 | HR 80 | Temp 99.1°F | Resp 16 | Wt 214.0 lb

## 2013-10-01 DIAGNOSIS — E538 Deficiency of other specified B group vitamins: Secondary | ICD-10-CM

## 2013-10-01 DIAGNOSIS — S0003XA Contusion of scalp, initial encounter: Secondary | ICD-10-CM

## 2013-10-01 DIAGNOSIS — E119 Type 2 diabetes mellitus without complications: Secondary | ICD-10-CM

## 2013-10-01 DIAGNOSIS — K4090 Unilateral inguinal hernia, without obstruction or gangrene, not specified as recurrent: Secondary | ICD-10-CM

## 2013-10-01 DIAGNOSIS — S0083XA Contusion of other part of head, initial encounter: Secondary | ICD-10-CM

## 2013-10-01 DIAGNOSIS — S0010XA Contusion of unspecified eyelid and periocular area, initial encounter: Secondary | ICD-10-CM

## 2013-10-01 DIAGNOSIS — S1093XA Contusion of unspecified part of neck, initial encounter: Secondary | ICD-10-CM

## 2013-10-01 DIAGNOSIS — I10 Essential (primary) hypertension: Secondary | ICD-10-CM

## 2013-10-01 DIAGNOSIS — K029 Dental caries, unspecified: Secondary | ICD-10-CM

## 2013-10-01 DIAGNOSIS — E669 Obesity, unspecified: Secondary | ICD-10-CM

## 2013-10-01 LAB — BASIC METABOLIC PANEL
BUN: 12 mg/dL (ref 6–23)
CALCIUM: 9.6 mg/dL (ref 8.4–10.5)
CO2: 27 mEq/L (ref 19–32)
CREATININE: 1 mg/dL (ref 0.4–1.5)
Chloride: 105 mEq/L (ref 96–112)
GFR: 97.76 mL/min (ref >60.00)
Glucose, Bld: 148 mg/dL — ABNORMAL HIGH (ref 70–99)
Potassium: 3.9 mEq/L (ref 3.5–5.1)
Sodium: 141 mEq/L (ref 135–145)

## 2013-10-01 LAB — HEMOGLOBIN A1C: HEMOGLOBIN A1C: 9.9 % — AB (ref 4.6–6.5)

## 2013-10-01 NOTE — Progress Notes (Signed)
   Subjective:     HPI  F/u caries - teeth extaction is pending   The patient presents for a follow-up of  chronic hypertension, chronic dyslipidemia, type 2 diabetes, ED  F/u LBP/OA      Review of Systems  Constitutional: Negative for appetite change, fatigue and unexpected weight change.  HENT: Negative for congestion, nosebleeds, sneezing, sore throat and trouble swallowing.   Eyes: Negative for itching and visual disturbance.  Respiratory: Negative for cough.   Cardiovascular: Negative for chest pain, palpitations and leg swelling.  Gastrointestinal: Negative for nausea, diarrhea, blood in stool and abdominal distention.  Genitourinary: Negative for frequency and hematuria.  Musculoskeletal: Positive for arthralgias and back pain. Negative for gait problem, joint swelling and neck pain.  Skin: Negative for rash.  Neurological: Negative for dizziness, tremors, speech difficulty and weakness.  Psychiatric/Behavioral: Negative for suicidal ideas, sleep disturbance, dysphoric mood and agitation. The patient is not nervous/anxious.    Wt Readings from Last 3 Encounters:  10/01/13 214 lb (97.07 kg)  09/09/13 214 lb (97.07 kg)  07/11/13 212 lb (96.163 kg)   BP Readings from Last 3 Encounters:  10/01/13 102/80  09/09/13 122/90  07/11/13 140/94        Objective:   Physical Exam  Constitutional: He is oriented to person, place, and time. He appears well-developed.  HENT:  Mouth/Throat: Oropharynx is clear and moist.  Swollen nasal mucosa  Eyes: Conjunctivae are normal. Pupils are equal, round, and reactive to light.  Neck: Normal range of motion. No JVD present. No thyromegaly present.  Cardiovascular: Normal rate, regular rhythm, normal heart sounds and intact distal pulses.  Exam reveals no gallop and no friction rub.   No murmur heard. Pulmonary/Chest: Effort normal and breath sounds normal. No respiratory distress. He has no wheezes. He has no rales. He exhibits no  tenderness.  Abdominal: Soft. Bowel sounds are normal. He exhibits no distension and no mass. There is no tenderness. There is no rebound and no guarding.  Musculoskeletal: Normal range of motion. He exhibits no edema and no tenderness.  Lymphadenopathy:    He has no cervical adenopathy.  Neurological: He is alert and oriented to person, place, and time. He has normal reflexes. No cranial nerve deficit. He exhibits normal muscle tone. Coordination normal.  Skin: Skin is warm and dry. No rash noted.  Psychiatric: He has a normal mood and affect. His behavior is normal. Judgment and thought content normal.  Caries - diffuse no swelling over L lat eyebrow   Lab Results  Component Value Date   WBC 9.4 08/04/2011   HGB 15.6 08/04/2011   HCT 46.0 08/04/2011   PLT 239.0 08/04/2011   GLUCOSE 146* 07/01/2013   CHOL 176 06/29/2010   TRIG 229.0* 06/29/2010   HDL 42.90 06/29/2010   LDLDIRECT 71.5 06/29/2010   LDLCALC 110* 02/06/2008   ALT 22 07/01/2012   AST 18 07/01/2012   NA 139 07/01/2013   K 4.1 07/01/2013   CL 107 07/01/2013   CREATININE 1.1 07/01/2013   BUN 13 07/01/2013   CO2 24 07/01/2013   TSH 0.97 05/05/2011   PSA 1.71 10/28/2010   HGBA1C 9.7* 07/01/2013        Assessment & Plan:

## 2013-10-01 NOTE — Assessment & Plan Note (Signed)
Wt Readings from Last 3 Encounters:  10/01/13 214 lb (97.07 kg)  09/09/13 214 lb (97.07 kg)  07/11/13 212 lb (96.163 kg)

## 2013-10-01 NOTE — Assessment & Plan Note (Signed)
F/u caries - teeth extaction is pending On PCN

## 2013-10-01 NOTE — Assessment & Plan Note (Signed)
Resolved

## 2013-10-01 NOTE — Assessment & Plan Note (Signed)
Continue with current prescription therapy as reflected on the Med list.  

## 2013-10-01 NOTE — Progress Notes (Signed)
Pre visit review using our clinic review tool, if applicable. No additional management support is needed unless otherwise documented below in the visit note. 

## 2013-10-03 ENCOUNTER — Telehealth: Payer: Self-pay | Admitting: *Deleted

## 2013-10-03 NOTE — Telephone Encounter (Signed)
Phoned for meds that were sent to [facility].  Meds filled already.

## 2013-10-07 ENCOUNTER — Encounter: Payer: Self-pay | Admitting: Internal Medicine

## 2013-10-15 ENCOUNTER — Other Ambulatory Visit: Payer: Self-pay | Admitting: *Deleted

## 2013-10-15 MED ORDER — BD SWABS SINGLE USE BUTTERFLY PADS: MEDICATED_PAD | Status: AC

## 2013-10-15 MED ORDER — PRODIGY CONTROL SOLUTION LOW VI SOLN: Status: AC

## 2013-10-15 MED ORDER — PRODIGY TWIST TOP LANCETS 28G MISC: Status: AC

## 2013-10-15 MED ORDER — PRODIGY AUTOCODE BLOOD GLUCOSE W/DEVICE KIT: PACK | Status: AC

## 2013-10-15 MED ORDER — GLUCOSE BLOOD VI STRP: ORAL_STRIP | Status: AC

## 2013-10-27 ENCOUNTER — Encounter: Payer: Self-pay | Admitting: Internal Medicine

## 2013-10-27 ENCOUNTER — Ambulatory Visit (INDEPENDENT_AMBULATORY_CARE_PROVIDER_SITE_OTHER): Payer: Medicare HMO | Admitting: Internal Medicine

## 2013-10-27 VITALS — BP 110/68 | HR 76 | Temp 98.7°F | Resp 16 | Wt 210.0 lb

## 2013-10-27 DIAGNOSIS — E119 Type 2 diabetes mellitus without complications: Secondary | ICD-10-CM

## 2013-10-27 DIAGNOSIS — F411 Generalized anxiety disorder: Secondary | ICD-10-CM

## 2013-10-27 DIAGNOSIS — J329 Chronic sinusitis, unspecified: Secondary | ICD-10-CM

## 2013-10-27 LAB — GLUCOSE, POCT (MANUAL RESULT ENTRY): POC GLUCOSE: 225 mg/dL — AB (ref 70–99)

## 2013-10-27 MED ORDER — AZITHROMYCIN 250 MG PO TABS: ORAL_TABLET | ORAL | Status: DC

## 2013-10-27 MED ORDER — HYDROCODONE-ACETAMINOPHEN 5-325 MG PO TABS: 1.0000 | ORAL_TABLET | Freq: Two times a day (BID) | ORAL | Status: DC | PRN

## 2013-10-27 NOTE — Progress Notes (Signed)
Subjective:     URI  This is a new problem. The current episode started 1 to 4 weeks ago. The problem has been gradually worsening. Associated symptoms include congestion, coughing, rhinorrhea and sinus pain. Pertinent negatives include no chest pain, diarrhea, nausea, neck pain, rash, sneezing, sore throat or wheezing. The treatment provided no relief.    F/u caries - teeth extaction is pending   The patient presents for a follow-up of  chronic hypertension, chronic dyslipidemia, type 2 diabetes, ED  F/u LBP/OA      Review of Systems  Constitutional: Negative for appetite change, fatigue and unexpected weight change.  HENT: Positive for congestion and rhinorrhea. Negative for nosebleeds, sneezing, sore throat and trouble swallowing.   Eyes: Negative for itching and visual disturbance.  Respiratory: Positive for cough. Negative for wheezing.   Cardiovascular: Negative for chest pain, palpitations and leg swelling.  Gastrointestinal: Negative for nausea, diarrhea, blood in stool and abdominal distention.  Genitourinary: Negative for frequency and hematuria.  Musculoskeletal: Positive for arthralgias and back pain. Negative for gait problem, joint swelling and neck pain.  Skin: Negative for rash.  Neurological: Negative for dizziness, tremors, speech difficulty and weakness.  Psychiatric/Behavioral: Negative for suicidal ideas, sleep disturbance, dysphoric mood and agitation. The patient is not nervous/anxious.    Wt Readings from Last 3 Encounters:  10/27/13 210 lb (95.255 kg)  10/01/13 214 lb (97.07 kg)  09/09/13 214 lb (97.07 kg)   BP Readings from Last 3 Encounters:  10/27/13 110/68  10/01/13 102/80  09/09/13 122/90        Objective:   Physical Exam  Constitutional: He is oriented to person, place, and time. He appears well-developed.  HENT:  Mouth/Throat: Oropharynx is clear and moist.  Swollen nasal mucosa  Eyes: Conjunctivae are normal. Pupils are equal,  round, and reactive to light.  Neck: Normal range of motion. No JVD present. No thyromegaly present.  Cardiovascular: Normal rate, regular rhythm, normal heart sounds and intact distal pulses.  Exam reveals no gallop and no friction rub.   No murmur heard. Pulmonary/Chest: Effort normal and breath sounds normal. No respiratory distress. He has no wheezes. He has no rales. He exhibits no tenderness.  Abdominal: Soft. Bowel sounds are normal. He exhibits no distension and no mass. There is no tenderness. There is no rebound and no guarding.  Musculoskeletal: Normal range of motion. He exhibits no edema and no tenderness.  Lymphadenopathy:    He has no cervical adenopathy.  Neurological: He is alert and oriented to person, place, and time. He has normal reflexes. No cranial nerve deficit. He exhibits normal muscle tone. Coordination normal.  Skin: Skin is warm and dry. No rash noted.  Psychiatric: He has a normal mood and affect. His behavior is normal. Judgment and thought content normal.  Caries - diffuse no swelling over L lat eyebrow   Lab Results  Component Value Date   WBC 9.4 08/04/2011   HGB 15.6 08/04/2011   HCT 46.0 08/04/2011   PLT 239.0 08/04/2011   GLUCOSE 148* 10/01/2013   CHOL 176 06/29/2010   TRIG 229.0* 06/29/2010   HDL 42.90 06/29/2010   LDLDIRECT 71.5 06/29/2010   LDLCALC 110* 02/06/2008   ALT 22 07/01/2012   AST 18 07/01/2012   NA 141 10/01/2013   K 3.9 10/01/2013   CL 105 10/01/2013   CREATININE 1.0 10/01/2013   BUN 12 10/01/2013   CO2 27 10/01/2013   TSH 0.97 05/05/2011   PSA 1.71 10/28/2010  HGBA1C 9.9* 10/01/2013        Assessment & Plan:

## 2013-10-27 NOTE — Assessment & Plan Note (Signed)
Chronic   Potential benefits of a long term wellbutrin use as well as potential risks  and complications were explained to the patient and were aknowledged.

## 2013-10-27 NOTE — Assessment & Plan Note (Signed)
Endo appt pending

## 2013-10-27 NOTE — Progress Notes (Signed)
Pre visit review using our clinic review tool, if applicable. No additional management support is needed unless otherwise documented below in the visit note. 

## 2013-10-27 NOTE — Assessment & Plan Note (Signed)
Zpac 

## 2013-10-31 ENCOUNTER — Ambulatory Visit: Payer: Medicare HMO | Admitting: Endocrinology

## 2013-11-07 ENCOUNTER — Ambulatory Visit: Payer: Medicare HMO | Admitting: Internal Medicine

## 2013-11-20 ENCOUNTER — Encounter: Payer: Self-pay | Admitting: Internal Medicine

## 2014-01-02 ENCOUNTER — Ambulatory Visit (INDEPENDENT_AMBULATORY_CARE_PROVIDER_SITE_OTHER): Payer: Medicare HMO | Admitting: Internal Medicine

## 2014-01-02 ENCOUNTER — Encounter: Payer: Self-pay | Admitting: Internal Medicine

## 2014-01-02 ENCOUNTER — Other Ambulatory Visit (INDEPENDENT_AMBULATORY_CARE_PROVIDER_SITE_OTHER): Payer: Medicare HMO

## 2014-01-02 VITALS — BP 100/76 | HR 80 | Temp 99.5°F | Resp 16 | Wt 207.0 lb

## 2014-01-02 DIAGNOSIS — M545 Low back pain, unspecified: Secondary | ICD-10-CM

## 2014-01-02 DIAGNOSIS — E119 Type 2 diabetes mellitus without complications: Secondary | ICD-10-CM

## 2014-01-02 DIAGNOSIS — E538 Deficiency of other specified B group vitamins: Secondary | ICD-10-CM

## 2014-01-02 LAB — HEPATIC FUNCTION PANEL
ALT: 22 U/L (ref 0–53)
AST: 15 U/L (ref 0–37)
Albumin: 4 g/dL (ref 3.5–5.2)
Alkaline Phosphatase: 44 U/L (ref 39–117)
Bilirubin, Direct: 0.1 mg/dL (ref 0.0–0.3)
TOTAL PROTEIN: 7.2 g/dL (ref 6.0–8.3)
Total Bilirubin: 0.7 mg/dL (ref 0.2–1.2)

## 2014-01-02 LAB — LIPID PANEL
CHOLESTEROL: 156 mg/dL (ref 0–200)
HDL: 44.4 mg/dL (ref >39.00)
LDL Cholesterol: 59 mg/dL (ref 0–99)
Total CHOL/HDL Ratio: 4
Triglycerides: 265 mg/dL — ABNORMAL HIGH (ref 0.0–149.0)
VLDL: 53 mg/dL — AB (ref 0.0–40.0)

## 2014-01-02 LAB — HEMOGLOBIN A1C: Hgb A1c MFr Bld: 8.9 % — ABNORMAL HIGH (ref 4.6–6.5)

## 2014-01-02 LAB — GLUCOSE, POCT (MANUAL RESULT ENTRY): POC Glucose: 157 mg/dl — AB (ref 70–99)

## 2014-01-02 MED ORDER — HYDROCODONE-ACETAMINOPHEN 5-325 MG PO TABS: 1.0000 | ORAL_TABLET | Freq: Two times a day (BID) | ORAL | Status: DC | PRN

## 2014-01-02 NOTE — Progress Notes (Signed)
Pre visit review using our clinic review tool, if applicable. No additional management support is needed unless otherwise documented below in the visit note. 

## 2014-01-02 NOTE — Assessment & Plan Note (Signed)
Labs

## 2014-01-02 NOTE — Assessment & Plan Note (Signed)
Continue with current prn prescription therapy as reflected on the Med list.  

## 2014-01-02 NOTE — Assessment & Plan Note (Signed)
Wt Readings from Last 3 Encounters:  01/02/14 207 lb (93.895 kg)  10/27/13 210 lb (95.255 kg)  10/01/13 214 lb (97.07 kg)   Continue with current prescription therapy as reflected on the Med list. Labs

## 2014-01-03 ENCOUNTER — Encounter: Payer: Self-pay | Admitting: Internal Medicine

## 2014-01-03 NOTE — Progress Notes (Signed)
   Subjective:     HPI  F/u caries - teeth extaction is pending   The patient presents for a follow-up of  chronic hypertension, chronic dyslipidemia, type 2 diabetes, ED  F/u LBP/OA      Review of Systems  Constitutional: Negative for appetite change, fatigue and unexpected weight change.  HENT: Negative for nosebleeds and trouble swallowing.   Eyes: Negative for itching and visual disturbance.  Cardiovascular: Negative for palpitations and leg swelling.  Gastrointestinal: Negative for blood in stool and abdominal distention.  Genitourinary: Negative for frequency and hematuria.  Musculoskeletal: Positive for arthralgias and back pain. Negative for gait problem and joint swelling.  Neurological: Negative for dizziness, tremors, speech difficulty and weakness.  Psychiatric/Behavioral: Negative for suicidal ideas, sleep disturbance, dysphoric mood and agitation. The patient is not nervous/anxious.    Wt Readings from Last 3 Encounters:  01/02/14 207 lb (93.895 kg)  10/27/13 210 lb (95.255 kg)  10/01/13 214 lb (97.07 kg)   BP Readings from Last 3 Encounters:  01/02/14 100/76  10/27/13 110/68  10/01/13 102/80        Objective:   Physical Exam  Constitutional: He is oriented to person, place, and time. He appears well-developed.  HENT:  Mouth/Throat: Oropharynx is clear and moist.  Swollen nasal mucosa  Eyes: Conjunctivae are normal. Pupils are equal, round, and reactive to light.  Neck: Normal range of motion. No JVD present. No thyromegaly present.  Cardiovascular: Normal rate, regular rhythm, normal heart sounds and intact distal pulses.  Exam reveals no gallop and no friction rub.   No murmur heard. Pulmonary/Chest: Effort normal and breath sounds normal. No respiratory distress. He has no wheezes. He has no rales. He exhibits no tenderness.  Abdominal: Soft. Bowel sounds are normal. He exhibits no distension and no mass. There is no tenderness. There is no rebound  and no guarding.  Musculoskeletal: Normal range of motion. He exhibits no edema and no tenderness.  Lymphadenopathy:    He has no cervical adenopathy.  Neurological: He is alert and oriented to person, place, and time. He has normal reflexes. No cranial nerve deficit. He exhibits normal muscle tone. Coordination normal.  Skin: Skin is warm and dry. No rash noted.  Psychiatric: He has a normal mood and affect. His behavior is normal. Judgment and thought content normal.  Caries - diffuse no swelling over L lat eyebrow   Lab Results  Component Value Date   WBC 9.4 08/04/2011   HGB 15.6 08/04/2011   HCT 46.0 08/04/2011   PLT 239.0 08/04/2011   GLUCOSE 148* 10/01/2013   CHOL 156 01/02/2014   TRIG 265.0* 01/02/2014   HDL 44.40 01/02/2014   LDLDIRECT 71.5 06/29/2010   LDLCALC 59 01/02/2014   ALT 22 01/02/2014   AST 15 01/02/2014   NA 141 10/01/2013   K 3.9 10/01/2013   CL 105 10/01/2013   CREATININE 1.0 10/01/2013   BUN 12 10/01/2013   CO2 27 10/01/2013   TSH 0.97 05/05/2011   PSA 1.71 10/28/2010   HGBA1C 8.9* 01/02/2014        Assessment & Plan:

## 2014-01-28 ENCOUNTER — Encounter: Payer: Self-pay | Admitting: Internal Medicine

## 2014-04-10 ENCOUNTER — Ambulatory Visit: Payer: Medicare HMO | Admitting: Internal Medicine

## 2014-04-23 ENCOUNTER — Telehealth: Payer: Self-pay | Admitting: Internal Medicine

## 2014-04-23 NOTE — Telephone Encounter (Signed)
Patient is requesting fish oil to be added to his med list and he is looking for the name of a med he takes that has zinc in it that is also suppose to be added to his med list.

## 2014-04-24 NOTE — Telephone Encounter (Signed)
Ok Thx 

## 2014-05-27 ENCOUNTER — Telehealth: Payer: Self-pay | Admitting: Internal Medicine

## 2014-05-27 NOTE — Telephone Encounter (Signed)
Pt is needing to know when he was diagnose with diabetes...[name]/lmb

## 2014-05-27 NOTE — Telephone Encounter (Signed)
2008 Thx

## 2014-05-27 NOTE — Telephone Encounter (Signed)
I did not get it. What does he need? Thx

## 2014-05-27 NOTE — Telephone Encounter (Signed)
Pt called in with ins agent.  He needed to know when he was doggoned with diabetes.  Please call him at home number

## 2014-05-27 NOTE — Telephone Encounter (Signed)
Notified pt with md response.../lmb 

## 2014-08-28 ENCOUNTER — Ambulatory Visit (INDEPENDENT_AMBULATORY_CARE_PROVIDER_SITE_OTHER): Payer: Commercial Managed Care - HMO | Admitting: Internal Medicine

## 2014-08-28 ENCOUNTER — Encounter: Payer: Self-pay | Admitting: Internal Medicine

## 2014-08-28 ENCOUNTER — Other Ambulatory Visit (INDEPENDENT_AMBULATORY_CARE_PROVIDER_SITE_OTHER): Payer: Commercial Managed Care - HMO

## 2014-08-28 VITALS — BP 120/82 | HR 96 | Temp 99.2°F | Wt 209.0 lb

## 2014-08-28 DIAGNOSIS — I10 Essential (primary) hypertension: Secondary | ICD-10-CM | POA: Diagnosis not present

## 2014-08-28 DIAGNOSIS — M5441 Lumbago with sciatica, right side: Secondary | ICD-10-CM

## 2014-08-28 DIAGNOSIS — E538 Deficiency of other specified B group vitamins: Secondary | ICD-10-CM

## 2014-08-28 DIAGNOSIS — K029 Dental caries, unspecified: Secondary | ICD-10-CM | POA: Diagnosis not present

## 2014-08-28 DIAGNOSIS — IMO0002 Reserved for concepts with insufficient information to code with codable children: Secondary | ICD-10-CM

## 2014-08-28 DIAGNOSIS — M5442 Lumbago with sciatica, left side: Secondary | ICD-10-CM | POA: Diagnosis not present

## 2014-08-28 DIAGNOSIS — E1165 Type 2 diabetes mellitus with hyperglycemia: Secondary | ICD-10-CM | POA: Diagnosis not present

## 2014-08-28 LAB — BASIC METABOLIC PANEL
BUN: 19 mg/dL (ref 6–23)
CO2: 27 mEq/L (ref 19–32)
Calcium: 9.6 mg/dL (ref 8.4–10.5)
Chloride: 103 mEq/L (ref 96–112)
Creatinine, Ser: 1.1 mg/dL (ref 0.4–1.5)
GFR: 93.17 mL/min (ref >60.00)
GLUCOSE: 197 mg/dL — AB (ref 70–99)
Potassium: 3.8 mEq/L (ref 3.5–5.1)
Sodium: 137 mEq/L (ref 135–145)

## 2014-08-28 LAB — HEPATIC FUNCTION PANEL
ALK PHOS: 51 U/L (ref 39–117)
ALT: 16 U/L (ref 0–53)
AST: 16 U/L (ref 0–37)
Albumin: 3.9 g/dL (ref 3.5–5.2)
BILIRUBIN DIRECT: 0.1 mg/dL (ref 0.0–0.3)
BILIRUBIN TOTAL: 0.4 mg/dL (ref 0.2–1.2)
TOTAL PROTEIN: 7.2 g/dL (ref 6.0–8.3)

## 2014-08-28 LAB — HEMOGLOBIN A1C: HEMOGLOBIN A1C: 8.8 % — AB (ref 4.6–6.5)

## 2014-08-28 MED ORDER — CYANOCOBALAMIN 1000 MCG/ML IJ SOLN
1000.0000 ug | Freq: Once | INTRAMUSCULAR | Status: AC
  Administered 2014-08-28: 1000 ug via INTRAMUSCULAR

## 2014-08-28 NOTE — Progress Notes (Signed)
Pre visit review using our clinic review tool, if applicable. No additional management support is needed unless otherwise documented below in the visit note. 

## 2014-08-28 NOTE — Assessment & Plan Note (Signed)
Continue with current prescription therapy as reflected on the Med list.  

## 2014-08-28 NOTE — Assessment & Plan Note (Signed)
Since 2012 Extensive. He hasn't come up with money yet. He missed his dental appt (free) in summer of 2013 He has to see a Education officer, communitydentist

## 2014-08-28 NOTE — Patient Instructions (Signed)
Diabetes Mellitus and Food It is important for you to manage your blood sugar (glucose) level. Your blood glucose level can be greatly affected by what you eat. Eating healthier foods in the appropriate amounts throughout the day at about the same time each day will help you control your blood glucose level. It can also help slow or prevent worsening of your diabetes mellitus. Healthy eating may even help you improve the level of your blood pressure and reach or maintain a healthy weight.  HOW CAN FOOD AFFECT ME? Carbohydrates Carbohydrates affect your blood glucose level more than any other type of food. Your dietitian will help you determine how many carbohydrates to eat at each meal and teach you how to count carbohydrates. Counting carbohydrates is important to keep your blood glucose at a healthy level, especially if you are using insulin or taking certain medicines for diabetes mellitus. Alcohol Alcohol can cause sudden decreases in blood glucose (hypoglycemia), especially if you use insulin or take certain medicines for diabetes mellitus. Hypoglycemia can be a life-threatening condition. Symptoms of hypoglycemia (sleepiness, dizziness, and disorientation) are similar to symptoms of having too much alcohol.  If your health care provider has given you approval to drink alcohol, do so in moderation and use the following guidelines:  Women should not have more than one drink per day, and men should not have more than two drinks per day. One drink is equal to:  12 oz of beer.  5 oz of wine.  1 oz of hard liquor.  Do not drink on an empty stomach.  Keep yourself hydrated. Have water, diet soda, or unsweetened iced tea.  Regular soda, juice, and other mixers might contain a lot of carbohydrates and should be counted. WHAT FOODS ARE NOT RECOMMENDED? As you make food choices, it is important to remember that all foods are not the same. Some foods have fewer nutrients per serving than other  foods, even though they might have the same number of calories or carbohydrates. It is difficult to get your body what it needs when you eat foods with fewer nutrients. Examples of foods that you should avoid that are high in calories and carbohydrates but low in nutrients include:  Trans fats (most processed foods list trans fats on the Nutrition Facts label).  Regular soda.  Juice.  Candy.  Sweets, such as cake, pie, doughnuts, and cookies.  Fried foods. WHAT FOODS CAN I EAT? Have nutrient-rich foods, which will nourish your body and keep you healthy. The food you should eat also will depend on several factors, including:  The calories you need.  The medicines you take.  Your weight.  Your blood glucose level.  Your blood pressure level.  Your cholesterol level. You also should eat a variety of foods, including:  Protein, such as meat, poultry, fish, tofu, nuts, and seeds (lean animal proteins are best).  Fruits.  Vegetables.  Dairy products, such as milk, cheese, and yogurt (low fat is best).  Breads, grains, pasta, cereal, rice, and beans.  Fats such as olive oil, trans fat-free margarine, canola oil, avocado, and olives. DOES EVERYONE WITH DIABETES MELLITUS HAVE THE SAME MEAL PLAN? Because every person with diabetes mellitus is different, there is not one meal plan that works for everyone. It is very important that you meet with a dietitian who will help you create a meal plan that is just right for you. Document Released: 05/04/2005 Document Revised: 08/12/2013 Document Reviewed: 07/04/2013 Exit[employer] Patient Information 2015 [employer]. This   information is not intended to replace advice given to you by your health care provider. Make sure you discuss any questions you have with your health care provider.  

## 2014-08-28 NOTE — Progress Notes (Signed)
   Subjective:     HPI  F/u caries - teeth extaction is pending   The patient presents for a follow-up of  chronic hypertension, chronic dyslipidemia, type 2 diabetes, ED  F/u LBP/OA      Review of Systems  Constitutional: Negative for appetite change, fatigue and unexpected weight change.  HENT: Negative for nosebleeds and trouble swallowing.   Eyes: Negative for itching and visual disturbance.  Cardiovascular: Negative for palpitations and leg swelling.  Gastrointestinal: Negative for blood in stool and abdominal distention.  Genitourinary: Negative for frequency and hematuria.  Musculoskeletal: Positive for back pain and arthralgias. Negative for joint swelling and gait problem.  Neurological: Negative for dizziness, tremors, speech difficulty and weakness.  Psychiatric/Behavioral: Negative for suicidal ideas, sleep disturbance, dysphoric mood and agitation. The patient is not nervous/anxious.    Wt Readings from Last 3 Encounters:  08/28/14 209 lb (94.802 kg)  01/02/14 207 lb (93.895 kg)  10/27/13 210 lb (95.255 kg)   BP Readings from Last 3 Encounters:  08/28/14 120/82  01/02/14 100/76  10/27/13 110/68        Objective:   Physical Exam  Constitutional: He is oriented to person, place, and time. He appears well-developed. No distress.  NAD  HENT:  Mouth/Throat: Oropharynx is clear and moist.  Eyes: Conjunctivae are normal. Pupils are equal, round, and reactive to light.  Neck: Normal range of motion. No JVD present. No thyromegaly present.  Cardiovascular: Normal rate, regular rhythm, normal heart sounds and intact distal pulses.  Exam reveals no gallop and no friction rub.   No murmur heard. Pulmonary/Chest: Effort normal and breath sounds normal. No respiratory distress. He has no wheezes. He has no rales. He exhibits no tenderness.  Abdominal: Soft. Bowel sounds are normal. He exhibits no distension and no mass. There is no tenderness. There is no rebound and  no guarding.  Musculoskeletal: Normal range of motion. He exhibits no edema or tenderness.  Lymphadenopathy:    He has no cervical adenopathy.  Neurological: He is alert and oriented to person, place, and time. He has normal reflexes. No cranial nerve deficit. He exhibits normal muscle tone. He displays a negative Romberg sign. Coordination and gait normal.  No meningeal signs  Skin: Skin is warm and dry. No rash noted.  Psychiatric: He has a normal mood and affect. His behavior is normal. Judgment and thought content normal.  Caries - diffuse no swelling over L lat eyebrow   Lab Results  Component Value Date   WBC 9.4 08/04/2011   HGB 15.6 08/04/2011   HCT 46.0 08/04/2011   PLT 239.0 08/04/2011   GLUCOSE 148* 10/01/2013   CHOL 156 01/02/2014   TRIG 265.0* 01/02/2014   HDL 44.40 01/02/2014   LDLDIRECT 71.5 06/29/2010   LDLCALC 59 01/02/2014   ALT 22 01/02/2014   AST 15 01/02/2014   NA 141 10/01/2013   K 3.9 10/01/2013   CL 105 10/01/2013   CREATININE 1.0 10/01/2013   BUN 12 10/01/2013   CO2 27 10/01/2013   TSH 0.97 05/05/2011   PSA 1.71 10/28/2010   HGBA1C 8.9* 01/02/2014        Assessment & Plan:

## 2014-08-28 NOTE — Assessment & Plan Note (Signed)
Chronic MSK/OA  Potential benefits of a long term prn opioids use as well as potential risks (i.e. addiction risk, apnea etc) and complications (i.e. Somnolence, constipation and others) were explained to the patient and were aknowledged.

## 2014-08-31 ENCOUNTER — Telehealth: Payer: Self-pay | Admitting: Internal Medicine

## 2014-08-31 NOTE — Telephone Encounter (Signed)
emmi mailed  °

## 2014-09-17 ENCOUNTER — Telehealth: Payer: Self-pay | Admitting: Internal Medicine

## 2014-09-17 NOTE — Telephone Encounter (Signed)
Pt called in said that he would like her to just send both letters sent a same time?

## 2014-09-18 NOTE — Telephone Encounter (Signed)
I do not understand what is needed. Please advise

## 2014-09-21 NOTE — Telephone Encounter (Signed)
Pt called back in and requested phone call from stacy .  Would not tell me what he was needing     On cell number

## 2014-09-22 NOTE — Telephone Encounter (Signed)
pls write  Thx

## 2014-09-22 NOTE — Telephone Encounter (Signed)
Pt is requesting a letter of medical necessity to have his teeth fixed. He states PCP mentioned the need to have the dental work done. Please advise.

## 2014-09-24 NOTE — Telephone Encounter (Signed)
Letter printed/pending MD sig. 

## 2014-09-25 NOTE — Telephone Encounter (Signed)
Letter upfront for p/u. Pt informed.  

## 2014-09-29 ENCOUNTER — Other Ambulatory Visit: Payer: Self-pay | Admitting: Internal Medicine

## 2014-10-01 ENCOUNTER — Other Ambulatory Visit: Payer: Self-pay | Admitting: Internal Medicine

## 2014-10-12 ENCOUNTER — Other Ambulatory Visit: Payer: Medicare HMO

## 2014-10-12 ENCOUNTER — Ambulatory Visit (INDEPENDENT_AMBULATORY_CARE_PROVIDER_SITE_OTHER): Payer: Commercial Managed Care - HMO | Admitting: Internal Medicine

## 2014-10-12 VITALS — BP 124/70 | HR 105 | Temp 98.5°F | Wt 209.0 lb

## 2014-10-12 DIAGNOSIS — S060X0A Concussion without loss of consciousness, initial encounter: Secondary | ICD-10-CM | POA: Diagnosis not present

## 2014-10-12 DIAGNOSIS — E1165 Type 2 diabetes mellitus with hyperglycemia: Secondary | ICD-10-CM

## 2014-10-12 DIAGNOSIS — M79642 Pain in left hand: Secondary | ICD-10-CM | POA: Diagnosis not present

## 2014-10-12 DIAGNOSIS — IMO0002 Reserved for concepts with insufficient information to code with codable children: Secondary | ICD-10-CM

## 2014-10-12 LAB — GLUCOSE, POCT (MANUAL RESULT ENTRY): POC GLUCOSE: 197 mg/dL — AB (ref 70–99)

## 2014-10-12 MED ORDER — HYDROCODONE-ACETAMINOPHEN 5-325 MG PO TABS: 1.0000 | ORAL_TABLET | Freq: Two times a day (BID) | ORAL | Status: DC | PRN

## 2014-10-12 NOTE — Progress Notes (Signed)
   Subjective:     HPI  C/o slipping and falling on ice on 09/27/14 at home - he hit his head and L wrist. No LOC. C/o HA 6/10 at times F/u caries - teeth extaction is pending   The patient presents for a follow-up of  chronic hypertension, chronic dyslipidemia, type 2 diabetes, ED  F/u LBP/OA      Review of Systems  Constitutional: Negative for appetite change, fatigue and unexpected weight change.  HENT: Negative for nosebleeds and trouble swallowing.   Eyes: Negative for itching and visual disturbance.  Cardiovascular: Negative for palpitations and leg swelling.  Gastrointestinal: Negative for blood in stool and abdominal distention.  Genitourinary: Negative for frequency and hematuria.  Musculoskeletal: Positive for back pain and arthralgias. Negative for joint swelling and gait problem.  Neurological: Negative for dizziness, tremors, speech difficulty and weakness.  Psychiatric/Behavioral: Negative for suicidal ideas, sleep disturbance, dysphoric mood and agitation. The patient is not nervous/anxious.    Wt Readings from Last 3 Encounters:  10/12/14 209 lb (94.802 kg)  08/28/14 209 lb (94.802 kg)  01/02/14 207 lb (93.895 kg)   BP Readings from Last 3 Encounters:  10/12/14 124/70  08/28/14 120/82  01/02/14 100/76        Objective:   Physical Exam  Constitutional: He is oriented to person, place, and time. He appears well-developed. No distress.  NAD  HENT:  Mouth/Throat: Oropharynx is clear and moist.  Eyes: Conjunctivae are normal. Pupils are equal, round, and reactive to light.  Neck: Normal range of motion. No JVD present. No thyromegaly present.  Cardiovascular: Normal rate, regular rhythm, normal heart sounds and intact distal pulses.  Exam reveals no gallop and no friction rub.   No murmur heard. Pulmonary/Chest: Effort normal and breath sounds normal. No respiratory distress. He has no wheezes. He has no rales. He exhibits no tenderness.  Abdominal: Soft.  Bowel sounds are normal. He exhibits no distension and no mass. There is no tenderness. There is no rebound and no guarding.  Musculoskeletal: Normal range of motion. He exhibits no edema or tenderness.  Lymphadenopathy:    He has no cervical adenopathy.  Neurological: He is alert and oriented to person, place, and time. He has normal reflexes. No cranial nerve deficit. He exhibits normal muscle tone. He displays a negative Romberg sign. Coordination and gait normal.  No meningeal signs  Skin: Skin is warm and dry. No rash noted.  Psychiatric: He has a normal mood and affect. His behavior is normal. Judgment and thought content normal.  Caries - diffuse no swelling over L lat eyebrow L hand is swollen, tender over fingers #4-5   Lab Results  Component Value Date   WBC 9.4 08/04/2011   HGB 15.6 08/04/2011   HCT 46.0 08/04/2011   PLT 239.0 08/04/2011   GLUCOSE 197* 08/28/2014   CHOL 156 01/02/2014   TRIG 265.0* 01/02/2014   HDL 44.40 01/02/2014   LDLDIRECT 71.5 06/29/2010   LDLCALC 59 01/02/2014   ALT 16 08/28/2014   AST 16 08/28/2014   NA 137 08/28/2014   K 3.8 08/28/2014   CL 103 08/28/2014   CREATININE 1.1 08/28/2014   BUN 19 08/28/2014   CO2 27 08/28/2014   TSH 0.97 05/05/2011   PSA 1.71 10/28/2010   HGBA1C 8.8* 08/28/2014        Assessment & Plan:

## 2014-10-12 NOTE — Progress Notes (Signed)
Pre visit review using our clinic review tool, if applicable. No additional management support is needed unless otherwise documented below in the visit note. 

## 2014-10-12 NOTE — Assessment & Plan Note (Addendum)
09/27/14 C/o slipping and falling on ice on 09/27/14 at home - he hit his head and L wrist. No LOC CT head

## 2014-10-12 NOTE — Assessment & Plan Note (Signed)
09/27/14 C/o slipping and falling on ice on 09/26/14 at home - he hit his head and L wrist. No LOC X ray

## 2014-10-21 ENCOUNTER — Ambulatory Visit: Payer: Medicare HMO | Admitting: Internal Medicine

## 2014-11-05 ENCOUNTER — Other Ambulatory Visit: Payer: Medicare HMO

## 2014-11-25 ENCOUNTER — Telehealth: Payer: Self-pay | Admitting: Internal Medicine

## 2014-11-25 NOTE — Telephone Encounter (Signed)
Patient is requesting you call him in regards to getting lab orders. Only wants to talk to you.

## 2014-11-26 NOTE — Telephone Encounter (Signed)
Spoke to pt is requesting labs prior to appointment. Pt has [employer] and will not pay for labs prior to an appointment.  Pt also want to know if he could have a lipid test done that day too.   Pt requested last lab results.

## 2014-11-26 NOTE — Telephone Encounter (Signed)
Pt states that

## 2014-11-27 ENCOUNTER — Ambulatory Visit: Payer: Medicare HMO | Admitting: Internal Medicine

## 2014-11-27 ENCOUNTER — Inpatient Hospital Stay: Admission: RE | Admit: 2014-11-27 | Payer: Medicare HMO | Source: Ambulatory Visit

## 2014-12-11 ENCOUNTER — Ambulatory Visit: Payer: Medicare HMO | Admitting: Internal Medicine

## 2015-01-06 ENCOUNTER — Inpatient Hospital Stay: Admission: RE | Admit: 2015-01-06 | Payer: Commercial Managed Care - HMO | Source: Ambulatory Visit

## 2015-01-08 ENCOUNTER — Ambulatory Visit: Payer: Commercial Managed Care - HMO | Admitting: Internal Medicine

## 2015-02-05 ENCOUNTER — Ambulatory Visit: Payer: Commercial Managed Care - HMO | Admitting: Internal Medicine

## 2015-02-23 ENCOUNTER — Ambulatory Visit: Payer: Commercial Managed Care - HMO | Admitting: Internal Medicine

## 2015-02-23 DIAGNOSIS — Z0289 Encounter for other administrative examinations: Secondary | ICD-10-CM

## 2015-04-09 ENCOUNTER — Other Ambulatory Visit (INDEPENDENT_AMBULATORY_CARE_PROVIDER_SITE_OTHER): Payer: Commercial Managed Care - HMO

## 2015-04-09 ENCOUNTER — Encounter: Payer: Self-pay | Admitting: Internal Medicine

## 2015-04-09 ENCOUNTER — Ambulatory Visit (INDEPENDENT_AMBULATORY_CARE_PROVIDER_SITE_OTHER): Payer: Commercial Managed Care - HMO | Admitting: Internal Medicine

## 2015-04-09 VITALS — BP 110/72 | HR 109 | Wt 204.0 lb

## 2015-04-09 DIAGNOSIS — E669 Obesity, unspecified: Secondary | ICD-10-CM

## 2015-04-09 DIAGNOSIS — E1165 Type 2 diabetes mellitus with hyperglycemia: Secondary | ICD-10-CM | POA: Diagnosis not present

## 2015-04-09 DIAGNOSIS — E538 Deficiency of other specified B group vitamins: Secondary | ICD-10-CM

## 2015-04-09 DIAGNOSIS — I10 Essential (primary) hypertension: Secondary | ICD-10-CM

## 2015-04-09 DIAGNOSIS — IMO0002 Reserved for concepts with insufficient information to code with codable children: Secondary | ICD-10-CM

## 2015-04-09 DIAGNOSIS — M153 Secondary multiple arthritis: Secondary | ICD-10-CM

## 2015-04-09 DIAGNOSIS — F79 Unspecified intellectual disabilities: Secondary | ICD-10-CM

## 2015-04-09 LAB — BASIC METABOLIC PANEL
BUN: 17 mg/dL (ref 6–23)
CALCIUM: 10 mg/dL (ref 8.4–10.5)
CHLORIDE: 103 meq/L (ref 96–112)
CO2: 27 mEq/L (ref 19–32)
CREATININE: 1.12 mg/dL (ref 0.40–1.50)
GFR: 86.3 mL/min (ref >60.00)
Glucose, Bld: 174 mg/dL — ABNORMAL HIGH (ref 70–99)
Potassium: 4.1 mEq/L (ref 3.5–5.1)
Sodium: 139 mEq/L (ref 135–145)

## 2015-04-09 LAB — HEMOGLOBIN A1C: HEMOGLOBIN A1C: 8.3 % — AB (ref 4.6–6.5)

## 2015-04-09 LAB — TSH: TSH: 1.01 u[IU]/mL (ref 0.35–4.50)

## 2015-04-09 MED ORDER — PENICILLIN V POTASSIUM 500 MG PO TABS: ORAL_TABLET | ORAL | Status: DC

## 2015-04-09 MED ORDER — HYDROCODONE-ACETAMINOPHEN 5-325 MG PO TABS: 1.0000 | ORAL_TABLET | Freq: Two times a day (BID) | ORAL | Status: DC | PRN

## 2015-04-09 NOTE — Progress Notes (Signed)
Subjective:  Patient ID: Timothy Schmidt, male    DOB: 1956-07-25  Age: 59 y.o. MRN: [id number]  CC: No chief complaint on file.   HPI Timothy Schmidt presents for DM, HTN, depression, LBP f/u.   Outpatient Prescriptions Prior to Visit  Medication Sig Dispense Refill  . Alcohol Swabs (B-D SINGLE USE SWABS BUTTERFLY) PADS Use one two times daily.Dx: 250.00 100 each 3  . Blood Glucose Calibration (PRODIGY CONTROL SOLUTION) LOW SOLN Use as directed per meter instructions. 3 each 0  . Blood Glucose Monitoring Suppl (PRODIGY AUTOCODE BLOOD GLUCOSE) W/DEVICE KIT Use two times daily as directed. 1 each 0  . cholecalciferol (VITAMIN D) 1000 UNITS tablet Take 1 tablet (1,000 Units total) by mouth daily. 90 tablet 3  . cyanocobalamin 1000 MCG tablet Take 0.5 tablets (500 mcg total) by mouth daily. 90 tablet 3  . glipiZIDE (GLUCOTROL) 5 MG tablet TAKE ONE TABLET BY MOUTH TWICE DAILY BEFORE A MEAL 180 tablet 3  . glucose blood (PRODIGY AUTOCODE TEST) test strip Use one two times daily. Dx: 250.00 100 each 3  . KLOR-CON M20 20 MEQ tablet TAKE ONE TABLET BY MOUTH ONCE DAILY 90 tablet 3  . metFORMIN (GLUCOPHAGE) 1000 MG tablet TAKE ONE TABLET BY MOUTH TWICE DAILY WITH MEALS 180 tablet 3  . PRODIGY TWIST TOP LANCETS 28G MISC Use one two times daily. Dx: 250.00 100 each 3  . triamterene-hydrochlorothiazide (MAXZIDE-25) 37.5-25 MG per tablet TAKE ONE TABLET BY MOUTH ONCE DAILY 90 tablet 3  . HYDROcodone-acetaminophen (NORCO/VICODIN) 5-325 MG per tablet Take 1 tablet by mouth 2 (two) times daily as needed for moderate pain. 60 tablet 0  . buPROPion (WELLBUTRIN SR) 150 MG 12 hr tablet Take 1 tablet (150 mg total) by mouth daily. (Patient not taking: Reported on 04/09/2015) 90 tablet 3  . Chromium 200 MCG TABS Take 2 tablets by mouth daily.    Marland Kitchen CINNAMON PO Take 1 tablet by mouth daily.     No facility-administered medications prior to visit.    ROS Review of Systems  Constitutional: Negative for  appetite change, fatigue and unexpected weight change.  HENT: Negative for congestion, nosebleeds, sneezing, sore throat and trouble swallowing.   Eyes: Negative for itching and visual disturbance.  Respiratory: Negative for cough.   Cardiovascular: Negative for chest pain, palpitations and leg swelling.  Gastrointestinal: Negative for nausea, diarrhea, blood in stool and abdominal distention.  Genitourinary: Negative for frequency and hematuria.  Musculoskeletal: Negative for back pain, joint swelling, gait problem and neck pain.  Skin: Negative for rash.  Neurological: Negative for dizziness, tremors, speech difficulty and weakness.  Psychiatric/Behavioral: Negative for sleep disturbance, dysphoric mood and agitation. The patient is not nervous/anxious.     Objective:  BP 110/72 mmHg  Pulse 109  Wt 204 lb (92.534 kg)  SpO2 97%  BP Readings from Last 3 Encounters:  04/09/15 110/72  10/12/14 124/70  08/28/14 120/82    Wt Readings from Last 3 Encounters:  04/09/15 204 lb (92.534 kg)  10/12/14 209 lb (94.802 kg)  08/28/14 209 lb (94.802 kg)    Physical Exam  Constitutional: He is oriented to person, place, and time. He appears well-developed. No distress.  NAD  HENT:  Mouth/Throat: Oropharynx is clear and moist.  Eyes: Conjunctivae are normal. Pupils are equal, round, and reactive to light.  Neck: Normal range of motion. No JVD present. No thyromegaly present.  Cardiovascular: Normal rate, regular rhythm, normal heart sounds and intact distal pulses.  Exam  reveals no gallop and no friction rub.   No murmur heard. Pulmonary/Chest: Effort normal and breath sounds normal. No respiratory distress. He has no wheezes. He has no rales. He exhibits no tenderness.  Abdominal: Soft. Bowel sounds are normal. He exhibits no distension and no mass. There is no tenderness. There is no rebound and no guarding.  Musculoskeletal: Normal range of motion. He exhibits no edema or tenderness.    Lymphadenopathy:    He has no cervical adenopathy.  Neurological: He is alert and oriented to person, place, and time. He has normal reflexes. No cranial nerve deficit. He exhibits normal muscle tone. He displays a negative Romberg sign. Coordination and gait normal.  Skin: Skin is warm and dry. No rash noted.  Psychiatric: He has a normal mood and affect. His behavior is normal. Judgment and thought content normal.    Lab Results  Component Value Date   WBC 9.4 08/04/2011   HGB 15.6 08/04/2011   HCT 46.0 08/04/2011   PLT 239.0 08/04/2011   GLUCOSE 197* 08/28/2014   CHOL 156 01/02/2014   TRIG 265.0* 01/02/2014   HDL 44.40 01/02/2014   LDLDIRECT 71.5 06/29/2010   LDLCALC 59 01/02/2014   ALT 16 08/28/2014   AST 16 08/28/2014   NA 137 08/28/2014   K 3.8 08/28/2014   CL 103 08/28/2014   CREATININE 1.1 08/28/2014   BUN 19 08/28/2014   CO2 27 08/28/2014   TSH 0.97 05/05/2011   PSA 1.71 10/28/2010   HGBA1C 8.8* 08/28/2014    Dg Chest 2 View  09/22/2010   *RADIOLOGY REPORT*  Clinical Data: Shoulder spasm was obtained.  CHEST - 2 VIEW  Comparison: 08/05/2009  Findings: Normal heart size.  Linear scar at the left base.  Right lung is clear.  No pneumothorax or pleural effusion.  No acute bony deformity.  IMPRESSION: No active cardiopulmonary disease.  Original Report Authenticated By: Jamas Lav, M.D.  Dg Shoulder Left  09/22/2010   *RADIOLOGY REPORT*  Clinical Data: Shoulder spasm was and pain  LEFT SHOULDER - 2+ VIEW  Comparison: None.  Findings: No acute fracture.  No dislocation.  Unremarkable soft tissues.  IMPRESSION: No acute bony pathology.  Original Report Authenticated By: Jamas Lav, M.D.   Assessment & Plan:   There are no diagnoses linked to this encounter. I have changed Mr. Timothy Schmidt HYDROcodone-acetaminophen. I am also having him maintain his cholecalciferol, CINNAMON PO, Chromium, buPROPion, cyanocobalamin, Prodigy Autocode Blood Glucose, glucose blood,  PRODIGY TWIST TOP LANCETS 28G, PRODIGY CONTROL SOLUTION, B-D SINGLE USE SWABS BUTTERFLY, glipiZIDE, metFORMIN, triamterene-hydrochlorothiazide, KLOR-CON M20, aspirin EC, and penicillin v potassium.  Meds ordered this encounter  Medications  . aspirin EC 81 MG tablet    Sig: Take 81 mg by mouth daily.  Marland Kitchen HYDROcodone-acetaminophen (NORCO/VICODIN) 5-325 MG per tablet    Sig: Take 1 tablet by mouth 2 (two) times daily as needed for severe pain.    Dispense:  60 tablet    Refill:  0  . penicillin v potassium (VEETID) 500 MG tablet    Sig: TAKE ONE TABLET BY MOUTH FOUR TIMES DAILY    Dispense:  120 tablet    Refill:  0     Follow-up: No Follow-up on file.  Walker Kehr, MD

## 2015-04-09 NOTE — Assessment & Plan Note (Signed)
Metformin Glipizide 

## 2015-04-09 NOTE — Assessment & Plan Note (Signed)
Timothy Schmidt asked me to help to fill out his SS disability fiorm

## 2015-04-09 NOTE — Assessment & Plan Note (Signed)
Vit B12 

## 2015-04-09 NOTE — Assessment & Plan Note (Signed)
On Vit B12 

## 2015-04-09 NOTE — Assessment & Plan Note (Signed)
Chronic Triamt/HCT 

## 2015-04-09 NOTE — Assessment & Plan Note (Signed)
Wt Readings from Last 3 Encounters:  04/09/15 204 lb (92.534 kg)  10/12/14 209 lb (94.802 kg)  08/28/14 209 lb (94.802 kg)

## 2015-04-09 NOTE — Progress Notes (Signed)
Pre visit review using our clinic review tool, if applicable. No additional management support is needed unless otherwise documented below in the visit note. 

## 2015-04-23 ENCOUNTER — Ambulatory Visit (INDEPENDENT_AMBULATORY_CARE_PROVIDER_SITE_OTHER): Payer: Commercial Managed Care - HMO | Admitting: Internal Medicine

## 2015-04-23 ENCOUNTER — Encounter: Payer: Self-pay | Admitting: Internal Medicine

## 2015-04-23 VITALS — BP 110/76 | HR 95 | Temp 98.8°F | Wt 206.0 lb

## 2015-04-23 DIAGNOSIS — IMO0002 Reserved for concepts with insufficient information to code with codable children: Secondary | ICD-10-CM

## 2015-04-23 DIAGNOSIS — E1165 Type 2 diabetes mellitus with hyperglycemia: Secondary | ICD-10-CM | POA: Diagnosis not present

## 2015-04-23 DIAGNOSIS — K529 Noninfective gastroenteritis and colitis, unspecified: Secondary | ICD-10-CM | POA: Diagnosis not present

## 2015-04-23 LAB — GLUCOSE, POCT (MANUAL RESULT ENTRY): POC GLUCOSE: 196 mg/dL — AB (ref 70–99)

## 2015-04-23 MED ORDER — PROMETHAZINE HCL 25 MG PO TABS: 25.0000 mg | ORAL_TABLET | Freq: Three times a day (TID) | ORAL | Status: DC | PRN

## 2015-04-23 MED ORDER — DIPHENOXYLATE-ATROPINE 2.5-0.025 MG PO TABS: 1.0000 | ORAL_TABLET | Freq: Four times a day (QID) | ORAL | Status: DC | PRN

## 2015-04-23 NOTE — Patient Instructions (Signed)
Diarrhea °Diarrhea is watery poop (stool). It can make you feel weak, tired, thirsty, or give you a dry mouth (signs of dehydration). Watery poop is a sign of another problem, most often an infection. It often lasts 2-3 days. It can last longer if it is a sign of something serious. Take care of yourself as told by your doctor. °HOME CARE  °· Drink 1 cup (8 ounces) of fluid each time you have watery poop. °· Do not drink the following fluids: °¨ Those that contain simple sugars (fructose, glucose, galactose, lactose, sucrose, maltose). °¨ Sports drinks. °¨ Fruit juices. °¨ Whole milk products. °¨ Sodas. °¨ Drinks with caffeine (coffee, tea, soda) or alcohol. °· Oral rehydration solution may be used if the doctor says it is okay. You may make your own solution. Follow this recipe: °¨  - teaspoon table salt. °¨ ¾ teaspoon baking soda. °¨  teaspoon salt substitute containing potassium chloride. °¨ 1 tablespoons sugar. °¨ 1 liter (34 ounces) of water. °· Avoid the following foods: °¨ High fiber foods, such as raw fruits and vegetables. °¨ Nuts, seeds, and whole grain breads and cereals. °¨  Those that are sweetened with sugar alcohols (xylitol, sorbitol, mannitol). °· Try eating the following foods: °¨ Starchy foods, such as rice, toast, pasta, low-sugar cereal, oatmeal, baked potatoes, crackers, and bagels. °¨ Bananas. °¨ Applesauce. °· Eat probiotic-rich foods, such as yogurt and milk products that are fermented. °· Wash your hands well after each time you have watery poop. °· Only take medicine as told by your doctor. °· Take a warm bath to help lessen burning or pain from having watery poop. °GET HELP RIGHT AWAY IF:  °· You cannot drink fluids without throwing up (vomiting). °· You keep throwing up. °· You have blood in your poop, or your poop looks black and tarry. °· You do not pee (urinate) in 6-8 hours, or there is only a small amount of very dark pee. °· You have belly (abdominal) pain that gets worse or stays  in the same spot (localizes). °· You are weak, dizzy, confused, or light-headed. °· You have a very bad headache. °· Your watery poop gets worse or does not get better. °· You have a fever or lasting symptoms for more than 2-3 days. °· You have a fever and your symptoms suddenly get worse. °MAKE SURE YOU:  °· Understand these instructions. °· Will watch your condition. °· Will get help right away if you are not doing well or get worse. °Document Released: 01/24/2008 Document Revised: 12/22/2013 Document Reviewed: 04/14/2012 °[employer]® Patient Information ©2015 [employer]. This information is not intended to replace advice given to you by your health care provider. Make sure you discuss any questions you have with your health care provider. ° °

## 2015-04-23 NOTE — Assessment & Plan Note (Signed)
9/16 ?viral vs other Lomotil prn Promethazine prn Rest

## 2015-04-23 NOTE — Assessment & Plan Note (Signed)
Improve diet 

## 2015-04-23 NOTE — Progress Notes (Signed)
Pre visit review using our clinic review tool, if applicable. No additional management support is needed unless otherwise documented below in the visit note. 

## 2015-04-23 NOTE — Addendum Note (Signed)
Addended by: Merrilyn Puma on: 04/23/2015 02:12 PM   Modules accepted: Orders

## 2015-04-23 NOTE — Progress Notes (Signed)
Subjective:  Patient ID: Timothy Schmidt, male    DOB: August 15, 1956  Age: 59 y.o. MRN: [id number]  CC: No chief complaint on file.   HPI Timothy Schmidt presents for n/v/d x 3d; abd pain, chills  Outpatient Prescriptions Prior to Visit  Medication Sig Dispense Refill  . Alcohol Swabs (B-D SINGLE USE SWABS BUTTERFLY) PADS Use one two times daily.Dx: 250.00 100 each 3  . aspirin EC 81 MG tablet Take 81 mg by mouth daily.    . Blood Glucose Calibration (PRODIGY CONTROL SOLUTION) LOW SOLN Use as directed per meter instructions. 3 each 0  . Blood Glucose Monitoring Suppl (PRODIGY AUTOCODE BLOOD GLUCOSE) W/DEVICE KIT Use two times daily as directed. 1 each 0  . buPROPion (WELLBUTRIN SR) 150 MG 12 hr tablet Take 1 tablet (150 mg total) by mouth daily. 90 tablet 3  . cholecalciferol (VITAMIN D) 1000 UNITS tablet Take 1 tablet (1,000 Units total) by mouth daily. 90 tablet 3  . Chromium 200 MCG TABS Take 2 tablets by mouth daily.    Marland Kitchen CINNAMON PO Take 1 tablet by mouth daily.    . cyanocobalamin 1000 MCG tablet Take 0.5 tablets (500 mcg total) by mouth daily. 90 tablet 3  . glipiZIDE (GLUCOTROL) 5 MG tablet TAKE ONE TABLET BY MOUTH TWICE DAILY BEFORE A MEAL 180 tablet 3  . glucose blood (PRODIGY AUTOCODE TEST) test strip Use one two times daily. Dx: 250.00 100 each 3  . HYDROcodone-acetaminophen (NORCO/VICODIN) 5-325 MG per tablet Take 1 tablet by mouth 2 (two) times daily as needed for severe pain. 60 tablet 0  . KLOR-CON M20 20 MEQ tablet TAKE ONE TABLET BY MOUTH ONCE DAILY 90 tablet 3  . metFORMIN (GLUCOPHAGE) 1000 MG tablet TAKE ONE TABLET BY MOUTH TWICE DAILY WITH MEALS 180 tablet 3  . penicillin v potassium (VEETID) 500 MG tablet TAKE ONE TABLET BY MOUTH FOUR TIMES DAILY 120 tablet 0  . PRODIGY TWIST TOP LANCETS 28G MISC Use one two times daily. Dx: 250.00 100 each 3  . triamterene-hydrochlorothiazide (MAXZIDE-25) 37.5-25 MG per tablet TAKE ONE TABLET BY MOUTH ONCE DAILY 90 tablet 3   No  facility-administered medications prior to visit.    ROS Review of Systems  Constitutional: Positive for fatigue. Negative for appetite change and unexpected weight change.  HENT: Negative for congestion, nosebleeds, sneezing, sore throat and trouble swallowing.   Eyes: Negative for itching and visual disturbance.  Respiratory: Negative for cough.   Cardiovascular: Negative for chest pain, palpitations and leg swelling.  Gastrointestinal: Positive for nausea, vomiting, abdominal pain and diarrhea. Negative for blood in stool and abdominal distention.  Genitourinary: Negative for frequency and hematuria.  Musculoskeletal: Negative for back pain, joint swelling, gait problem and neck pain.  Skin: Negative for rash.  Neurological: Negative for dizziness, tremors, speech difficulty and weakness.  Psychiatric/Behavioral: Negative for sleep disturbance, dysphoric mood and agitation. The patient is not nervous/anxious.     Objective:  BP 110/76 mmHg  Pulse 95  Temp(Src) 98.8 F (37.1 C) (Oral)  Wt 206 lb (93.441 kg)  SpO2 97%  BP Readings from Last 3 Encounters:  04/23/15 110/76  04/09/15 110/72  10/12/14 124/70    Wt Readings from Last 3 Encounters:  04/23/15 206 lb (93.441 kg)  04/09/15 204 lb (92.534 kg)  10/12/14 209 lb (94.802 kg)    Physical Exam  Constitutional: He is oriented to person, place, and time. He appears well-developed. No distress.  NAD  HENT:  Mouth/Throat: Oropharynx  is clear and moist.  Eyes: Conjunctivae are normal. Pupils are equal, round, and reactive to light.  Neck: Normal range of motion. No JVD present. No thyromegaly present.  Cardiovascular: Normal rate, regular rhythm, normal heart sounds and intact distal pulses.  Exam reveals no gallop and no friction rub.   No murmur heard. Pulmonary/Chest: Effort normal and breath sounds normal. No respiratory distress. He has no wheezes. He has no rales. He exhibits no tenderness.  Abdominal: Soft. Bowel  sounds are normal. He exhibits no distension and no mass. There is no tenderness. There is no rebound and no guarding.  Musculoskeletal: Normal range of motion. He exhibits no edema or tenderness.  Lymphadenopathy:    He has no cervical adenopathy.  Neurological: He is alert and oriented to person, place, and time. He has normal reflexes. No cranial nerve deficit. He exhibits normal muscle tone. He displays a negative Romberg sign. Coordination and gait normal.  Skin: Skin is warm and dry. No rash noted.  Psychiatric: He has a normal mood and affect. His behavior is normal. Judgment and thought content normal.   abd is sensitive to palp  Lab Results  Component Value Date   WBC 9.4 08/04/2011   HGB 15.6 08/04/2011   HCT 46.0 08/04/2011   PLT 239.0 08/04/2011   GLUCOSE 174* 04/09/2015   CHOL 156 01/02/2014   TRIG 265.0* 01/02/2014   HDL 44.40 01/02/2014   LDLDIRECT 71.5 06/29/2010   LDLCALC 59 01/02/2014   ALT 16 08/28/2014   AST 16 08/28/2014   NA 139 04/09/2015   K 4.1 04/09/2015   CL 103 04/09/2015   CREATININE 1.12 04/09/2015   BUN 17 04/09/2015   CO2 27 04/09/2015   TSH 1.01 04/09/2015   PSA 1.71 10/28/2010   HGBA1C 8.3* 04/09/2015    Dg Chest 2 View  09/22/2010   *RADIOLOGY REPORT*  Clinical Data: Shoulder spasm was obtained.  CHEST - 2 VIEW  Comparison: 08/05/2009  Findings: Normal heart size.  Linear scar at the left base.  Right lung is clear.  No pneumothorax or pleural effusion.  No acute bony deformity.  IMPRESSION: No active cardiopulmonary disease.  Original Report Authenticated By: Jamas Lav, M.D.  Dg Shoulder Left  09/22/2010   *RADIOLOGY REPORT*  Clinical Data: Shoulder spasm was and pain  LEFT SHOULDER - 2+ VIEW  Comparison: None.  Findings: No acute fracture.  No dislocation.  Unremarkable soft tissues.  IMPRESSION: No acute bony pathology.  Original Report Authenticated By: Jamas Lav, M.D.   Assessment & Plan:   There are no diagnoses linked to  this encounter. I am having Mr. Arwood maintain his cholecalciferol, CINNAMON PO, Chromium, buPROPion, cyanocobalamin, Prodigy Autocode Blood Glucose, glucose blood, PRODIGY TWIST TOP LANCETS 28G, PRODIGY CONTROL SOLUTION, B-D SINGLE USE SWABS BUTTERFLY, glipiZIDE, metFORMIN, triamterene-hydrochlorothiazide, KLOR-CON M20, aspirin EC, HYDROcodone-acetaminophen, and penicillin v potassium.  No orders of the defined types were placed in this encounter.     Follow-up: No Follow-up on file.  Walker Kehr, MD

## 2015-04-27 ENCOUNTER — Ambulatory Visit: Payer: Commercial Managed Care - HMO | Admitting: Internal Medicine

## 2015-04-28 ENCOUNTER — Ambulatory Visit: Payer: Commercial Managed Care - HMO | Admitting: Internal Medicine

## 2015-04-30 ENCOUNTER — Ambulatory Visit: Payer: Commercial Managed Care - HMO | Admitting: Internal Medicine

## 2015-05-04 ENCOUNTER — Ambulatory Visit (INDEPENDENT_AMBULATORY_CARE_PROVIDER_SITE_OTHER): Payer: Commercial Managed Care - HMO | Admitting: Internal Medicine

## 2015-05-04 ENCOUNTER — Encounter: Payer: Self-pay | Admitting: Internal Medicine

## 2015-05-04 VITALS — BP 108/60 | HR 111 | Temp 99.4°F | Wt 205.0 lb

## 2015-05-04 DIAGNOSIS — M5440 Lumbago with sciatica, unspecified side: Secondary | ICD-10-CM | POA: Diagnosis not present

## 2015-05-04 DIAGNOSIS — E538 Deficiency of other specified B group vitamins: Secondary | ICD-10-CM

## 2015-05-04 DIAGNOSIS — F411 Generalized anxiety disorder: Secondary | ICD-10-CM

## 2015-05-04 DIAGNOSIS — E1165 Type 2 diabetes mellitus with hyperglycemia: Secondary | ICD-10-CM

## 2015-05-04 DIAGNOSIS — R197 Diarrhea, unspecified: Secondary | ICD-10-CM | POA: Insufficient documentation

## 2015-05-04 DIAGNOSIS — IMO0002 Reserved for concepts with insufficient information to code with codable children: Secondary | ICD-10-CM

## 2015-05-04 LAB — GLUCOSE, POCT (MANUAL RESULT ENTRY): POC GLUCOSE: 349 mg/dL — AB (ref 70–99)

## 2015-05-04 MED ORDER — DIPHENOXYLATE-ATROPINE 2.5-0.025 MG PO TABS: 1.0000 | ORAL_TABLET | Freq: Four times a day (QID) | ORAL | Status: DC | PRN

## 2015-05-04 NOTE — Progress Notes (Signed)
Pre visit review using our clinic review tool, if applicable. No additional management support is needed unless otherwise documented below in the visit note. 

## 2015-05-04 NOTE — Assessment & Plan Note (Signed)
Chronic   Potential benefits of a long term wellbutrin use as well as potential risks  and complications were explained to the patient and were aknowledged.  

## 2015-05-04 NOTE — Progress Notes (Signed)
Subjective:  Patient ID: Timothy Schmidt, male    DOB: 08/09/1956  Age: 59 y.o. MRN: [id number]  CC: No chief complaint on file.   HPI Timothy Schmidt presents for sinusitis, cough, diarrhea  Outpatient Prescriptions Prior to Visit  Medication Sig Dispense Refill  . Alcohol Swabs (B-D SINGLE USE SWABS BUTTERFLY) PADS Use one two times daily.Dx: 250.00 100 each 3  . aspirin EC 81 MG tablet Take 81 mg by mouth daily.    . Blood Glucose Calibration (PRODIGY CONTROL SOLUTION) LOW SOLN Use as directed per meter instructions. 3 each 0  . Blood Glucose Monitoring Suppl (PRODIGY AUTOCODE BLOOD GLUCOSE) W/DEVICE KIT Use two times daily as directed. 1 each 0  . buPROPion (WELLBUTRIN SR) 150 MG 12 hr tablet Take 1 tablet (150 mg total) by mouth daily. 90 tablet 3  . cholecalciferol (VITAMIN D) 1000 UNITS tablet Take 1 tablet (1,000 Units total) by mouth daily. 90 tablet 3  . Chromium 200 MCG TABS Take 2 tablets by mouth daily.    Marland Kitchen CINNAMON PO Take 1 tablet by mouth daily.    . cyanocobalamin 1000 MCG tablet Take 0.5 tablets (500 mcg total) by mouth daily. 90 tablet 3  . diphenoxylate-atropine (LOMOTIL) 2.5-0.025 MG per tablet Take 1 tablet by mouth 4 (four) times daily as needed for diarrhea or loose stools. 60 tablet 0  . glipiZIDE (GLUCOTROL) 5 MG tablet TAKE ONE TABLET BY MOUTH TWICE DAILY BEFORE A MEAL 180 tablet 3  . glucose blood (PRODIGY AUTOCODE TEST) test strip Use one two times daily. Dx: 250.00 100 each 3  . HYDROcodone-acetaminophen (NORCO/VICODIN) 5-325 MG per tablet Take 1 tablet by mouth 2 (two) times daily as needed for severe pain. 60 tablet 0  . KLOR-CON M20 20 MEQ tablet TAKE ONE TABLET BY MOUTH ONCE DAILY 90 tablet 3  . metFORMIN (GLUCOPHAGE) 1000 MG tablet TAKE ONE TABLET BY MOUTH TWICE DAILY WITH MEALS 180 tablet 3  . penicillin v potassium (VEETID) 500 MG tablet TAKE ONE TABLET BY MOUTH FOUR TIMES DAILY 120 tablet 0  . PRODIGY TWIST TOP LANCETS 28G MISC Use one two times  daily. Dx: 250.00 100 each 3  . promethazine (PHENERGAN) 25 MG tablet Take 1 tablet (25 mg total) by mouth every 8 (eight) hours as needed for nausea or vomiting. 40 tablet 0  . triamterene-hydrochlorothiazide (MAXZIDE-25) 37.5-25 MG per tablet TAKE ONE TABLET BY MOUTH ONCE DAILY 90 tablet 3   No facility-administered medications prior to visit.    ROS Review of Systems  Constitutional: Positive for fatigue. Negative for appetite change and unexpected weight change.  HENT: Negative for congestion, nosebleeds, sneezing, sore throat and trouble swallowing.   Eyes: Negative for itching and visual disturbance.  Respiratory: Positive for cough. Negative for stridor.   Cardiovascular: Negative for chest pain, palpitations and leg swelling.  Gastrointestinal: Positive for diarrhea. Negative for nausea, blood in stool and abdominal distention.  Genitourinary: Negative for frequency and hematuria.  Musculoskeletal: Negative for back pain, joint swelling, gait problem and neck pain.  Skin: Negative for rash.  Neurological: Negative for dizziness, tremors, speech difficulty and weakness.  Psychiatric/Behavioral: Negative for sleep disturbance, dysphoric mood and agitation. The patient is not nervous/anxious.     Objective:  BP 108/60 mmHg  Pulse 111  Temp(Src) 99.4 F (37.4 C) (Oral)  Wt 205 lb (92.987 kg)  SpO2 96%  BP Readings from Last 3 Encounters:  05/04/15 108/60  04/23/15 110/76  04/09/15 110/72  Wt Readings from Last 3 Encounters:  05/04/15 205 lb (92.987 kg)  04/23/15 206 lb (93.441 kg)  04/09/15 204 lb (92.534 kg)    Physical Exam  Constitutional: He is oriented to person, place, and time. He appears well-developed. No distress.  NAD  HENT:  Mouth/Throat: Oropharynx is clear and moist.  Eyes: Conjunctivae are normal. Pupils are equal, round, and reactive to light.  Neck: Normal range of motion. No JVD present. No thyromegaly present.  Cardiovascular: Normal rate,  regular rhythm, normal heart sounds and intact distal pulses.  Exam reveals no gallop and no friction rub.   No murmur heard. Pulmonary/Chest: Effort normal and breath sounds normal. No respiratory distress. He has no wheezes. He has no rales. He exhibits tenderness.  Abdominal: Soft. Bowel sounds are normal. He exhibits no distension and no mass. There is no tenderness. There is no rebound and no guarding.  Musculoskeletal: Normal range of motion. He exhibits no edema or tenderness.  Lymphadenopathy:    He has no cervical adenopathy.  Neurological: He is alert and oriented to person, place, and time. He has normal reflexes. No cranial nerve deficit. He exhibits normal muscle tone. He displays a negative Romberg sign. Coordination and gait normal.  Skin: Skin is warm and dry. No rash noted.  Psychiatric: He has a normal mood and affect. His behavior is normal. Judgment normal.    Lab Results  Component Value Date   WBC 9.4 08/04/2011   HGB 15.6 08/04/2011   HCT 46.0 08/04/2011   PLT 239.0 08/04/2011   GLUCOSE 174* 04/09/2015   CHOL 156 01/02/2014   TRIG 265.0* 01/02/2014   HDL 44.40 01/02/2014   LDLDIRECT 71.5 06/29/2010   LDLCALC 59 01/02/2014   ALT 16 08/28/2014   AST 16 08/28/2014   NA 139 04/09/2015   K 4.1 04/09/2015   CL 103 04/09/2015   CREATININE 1.12 04/09/2015   BUN 17 04/09/2015   CO2 27 04/09/2015   TSH 1.01 04/09/2015   PSA 1.71 10/28/2010   HGBA1C 8.3* 04/09/2015    Dg Chest 2 View  09/22/2010   *RADIOLOGY REPORT*  Clinical Data: Shoulder spasm was obtained.  CHEST - 2 VIEW  Comparison: 08/05/2009  Findings: Normal heart size.  Linear scar at the left base.  Right lung is clear.  No pneumothorax or pleural effusion.  No acute bony deformity.  IMPRESSION: No active cardiopulmonary disease.  Original Report Authenticated By: Jamas Lav, M.D.  Dg Shoulder Left  09/22/2010   *RADIOLOGY REPORT*  Clinical Data: Shoulder spasm was and pain  LEFT SHOULDER - 2+ VIEW   Comparison: None.  Findings: No acute fracture.  No dislocation.  Unremarkable soft tissues.  IMPRESSION: No acute bony pathology.  Original Report Authenticated By: Jamas Lav, M.D.   Assessment & Plan:   There are no diagnoses linked to this encounter. I am having Mr. Capizzi maintain his cholecalciferol, CINNAMON PO, Chromium, buPROPion, cyanocobalamin, Prodigy Autocode Blood Glucose, glucose blood, PRODIGY TWIST TOP LANCETS 28G, PRODIGY CONTROL SOLUTION, B-D SINGLE USE SWABS BUTTERFLY, glipiZIDE, metFORMIN, triamterene-hydrochlorothiazide, KLOR-CON M20, aspirin EC, HYDROcodone-acetaminophen, penicillin v potassium, diphenoxylate-atropine, and promethazine.  No orders of the defined types were placed in this encounter.     Follow-up: No Follow-up on file.  Walker Kehr, MD

## 2015-05-04 NOTE — Assessment & Plan Note (Signed)
Align Lomotil prn

## 2015-05-04 NOTE — Patient Instructions (Signed)
Align 1 a day 

## 2015-05-04 NOTE — Assessment & Plan Note (Signed)
Chronic, poor control Metformin Glipizide

## 2015-05-04 NOTE — Assessment & Plan Note (Signed)
Chronic MSK/OA  Potential benefits of a long term prn opioids use as well as potential risks (i.e. addiction risk, apnea etc) and complications (i.e. Somnolence, constipation and others) were explained to the patient and were aknowledged. 

## 2015-05-04 NOTE — Assessment & Plan Note (Signed)
On Vit B12 

## 2015-07-09 ENCOUNTER — Encounter: Payer: Self-pay | Admitting: Internal Medicine

## 2015-07-09 ENCOUNTER — Ambulatory Visit (INDEPENDENT_AMBULATORY_CARE_PROVIDER_SITE_OTHER): Payer: Commercial Managed Care - HMO | Admitting: Internal Medicine

## 2015-07-09 ENCOUNTER — Other Ambulatory Visit (INDEPENDENT_AMBULATORY_CARE_PROVIDER_SITE_OTHER): Payer: Commercial Managed Care - HMO

## 2015-07-09 VITALS — BP 118/70 | HR 96 | Wt 207.0 lb

## 2015-07-09 DIAGNOSIS — I10 Essential (primary) hypertension: Secondary | ICD-10-CM

## 2015-07-09 DIAGNOSIS — E1165 Type 2 diabetes mellitus with hyperglycemia: Secondary | ICD-10-CM

## 2015-07-09 DIAGNOSIS — IMO0002 Reserved for concepts with insufficient information to code with codable children: Secondary | ICD-10-CM

## 2015-07-09 DIAGNOSIS — E538 Deficiency of other specified B group vitamins: Secondary | ICD-10-CM | POA: Diagnosis not present

## 2015-07-09 DIAGNOSIS — E1140 Type 2 diabetes mellitus with diabetic neuropathy, unspecified: Secondary | ICD-10-CM

## 2015-07-09 DIAGNOSIS — F4323 Adjustment disorder with mixed anxiety and depressed mood: Secondary | ICD-10-CM

## 2015-07-09 DIAGNOSIS — M5440 Lumbago with sciatica, unspecified side: Secondary | ICD-10-CM | POA: Diagnosis not present

## 2015-07-09 DIAGNOSIS — G8929 Other chronic pain: Secondary | ICD-10-CM

## 2015-07-09 LAB — HEMOGLOBIN A1C: Hgb A1c MFr Bld: 8.7 % — ABNORMAL HIGH (ref 4.6–6.5)

## 2015-07-09 NOTE — Assessment & Plan Note (Signed)
Chronic Triamt/HCT 

## 2015-07-09 NOTE — Progress Notes (Signed)
Subjective:  Patient ID: Timothy Schmidt, male    DOB: [birth year]  Age: 59 y.o. MRN: [id number]  CC: No chief complaint on file.   HPI Timothy Schmidt presents for DM, OA/LBP, HTN f/u  Outpatient Prescriptions Prior to Visit  Medication Sig Dispense Refill  . Alcohol Swabs (B-D SINGLE USE SWABS BUTTERFLY) PADS Use one two times daily.Dx: 250.00 100 each 3  . aspirin EC 81 MG tablet Take 81 mg by mouth daily.    . Blood Glucose Calibration (PRODIGY CONTROL SOLUTION) LOW SOLN Use as directed per meter instructions. 3 each 0  . Blood Glucose Monitoring Suppl (PRODIGY AUTOCODE BLOOD GLUCOSE) W/DEVICE KIT Use two times daily as directed. 1 each 0  . buPROPion (WELLBUTRIN SR) 150 MG 12 hr tablet Take 1 tablet (150 mg total) by mouth daily. 90 tablet 3  . cholecalciferol (VITAMIN D) 1000 UNITS tablet Take 1 tablet (1,000 Units total) by mouth daily. 90 tablet 3  . Chromium 200 MCG TABS Take 2 tablets by mouth daily.    Marland Kitchen CINNAMON PO Take 1 tablet by mouth daily.    . cyanocobalamin 1000 MCG tablet Take 0.5 tablets (500 mcg total) by mouth daily. 90 tablet 3  . diphenoxylate-atropine (LOMOTIL) 2.5-0.025 MG per tablet Take 1 tablet by mouth 4 (four) times daily as needed for diarrhea or loose stools. 60 tablet 0  . glipiZIDE (GLUCOTROL) 5 MG tablet TAKE ONE TABLET BY MOUTH TWICE DAILY BEFORE A MEAL 180 tablet 3  . glucose blood (PRODIGY AUTOCODE TEST) test strip Use one two times daily. Dx: 250.00 100 each 3  . HYDROcodone-acetaminophen (NORCO/VICODIN) 5-325 MG per tablet Take 1 tablet by mouth 2 (two) times daily as needed for severe pain. 60 tablet 0  . KLOR-CON M20 20 MEQ tablet TAKE ONE TABLET BY MOUTH ONCE DAILY 90 tablet 3  . metFORMIN (GLUCOPHAGE) 1000 MG tablet TAKE ONE TABLET BY MOUTH TWICE DAILY WITH MEALS 180 tablet 3  . penicillin v potassium (VEETID) 500 MG tablet TAKE ONE TABLET BY MOUTH FOUR TIMES DAILY 120 tablet 0  . PRODIGY TWIST TOP LANCETS 28G MISC Use one two times daily.  Dx: 250.00 100 each 3  . promethazine (PHENERGAN) 25 MG tablet Take 1 tablet (25 mg total) by mouth every 8 (eight) hours as needed for nausea or vomiting. 40 tablet 0  . triamterene-hydrochlorothiazide (MAXZIDE-25) 37.5-25 MG per tablet TAKE ONE TABLET BY MOUTH ONCE DAILY 90 tablet 3   No facility-administered medications prior to visit.    ROS Review of Systems  Constitutional: Negative for appetite change, fatigue and unexpected weight change.  HENT: Negative for congestion, nosebleeds, sneezing, sore throat and trouble swallowing.   Eyes: Negative for itching and visual disturbance.  Respiratory: Negative for cough.   Cardiovascular: Negative for chest pain, palpitations and leg swelling.  Gastrointestinal: Negative for nausea, diarrhea, blood in stool and abdominal distention.  Genitourinary: Negative for frequency and hematuria.  Musculoskeletal: Positive for back pain. Negative for joint swelling, gait problem and neck pain.  Skin: Negative for rash.  Neurological: Negative for dizziness, tremors, speech difficulty and weakness.  Psychiatric/Behavioral: Negative for suicidal ideas, sleep disturbance, dysphoric mood and agitation. The patient is not nervous/anxious.     Objective:  BP 118/70 mmHg  Pulse 96  Wt 207 lb (93.895 kg)  SpO2 97%  BP Readings from Last 3 Encounters:  07/09/15 118/70  05/04/15 108/60  04/23/15 110/76    Wt Readings from Last 3 Encounters:  07/09/15 207  lb (93.895 kg)  05/04/15 205 lb (92.987 kg)  04/23/15 206 lb (93.441 kg)    Physical Exam  Constitutional: He is oriented to person, place, and time. He appears well-developed. No distress.  NAD  HENT:  Mouth/Throat: Oropharynx is clear and moist.  Eyes: Conjunctivae are normal. Pupils are equal, round, and reactive to light.  Neck: Normal range of motion. No JVD present. No thyromegaly present.  Cardiovascular: Normal rate, regular rhythm, normal heart sounds and intact distal pulses.  Exam  reveals no gallop and no friction rub.   No murmur heard. Pulmonary/Chest: Effort normal and breath sounds normal. No respiratory distress. He has no wheezes. He has no rales. He exhibits no tenderness.  Abdominal: Soft. Bowel sounds are normal. He exhibits no distension and no mass. There is no tenderness. There is no rebound and no guarding.  Musculoskeletal: Normal range of motion. He exhibits tenderness. He exhibits no edema.  Lymphadenopathy:    He has no cervical adenopathy.  Neurological: He is alert and oriented to person, place, and time. He has normal reflexes. No cranial nerve deficit. He exhibits normal muscle tone. He displays a negative Romberg sign. Coordination and gait normal.  Skin: Skin is warm and dry. No rash noted.  Psychiatric: He has a normal mood and affect. His behavior is normal. Judgment and thought content normal.  Obese LS is less tender caries  Lab Results  Component Value Date   WBC 9.4 08/04/2011   HGB 15.6 08/04/2011   HCT 46.0 08/04/2011   PLT 239.0 08/04/2011   GLUCOSE 174* 04/09/2015   CHOL 156 01/02/2014   TRIG 265.0* 01/02/2014   HDL 44.40 01/02/2014   LDLDIRECT 71.5 06/29/2010   LDLCALC 59 01/02/2014   ALT 16 08/28/2014   AST 16 08/28/2014   NA 139 04/09/2015   K 4.1 04/09/2015   CL 103 04/09/2015   CREATININE 1.12 04/09/2015   BUN 17 04/09/2015   CO2 27 04/09/2015   TSH 1.01 04/09/2015   PSA 1.71 10/28/2010   HGBA1C 8.3* 04/09/2015    Dg Chest 2 View  09/22/2010  *RADIOLOGY REPORT* Clinical Data: Shoulder spasm was obtained. CHEST - 2 VIEW Comparison: 08/05/2009 Findings: Normal heart size.  Linear scar at the left base.  Right lung is clear.  No pneumothorax or pleural effusion.  No acute bony deformity. IMPRESSION: No active cardiopulmonary disease. Original Report Authenticated By: Jamas Lav, M.D.  Dg Shoulder Left  09/22/2010  *RADIOLOGY REPORT* Clinical Data: Shoulder spasm was and pain LEFT SHOULDER - 2+ VIEW Comparison:  None. Findings: No acute fracture.  No dislocation.  Unremarkable soft tissues. IMPRESSION: No acute bony pathology. Original Report Authenticated By: Jamas Lav, M.D.   Assessment & Plan:   Diagnoses and all orders for this visit:  Uncontrolled type 2 diabetes mellitus with diabetic neuropathy, without long-term current use of insulin (HCC) -     Hemoglobin A1c; Standing  B12 deficiency  Essential hypertension  Chronic midline low back pain with sciatica, sciatica laterality unspecified  Adjustment disorder with mixed anxiety and depressed mood  I am having Mr. Borba maintain his cholecalciferol, CINNAMON PO, Chromium, buPROPion, cyanocobalamin, Prodigy Autocode Blood Glucose, glucose blood, PRODIGY TWIST TOP LANCETS 28G, PRODIGY CONTROL SOLUTION, B-D SINGLE USE SWABS BUTTERFLY, glipiZIDE, metFORMIN, triamterene-hydrochlorothiazide, KLOR-CON M20, aspirin EC, HYDROcodone-acetaminophen, penicillin v potassium, promethazine, and diphenoxylate-atropine.  No orders of the defined types were placed in this encounter.     Follow-up: Return in about 3 months (around 10/09/2015) for a  follow-up visit.  Walker Kehr, MD

## 2015-07-09 NOTE — Assessment & Plan Note (Signed)
Metformin Glipizide

## 2015-07-09 NOTE — Assessment & Plan Note (Signed)
Chronic. 

## 2015-07-09 NOTE — Assessment & Plan Note (Signed)
On B12 

## 2015-07-09 NOTE — Progress Notes (Signed)
Pre visit review using our clinic review tool, if applicable. No additional management support is needed unless otherwise documented below in the visit note. 

## 2015-09-24 ENCOUNTER — Other Ambulatory Visit: Payer: Self-pay | Admitting: Internal Medicine

## 2015-10-06 ENCOUNTER — Other Ambulatory Visit: Payer: Self-pay | Admitting: Internal Medicine

## 2015-10-15 ENCOUNTER — Ambulatory Visit: Payer: Commercial Managed Care - HMO | Admitting: Internal Medicine

## 2015-10-20 ENCOUNTER — Other Ambulatory Visit: Payer: Self-pay | Admitting: Internal Medicine

## 2015-10-20 ENCOUNTER — Telehealth: Payer: Self-pay

## 2015-10-20 DIAGNOSIS — Z7689 Persons encountering health services in other specified circumstances: Secondary | ICD-10-CM

## 2015-10-20 NOTE — Telephone Encounter (Signed)
Handicap Placard received, completed, and placed on MD's desk for signature

## 2015-10-21 NOTE — Telephone Encounter (Signed)
Paperwork signed, placed in cabinet for pt pick up. Pt advised of same

## 2016-01-04 ENCOUNTER — Other Ambulatory Visit: Payer: Self-pay | Admitting: Internal Medicine

## 2016-04-03 ENCOUNTER — Other Ambulatory Visit: Payer: Self-pay | Admitting: Internal Medicine

## 2016-04-09 ENCOUNTER — Other Ambulatory Visit: Payer: Self-pay | Admitting: Internal Medicine

## 2016-04-14 ENCOUNTER — Other Ambulatory Visit: Payer: Commercial Managed Care - HMO

## 2016-04-14 ENCOUNTER — Ambulatory Visit (INDEPENDENT_AMBULATORY_CARE_PROVIDER_SITE_OTHER): Payer: Commercial Managed Care - HMO | Admitting: Internal Medicine

## 2016-04-14 ENCOUNTER — Encounter: Payer: Self-pay | Admitting: Internal Medicine

## 2016-04-14 DIAGNOSIS — E1165 Type 2 diabetes mellitus with hyperglycemia: Secondary | ICD-10-CM

## 2016-04-14 DIAGNOSIS — M5440 Lumbago with sciatica, unspecified side: Secondary | ICD-10-CM

## 2016-04-14 DIAGNOSIS — E1140 Type 2 diabetes mellitus with diabetic neuropathy, unspecified: Secondary | ICD-10-CM

## 2016-04-14 DIAGNOSIS — IMO0002 Reserved for concepts with insufficient information to code with codable children: Secondary | ICD-10-CM

## 2016-04-14 DIAGNOSIS — F411 Generalized anxiety disorder: Secondary | ICD-10-CM

## 2016-04-14 DIAGNOSIS — G8929 Other chronic pain: Secondary | ICD-10-CM

## 2016-04-14 DIAGNOSIS — I10 Essential (primary) hypertension: Secondary | ICD-10-CM

## 2016-04-14 DIAGNOSIS — E538 Deficiency of other specified B group vitamins: Secondary | ICD-10-CM

## 2016-04-14 DIAGNOSIS — K029 Dental caries, unspecified: Secondary | ICD-10-CM

## 2016-04-14 MED ORDER — HYDROCODONE-ACETAMINOPHEN 5-325 MG PO TABS: 1.0000 | ORAL_TABLET | Freq: Two times a day (BID) | ORAL | 0 refills | Status: DC | PRN

## 2016-04-14 MED ORDER — PENICILLIN V POTASSIUM 500 MG PO TABS: ORAL_TABLET | ORAL | 0 refills | Status: DC

## 2016-04-14 NOTE — Assessment & Plan Note (Signed)
Wellbutrin Potential benefits of a long term wellbutrin use as well as potential risks  and complications were explained to the patient and were aknowledged. 

## 2016-04-14 NOTE — Progress Notes (Signed)
Subjective:  Patient ID: Timothy Schmidt, male    DOB: 22-Jan-1956  Age: 60 y.o. MRN: [id number]  CC: No chief complaint on file.   HPI Timothy Schmidt presents for DM, HTN, B12 def, LBP f/u  Outpatient Medications Prior to Visit  Medication Sig Dispense Refill  . Alcohol Swabs (B-D SINGLE USE SWABS BUTTERFLY) PADS Use one two times daily.Dx: 250.00 100 each 3  . aspirin EC 81 MG tablet Take 81 mg by mouth daily.    . Blood Glucose Calibration (PRODIGY CONTROL SOLUTION) LOW SOLN Use as directed per meter instructions. 3 each 0  . Blood Glucose Monitoring Suppl (PRODIGY AUTOCODE BLOOD GLUCOSE) W/DEVICE KIT Use two times daily as directed. 1 each 0  . buPROPion (WELLBUTRIN SR) 150 MG 12 hr tablet Take 1 tablet (150 mg total) by mouth daily. 90 tablet 3  . cholecalciferol (VITAMIN D) 1000 UNITS tablet Take 1 tablet (1,000 Units total) by mouth daily. 90 tablet 3  . Chromium 200 MCG TABS Take 2 tablets by mouth daily.    Marland Kitchen CINNAMON PO Take 1 tablet by mouth daily.    . cyanocobalamin 1000 MCG tablet Take 0.5 tablets (500 mcg total) by mouth daily. 90 tablet 3  . diphenoxylate-atropine (LOMOTIL) 2.5-0.025 MG per tablet Take 1 tablet by mouth 4 (four) times daily as needed for diarrhea or loose stools. 60 tablet 0  . glipiZIDE (GLUCOTROL) 5 MG tablet TAKE ONE TABLET BY MOUTH TWICE DAILY BEFORE MEAL(S) 180 tablet 3  . glucose blood (PRODIGY AUTOCODE TEST) test strip Use one two times daily. Dx: 250.00 100 each 3  . HYDROcodone-acetaminophen (NORCO/VICODIN) 5-325 MG per tablet Take 1 tablet by mouth 2 (two) times daily as needed for severe pain. 60 tablet 0  . KLOR-CON M20 20 MEQ tablet TAKE ONE TABLET BY MOUTH ONCE DAILY 90 tablet 2  . metFORMIN (GLUCOPHAGE) 1000 MG tablet Take 1 tablet (1,000 mg total) by mouth 2 (two) times daily with a meal. Yearly physical w/labs due in Nov must see MD for refills 180 tablet 1  . penicillin v potassium (VEETID) 500 MG tablet TAKE ONE TABLET BY MOUTH FOUR  TIMES DAILY 120 tablet 0  . PRODIGY TWIST TOP LANCETS 28G MISC Use one two times daily. Dx: 250.00 100 each 3  . promethazine (PHENERGAN) 25 MG tablet Take 1 tablet (25 mg total) by mouth every 8 (eight) hours as needed for nausea or vomiting. 40 tablet 0  . triamterene-hydrochlorothiazide (MAXZIDE-25) 37.5-25 MG tablet TAKE ONE TABLET BY MOUTH ONCE DAILY 90 tablet 0   No facility-administered medications prior to visit.     ROS Review of Systems  Constitutional: Negative for appetite change, fatigue and unexpected weight change.  HENT: Negative for congestion, nosebleeds, sneezing, sore throat and trouble swallowing.   Eyes: Negative for itching and visual disturbance.  Respiratory: Negative for cough.   Cardiovascular: Negative for chest pain, palpitations and leg swelling.  Gastrointestinal: Negative for abdominal distention, blood in stool, diarrhea and nausea.  Genitourinary: Negative for frequency and hematuria.  Musculoskeletal: Positive for arthralgias and back pain. Negative for gait problem, joint swelling and neck pain.  Skin: Negative for rash.  Neurological: Negative for dizziness, tremors, speech difficulty and weakness.  Psychiatric/Behavioral: Negative for agitation, dysphoric mood and sleep disturbance. The patient is not nervous/anxious.     Objective:  BP 110/70   Pulse 83   Wt 204 lb (92.5 kg)   SpO2 96%   BMI 31.48 kg/m   BP  Readings from Last 3 Encounters:  04/14/16 110/70  07/09/15 118/70  05/04/15 108/60    Wt Readings from Last 3 Encounters:  04/14/16 204 lb (92.5 kg)  07/09/15 207 lb (93.9 kg)  05/04/15 205 lb (93 kg)    Physical Exam  Constitutional: He is oriented to person, place, and time. He appears well-developed. No distress.  NAD  HENT:  Mouth/Throat: Oropharynx is clear and moist.  Eyes: Conjunctivae are normal. Pupils are equal, round, and reactive to light.  Neck: Normal range of motion. No JVD present. No thyromegaly present.    Cardiovascular: Normal rate, regular rhythm, normal heart sounds and intact distal pulses.  Exam reveals no gallop and no friction rub.   No murmur heard. Pulmonary/Chest: Effort normal and breath sounds normal. No respiratory distress. He has no wheezes. He has no rales. He exhibits no tenderness.  Abdominal: Soft. Bowel sounds are normal. He exhibits no distension and no mass. There is no tenderness. There is no rebound and no guarding.  Musculoskeletal: Normal range of motion. He exhibits tenderness. He exhibits no edema.  Lymphadenopathy:    He has no cervical adenopathy.  Neurological: He is alert and oriented to person, place, and time. He has normal reflexes. No cranial nerve deficit. He exhibits normal muscle tone. He displays a negative Romberg sign. Coordination and gait normal.  Skin: Skin is warm and dry. No rash noted.  Psychiatric: He has a normal mood and affect. His behavior is normal. Judgment and thought content normal.    Lab Results  Component Value Date   WBC 9.4 08/04/2011   HGB 15.6 08/04/2011   HCT 46.0 08/04/2011   PLT 239.0 08/04/2011   GLUCOSE 174 (H) 04/09/2015   CHOL 156 01/02/2014   TRIG 265.0 (H) 01/02/2014   HDL 44.40 01/02/2014   LDLDIRECT 71.5 06/29/2010   LDLCALC 59 01/02/2014   ALT 16 08/28/2014   AST 16 08/28/2014   NA 139 04/09/2015   K 4.1 04/09/2015   CL 103 04/09/2015   CREATININE 1.12 04/09/2015   BUN 17 04/09/2015   CO2 27 04/09/2015   TSH 1.01 04/09/2015   PSA 1.71 10/28/2010   HGBA1C 8.7 (H) 07/09/2015    Dg Chest 2 View  Result Date: 09/22/2010 *RADIOLOGY REPORT* Clinical Data: Shoulder spasm was obtained. CHEST - 2 VIEW Comparison: 08/05/2009 Findings: Normal heart size.  Linear scar at the left base.  Right lung is clear.  No pneumothorax or pleural effusion.  No acute bony deformity. IMPRESSION: No active cardiopulmonary disease. Original Report Authenticated By: Jamas Lav, M.D.  Dg Shoulder Left  Result Date:  09/22/2010 *RADIOLOGY REPORT* Clinical Data: Shoulder spasm was and pain LEFT SHOULDER - 2+ VIEW Comparison: None. Findings: No acute fracture.  No dislocation.  Unremarkable soft tissues. IMPRESSION: No acute bony pathology. Original Report Authenticated By: Jamas Lav, M.D.   Assessment & Plan:   There are no diagnoses linked to this encounter. I am having Mr. Hausmann maintain his cholecalciferol, CINNAMON PO, Chromium, buPROPion, cyanocobalamin, Prodigy Autocode Blood Glucose, glucose blood, PRODIGY TWIST TOP LANCETS 28G, PRODIGY CONTROL SOLUTION, B-D SINGLE USE SWABS BUTTERFLY, aspirin EC, HYDROcodone-acetaminophen, penicillin v potassium, promethazine, diphenoxylate-atropine, glipiZIDE, KLOR-CON M20, metFORMIN, and triamterene-hydrochlorothiazide.  No orders of the defined types were placed in this encounter.    Follow-up: No Follow-up on file.  Walker Kehr, MD

## 2016-04-14 NOTE — Assessment & Plan Note (Signed)
On B12 

## 2016-04-14 NOTE — Assessment & Plan Note (Signed)
Metformin Glipizide Risks associated with treatment and diet noncompliance were discussed. Compliance was encouraged. 

## 2016-04-14 NOTE — Assessment & Plan Note (Signed)
Triamt/HCT 

## 2016-04-14 NOTE — Assessment & Plan Note (Addendum)
PCN He has to see a dentist

## 2016-04-14 NOTE — Assessment & Plan Note (Signed)
Norco prn  Potential benefits of a long term prn opioids use as well as potential risks (i.e. addiction risk, apnea etc) and complications (i.e. Somnolence, constipation and others) were explained to the patient and were aknowledged.  

## 2016-04-14 NOTE — Progress Notes (Signed)
Pre visit review using our clinic review tool, if applicable. No additional management support is needed unless otherwise documented below in the visit note. 

## 2016-07-11 ENCOUNTER — Other Ambulatory Visit: Payer: Self-pay | Admitting: Internal Medicine

## 2016-07-14 ENCOUNTER — Other Ambulatory Visit: Payer: Self-pay | Admitting: Internal Medicine

## 2016-07-21 ENCOUNTER — Ambulatory Visit (INDEPENDENT_AMBULATORY_CARE_PROVIDER_SITE_OTHER): Payer: Commercial Managed Care - HMO | Admitting: Internal Medicine

## 2016-07-21 ENCOUNTER — Encounter: Payer: Self-pay | Admitting: Internal Medicine

## 2016-07-21 VITALS — BP 98/70 | HR 97 | Wt 206.0 lb

## 2016-07-21 DIAGNOSIS — G8929 Other chronic pain: Secondary | ICD-10-CM

## 2016-07-21 DIAGNOSIS — E538 Deficiency of other specified B group vitamins: Secondary | ICD-10-CM

## 2016-07-21 DIAGNOSIS — E1140 Type 2 diabetes mellitus with diabetic neuropathy, unspecified: Secondary | ICD-10-CM | POA: Diagnosis not present

## 2016-07-21 DIAGNOSIS — I10 Essential (primary) hypertension: Secondary | ICD-10-CM | POA: Diagnosis not present

## 2016-07-21 DIAGNOSIS — M5440 Lumbago with sciatica, unspecified side: Secondary | ICD-10-CM

## 2016-07-21 DIAGNOSIS — E1165 Type 2 diabetes mellitus with hyperglycemia: Secondary | ICD-10-CM

## 2016-07-21 DIAGNOSIS — IMO0002 Reserved for concepts with insufficient information to code with codable children: Secondary | ICD-10-CM

## 2016-07-21 DIAGNOSIS — E785 Hyperlipidemia, unspecified: Secondary | ICD-10-CM | POA: Diagnosis not present

## 2016-07-21 DIAGNOSIS — N320 Bladder-neck obstruction: Secondary | ICD-10-CM

## 2016-07-21 MED ORDER — HYDROCODONE-ACETAMINOPHEN 5-325 MG PO TABS: 1.0000 | ORAL_TABLET | Freq: Two times a day (BID) | ORAL | 0 refills | Status: DC | PRN

## 2016-07-21 NOTE — Progress Notes (Signed)
Subjective:  Patient ID: Timothy Schmidt, male    DOB: 03-14-1956  Age: 60 y.o. MRN: [id number]  CC: No chief complaint on file.   HPI Timothy Schmidt presents for DM, HTN, B12 def f/u  Outpatient Medications Prior to Visit  Medication Sig Dispense Refill  . Alcohol Swabs (B-D SINGLE USE SWABS BUTTERFLY) PADS Use one two times daily.Dx: 250.00 100 each 3  . aspirin EC 81 MG tablet Take 81 mg by mouth daily.    . Blood Glucose Calibration (PRODIGY CONTROL SOLUTION) LOW SOLN Use as directed per meter instructions. 3 each 0  . Blood Glucose Monitoring Suppl (PRODIGY AUTOCODE BLOOD GLUCOSE) W/DEVICE KIT Use two times daily as directed. 1 each 0  . buPROPion (WELLBUTRIN SR) 150 MG 12 hr tablet Take 1 tablet (150 mg total) by mouth daily. 90 tablet 3  . cholecalciferol (VITAMIN D) 1000 UNITS tablet Take 1 tablet (1,000 Units total) by mouth daily. 90 tablet 3  . Chromium 200 MCG TABS Take 2 tablets by mouth daily.    Marland Kitchen CINNAMON PO Take 1 tablet by mouth daily.    . cyanocobalamin 1000 MCG tablet Take 0.5 tablets (500 mcg total) by mouth daily. 90 tablet 3  . diphenoxylate-atropine (LOMOTIL) 2.5-0.025 MG per tablet Take 1 tablet by mouth 4 (four) times daily as needed for diarrhea or loose stools. 60 tablet 0  . glipiZIDE (GLUCOTROL) 5 MG tablet TAKE ONE TABLET BY MOUTH TWICE DAILY BEFORE MEAL(S) 180 tablet 3  . glucose blood (PRODIGY AUTOCODE TEST) test strip Use one two times daily. Dx: 250.00 100 each 3  . HYDROcodone-acetaminophen (NORCO/VICODIN) 5-325 MG tablet Take 1 tablet by mouth 2 (two) times daily as needed for severe pain. 60 tablet 0  . KLOR-CON M20 20 MEQ tablet TAKE ONE TABLET BY MOUTH ONCE DAILY 30 tablet 8  . metFORMIN (GLUCOPHAGE) 1000 MG tablet Take 1 tablet (1,000 mg total) by mouth 2 (two) times daily with a meal. Yearly physical w/labs due in Nov must see MD for refills 180 tablet 1  . penicillin v potassium (VEETID) 500 MG tablet TAKE ONE TABLET BY MOUTH FOUR TIMES DAILY  120 tablet 0  . PRODIGY TWIST TOP LANCETS 28G MISC Use one two times daily. Dx: 250.00 100 each 3  . promethazine (PHENERGAN) 25 MG tablet Take 1 tablet (25 mg total) by mouth every 8 (eight) hours as needed for nausea or vomiting. 40 tablet 0  . triamterene-hydrochlorothiazide (MAXZIDE-25) 37.5-25 MG tablet TAKE ONE TABLET BY MOUTH ONCE DAILY 90 tablet 3   No facility-administered medications prior to visit.     ROS Review of Systems  Constitutional: Positive for unexpected weight change. Negative for appetite change and fatigue.  HENT: Negative for congestion, nosebleeds, sneezing, sore throat and trouble swallowing.   Eyes: Negative for itching and visual disturbance.  Respiratory: Negative for cough.   Cardiovascular: Negative for chest pain, palpitations and leg swelling.  Gastrointestinal: Negative for abdominal distention, blood in stool, diarrhea and nausea.  Genitourinary: Negative for frequency and hematuria.  Musculoskeletal: Positive for back pain. Negative for gait problem, joint swelling and neck pain.  Skin: Negative for rash.  Neurological: Negative for dizziness, tremors, speech difficulty and weakness.  Psychiatric/Behavioral: Negative for agitation, dysphoric mood and sleep disturbance. The patient is not nervous/anxious.     Objective:  BP 98/70   Pulse 97   Wt 206 lb (93.4 kg)   SpO2 98%   BMI 31.79 kg/m   BP Readings from  Last 3 Encounters:  07/21/16 98/70  04/14/16 110/70  07/09/15 118/70    Wt Readings from Last 3 Encounters:  07/21/16 206 lb (93.4 kg)  04/14/16 204 lb (92.5 kg)  07/09/15 207 lb (93.9 kg)    Physical Exam  Constitutional: He is oriented to person, place, and time. He appears well-developed. No distress.  NAD  HENT:  Mouth/Throat: Oropharynx is clear and moist.  Eyes: Conjunctivae are normal. Pupils are equal, round, and reactive to light.  Neck: Normal range of motion. No JVD present. No thyromegaly present.  Cardiovascular:  Normal rate, regular rhythm, normal heart sounds and intact distal pulses.  Exam reveals no gallop and no friction rub.   No murmur heard. Pulmonary/Chest: Effort normal and breath sounds normal. No respiratory distress. He has no wheezes. He has no rales. He exhibits no tenderness.  Abdominal: Soft. Bowel sounds are normal. He exhibits no distension and no mass. There is no tenderness. There is no rebound and no guarding.  Musculoskeletal: Normal range of motion. He exhibits tenderness. He exhibits no edema.  Lymphadenopathy:    He has no cervical adenopathy.  Neurological: He is alert and oriented to person, place, and time. He has normal reflexes. No cranial nerve deficit. He exhibits normal muscle tone. He displays a negative Romberg sign. Coordination and gait normal.  Skin: Skin is warm and dry. No rash noted.  Psychiatric: He has a normal mood and affect. His behavior is normal. Judgment and thought content normal.  obese  Lab Results  Component Value Date   WBC 9.4 08/04/2011   HGB 15.6 08/04/2011   HCT 46.0 08/04/2011   PLT 239.0 08/04/2011   GLUCOSE 174 (H) 04/09/2015   CHOL 156 01/02/2014   TRIG 265.0 (H) 01/02/2014   HDL 44.40 01/02/2014   LDLDIRECT 71.5 06/29/2010   LDLCALC 59 01/02/2014   ALT 16 08/28/2014   AST 16 08/28/2014   NA 139 04/09/2015   K 4.1 04/09/2015   CL 103 04/09/2015   CREATININE 1.12 04/09/2015   BUN 17 04/09/2015   CO2 27 04/09/2015   TSH 1.01 04/09/2015   PSA 1.71 10/28/2010   HGBA1C 8.7 (H) 07/09/2015    Dg Chest 2 View  Result Date: 09/22/2010 *RADIOLOGY REPORT* Clinical Data: Shoulder spasm was obtained. CHEST - 2 VIEW Comparison: 08/05/2009 Findings: Normal heart size.  Linear scar at the left base.  Right lung is clear.  No pneumothorax or pleural effusion.  No acute bony deformity. IMPRESSION: No active cardiopulmonary disease. Original Report Authenticated By: Jamas Lav, M.D.  Dg Shoulder Left  Result Date: 09/22/2010 *RADIOLOGY  REPORT* Clinical Data: Shoulder spasm was and pain LEFT SHOULDER - 2+ VIEW Comparison: None. Findings: No acute fracture.  No dislocation.  Unremarkable soft tissues. IMPRESSION: No acute bony pathology. Original Report Authenticated By: Jamas Lav, M.D.   Assessment & Plan:   There are no diagnoses linked to this encounter. I am having Mr. Ayer maintain his cholecalciferol, CINNAMON PO, Chromium, buPROPion, cyanocobalamin, Prodigy Autocode Blood Glucose, glucose blood, PRODIGY TWIST TOP LANCETS 28G, PRODIGY CONTROL SOLUTION, B-D SINGLE USE SWABS BUTTERFLY, aspirin EC, promethazine, diphenoxylate-atropine, glipiZIDE, metFORMIN, HYDROcodone-acetaminophen, penicillin v potassium, triamterene-hydrochlorothiazide, and KLOR-CON M20.  No orders of the defined types were placed in this encounter.    Follow-up: No Follow-up on file.  Walker Kehr, MD

## 2016-07-21 NOTE — Assessment & Plan Note (Signed)
Metformin, Glipizide Labs

## 2016-07-21 NOTE — Assessment & Plan Note (Signed)
On B12 

## 2016-07-21 NOTE — Assessment & Plan Note (Signed)
Triamt/HTZ

## 2016-07-21 NOTE — Progress Notes (Signed)
Pre visit review using our clinic review tool, if applicable. No additional management support is needed unless otherwise documented below in the visit note. 

## 2016-07-21 NOTE — Assessment & Plan Note (Signed)
Norco prn  Potential benefits of a long term opioids use as well as potential risks (i.e. addiction risk, apnea etc) and complications (i.e. Somnolence, constipation and others) were explained to the patient and were aknowledged. 

## 2016-09-05 ENCOUNTER — Ambulatory Visit: Payer: Commercial Managed Care - HMO | Admitting: Internal Medicine

## 2016-09-19 ENCOUNTER — Other Ambulatory Visit: Payer: Self-pay | Admitting: Internal Medicine

## 2016-10-01 ENCOUNTER — Other Ambulatory Visit: Payer: Self-pay | Admitting: Internal Medicine

## 2016-10-02 NOTE — Telephone Encounter (Signed)
Patient called in stating he is out of med.  Please send as soon as possible.

## 2016-10-27 ENCOUNTER — Ambulatory Visit: Payer: Commercial Managed Care - HMO | Admitting: Internal Medicine

## 2016-11-20 ENCOUNTER — Ambulatory Visit: Payer: Commercial Managed Care - HMO | Admitting: Internal Medicine

## 2016-11-24 ENCOUNTER — Other Ambulatory Visit (INDEPENDENT_AMBULATORY_CARE_PROVIDER_SITE_OTHER): Payer: Medicare HMO

## 2016-11-24 ENCOUNTER — Encounter: Payer: Self-pay | Admitting: Internal Medicine

## 2016-11-24 ENCOUNTER — Ambulatory Visit (INDEPENDENT_AMBULATORY_CARE_PROVIDER_SITE_OTHER): Payer: Medicare HMO | Admitting: Internal Medicine

## 2016-11-24 DIAGNOSIS — N320 Bladder-neck obstruction: Secondary | ICD-10-CM

## 2016-11-24 DIAGNOSIS — I10 Essential (primary) hypertension: Secondary | ICD-10-CM

## 2016-11-24 DIAGNOSIS — E538 Deficiency of other specified B group vitamins: Secondary | ICD-10-CM | POA: Diagnosis not present

## 2016-11-24 DIAGNOSIS — E6609 Other obesity due to excess calories: Secondary | ICD-10-CM | POA: Diagnosis not present

## 2016-11-24 DIAGNOSIS — M5440 Lumbago with sciatica, unspecified side: Secondary | ICD-10-CM

## 2016-11-24 DIAGNOSIS — E785 Hyperlipidemia, unspecified: Secondary | ICD-10-CM | POA: Diagnosis not present

## 2016-11-24 DIAGNOSIS — G8929 Other chronic pain: Secondary | ICD-10-CM

## 2016-11-24 DIAGNOSIS — E1140 Type 2 diabetes mellitus with diabetic neuropathy, unspecified: Secondary | ICD-10-CM

## 2016-11-24 DIAGNOSIS — Z6831 Body mass index (BMI) 31.0-31.9, adult: Secondary | ICD-10-CM

## 2016-11-24 DIAGNOSIS — IMO0002 Reserved for concepts with insufficient information to code with codable children: Secondary | ICD-10-CM

## 2016-11-24 DIAGNOSIS — K029 Dental caries, unspecified: Secondary | ICD-10-CM

## 2016-11-24 DIAGNOSIS — E1165 Type 2 diabetes mellitus with hyperglycemia: Secondary | ICD-10-CM

## 2016-11-24 LAB — HEPATIC FUNCTION PANEL
ALBUMIN: 4.1 g/dL (ref 3.5–5.2)
ALT: 14 U/L (ref 0–53)
AST: 11 U/L (ref 0–37)
Alkaline Phosphatase: 52 U/L (ref 39–117)
Bilirubin, Direct: 0.1 mg/dL (ref 0.0–0.3)
TOTAL PROTEIN: 6.6 g/dL (ref 6.0–8.3)
Total Bilirubin: 0.4 mg/dL (ref 0.2–1.2)

## 2016-11-24 LAB — BASIC METABOLIC PANEL
BUN: 10 mg/dL (ref 6–23)
CHLORIDE: 105 meq/L (ref 96–112)
CO2: 31 mEq/L (ref 19–32)
CREATININE: 1.1 mg/dL (ref 0.40–1.50)
Calcium: 9.8 mg/dL (ref 8.4–10.5)
GFR: 87.63 mL/min (ref >60.00)
GLUCOSE: 189 mg/dL — AB (ref 70–99)
Potassium: 3.9 mEq/L (ref 3.5–5.1)
Sodium: 143 mEq/L (ref 135–145)

## 2016-11-24 LAB — URINALYSIS
BILIRUBIN URINE: NEGATIVE
KETONES UR: NEGATIVE
Leukocytes, UA: NEGATIVE
NITRITE: NEGATIVE
Specific Gravity, Urine: 1.025 (ref 1.000–1.030)
TOTAL PROTEIN, URINE-UPE24: NEGATIVE
Urine Glucose: 500 — AB
Urobilinogen, UA: 0.2 (ref 0.0–1.0)
pH: 5.5 (ref 5.0–8.0)

## 2016-11-24 LAB — MICROALBUMIN / CREATININE URINE RATIO
Creatinine,U: 163.8 mg/dL
Microalb Creat Ratio: 0.4 mg/g (ref 0.0–30.0)
Microalb, Ur: 0.7 mg/dL (ref 0.0–1.9)

## 2016-11-24 LAB — TSH: TSH: 1.45 u[IU]/mL (ref 0.35–4.50)

## 2016-11-24 LAB — HEMOGLOBIN A1C: Hgb A1c MFr Bld: 8.8 % — ABNORMAL HIGH (ref 4.6–6.5)

## 2016-11-24 LAB — PSA: PSA: 5.71 ng/mL — ABNORMAL HIGH (ref 0.10–4.00)

## 2016-11-24 MED ORDER — PENICILLIN V POTASSIUM 500 MG PO TABS: ORAL_TABLET | ORAL | 0 refills | Status: DC

## 2016-11-24 MED ORDER — CYANOCOBALAMIN 1000 MCG/ML IJ SOLN
1000.0000 ug | Freq: Once | INTRAMUSCULAR | Status: AC
  Administered 2016-11-24: 1000 ug via INTRAMUSCULAR

## 2016-11-24 NOTE — Assessment & Plan Note (Signed)
Metformin Glipizide 

## 2016-11-24 NOTE — Assessment & Plan Note (Signed)
Triamt/HCT 

## 2016-11-24 NOTE — Assessment & Plan Note (Signed)
Discussed dental care

## 2016-11-24 NOTE — Assessment & Plan Note (Signed)
Norco prn  Potential benefits of a long term prn opioids use as well as potential risks (i.e. addiction risk, apnea etc) and complications (i.e. Somnolence, constipation and others) were explained to the patient and were aknowledged.  

## 2016-11-24 NOTE — Assessment & Plan Note (Signed)
Wt Readings from Last 3 Encounters:  11/24/16 210 lb (95.3 kg)  07/21/16 206 lb (93.4 kg)  04/14/16 204 lb (92.5 kg)

## 2016-11-24 NOTE — Assessment & Plan Note (Signed)
On B12 

## 2016-11-24 NOTE — Progress Notes (Signed)
Subjective:  Patient ID: Timothy Schmidt, male    DOB: 02/24/1956  Age: 61 y.o. MRN: [id number]  CC: Follow-up and Diabetes   HPI Timothy Schmidt presents for LBP, HTN, anxiety f/u  Outpatient Medications Prior to Visit  Medication Sig Dispense Refill  . Alcohol Swabs (B-D SINGLE USE SWABS BUTTERFLY) PADS Use one two times daily.Dx: 250.00 100 each 3  . aspirin EC 81 MG tablet Take 81 mg by mouth daily.    . Blood Glucose Calibration (PRODIGY CONTROL SOLUTION) LOW SOLN Use as directed per meter instructions. 3 each 0  . Blood Glucose Monitoring Suppl (PRODIGY AUTOCODE BLOOD GLUCOSE) W/DEVICE KIT Use two times daily as directed. 1 each 0  . buPROPion (WELLBUTRIN SR) 150 MG 12 hr tablet Take 1 tablet (150 mg total) by mouth daily. 90 tablet 3  . cholecalciferol (VITAMIN D) 1000 UNITS tablet Take 1 tablet (1,000 Units total) by mouth daily. 90 tablet 3  . Chromium 200 MCG TABS Take 2 tablets by mouth daily.    Marland Kitchen CINNAMON PO Take 1 tablet by mouth daily.    . cyanocobalamin 1000 MCG tablet Take 0.5 tablets (500 mcg total) by mouth daily. 90 tablet 3  . diphenoxylate-atropine (LOMOTIL) 2.5-0.025 MG per tablet Take 1 tablet by mouth 4 (four) times daily as needed for diarrhea or loose stools. 60 tablet 0  . glipiZIDE (GLUCOTROL) 5 MG tablet TAKE ONE TABLET BY MOUTH TWICE DAILY BEFORE MEAL(S) 180 tablet 1  . glucose blood (PRODIGY AUTOCODE TEST) test strip Use one two times daily. Dx: 250.00 100 each 3  . HYDROcodone-acetaminophen (NORCO/VICODIN) 5-325 MG tablet Take 1 tablet by mouth 2 (two) times daily as needed for severe pain. 60 tablet 0  . KLOR-CON M20 20 MEQ tablet TAKE ONE TABLET BY MOUTH ONCE DAILY 30 tablet 8  . metFORMIN (GLUCOPHAGE) 1000 MG tablet Take 1 tablet (1,000 mg total) by mouth 2 (two) times daily with a meal. 180 tablet 1  . penicillin v potassium (VEETID) 500 MG tablet TAKE ONE TABLET BY MOUTH FOUR TIMES DAILY 120 tablet 0  . PRODIGY TWIST TOP LANCETS 28G MISC Use one  two times daily. Dx: 250.00 100 each 3  . promethazine (PHENERGAN) 25 MG tablet Take 1 tablet (25 mg total) by mouth every 8 (eight) hours as needed for nausea or vomiting. 40 tablet 0  . triamterene-hydrochlorothiazide (MAXZIDE-25) 37.5-25 MG tablet TAKE ONE TABLET BY MOUTH ONCE DAILY 90 tablet 3   No facility-administered medications prior to visit.     ROS Review of Systems  Constitutional: Negative for appetite change, fatigue and unexpected weight change.  HENT: Negative for congestion, nosebleeds, sneezing, sore throat and trouble swallowing.   Eyes: Negative for itching and visual disturbance.  Respiratory: Negative for cough.   Cardiovascular: Negative for chest pain, palpitations and leg swelling.  Gastrointestinal: Negative for abdominal distention, blood in stool, diarrhea and nausea.  Genitourinary: Negative for frequency and hematuria.  Musculoskeletal: Positive for back pain. Negative for gait problem, joint swelling and neck pain.  Skin: Negative for rash.  Neurological: Negative for dizziness, tremors, speech difficulty and weakness.  Psychiatric/Behavioral: Negative for agitation, dysphoric mood, sleep disturbance and suicidal ideas. The patient is not nervous/anxious.     Objective:  BP 116/88   Pulse 94   Temp 98.6 F (37 C)   Wt 210 lb (95.3 kg)   SpO2 94%   BMI 32.41 kg/m   BP Readings from Last 3 Encounters:  11/24/16 116/88  07/21/16 98/70  04/14/16 110/70    Wt Readings from Last 3 Encounters:  11/24/16 210 lb (95.3 kg)  07/21/16 206 lb (93.4 kg)  04/14/16 204 lb (92.5 kg)    Physical Exam  Constitutional: He is oriented to person, place, and time. He appears well-developed. No distress.  NAD  HENT:  Mouth/Throat: Oropharynx is clear and moist.  Eyes: Conjunctivae are normal. Pupils are equal, round, and reactive to light.  Neck: Normal range of motion. No JVD present. No thyromegaly present.  Cardiovascular: Normal rate, regular rhythm,  normal heart sounds and intact distal pulses.  Exam reveals no gallop and no friction rub.   No murmur heard. Pulmonary/Chest: Effort normal and breath sounds normal. No respiratory distress. He has no wheezes. He has no rales. He exhibits no tenderness.  Abdominal: Soft. Bowel sounds are normal. He exhibits no distension and no mass. There is no tenderness. There is no rebound and no guarding.  Musculoskeletal: Normal range of motion. He exhibits tenderness. He exhibits no edema.  Lymphadenopathy:    He has no cervical adenopathy.  Neurological: He is alert and oriented to person, place, and time. He has normal reflexes. No cranial nerve deficit. He exhibits normal muscle tone. He displays a negative Romberg sign. Coordination and gait normal.  Skin: Skin is warm and dry. No rash noted.  Psychiatric: He has a normal mood and affect. His behavior is normal. Judgment and thought content normal.  Obese Talkative  Lab Results  Component Value Date   WBC 9.4 08/04/2011   HGB 15.6 08/04/2011   HCT 46.0 08/04/2011   PLT 239.0 08/04/2011   GLUCOSE 174 (H) 04/09/2015   CHOL 156 01/02/2014   TRIG 265.0 (H) 01/02/2014   HDL 44.40 01/02/2014   LDLDIRECT 71.5 06/29/2010   LDLCALC 59 01/02/2014   ALT 16 08/28/2014   AST 16 08/28/2014   NA 139 04/09/2015   K 4.1 04/09/2015   CL 103 04/09/2015   CREATININE 1.12 04/09/2015   BUN 17 04/09/2015   CO2 27 04/09/2015   TSH 1.01 04/09/2015   PSA 1.71 10/28/2010   HGBA1C 8.7 (H) 07/09/2015    Dg Chest 2 View  Result Date: 09/22/2010 *RADIOLOGY REPORT* Clinical Data: Shoulder spasm was obtained. CHEST - 2 VIEW Comparison: 08/05/2009 Findings: Normal heart size.  Linear scar at the left base.  Right lung is clear.  No pneumothorax or pleural effusion.  No acute bony deformity. IMPRESSION: No active cardiopulmonary disease. Original Report Authenticated By: Jamas Lav, M.D.  Dg Shoulder Left  Result Date: 09/22/2010 *RADIOLOGY REPORT* Clinical  Data: Shoulder spasm was and pain LEFT SHOULDER - 2+ VIEW Comparison: None. Findings: No acute fracture.  No dislocation.  Unremarkable soft tissues. IMPRESSION: No acute bony pathology. Original Report Authenticated By: Jamas Lav, M.D.   Assessment & Plan:   There are no diagnoses linked to this encounter. I am having Mr. Dismuke maintain his cholecalciferol, CINNAMON PO, Chromium, buPROPion, cyanocobalamin, Prodigy Autocode Blood Glucose, glucose blood, PRODIGY TWIST TOP LANCETS 28G, PRODIGY CONTROL SOLUTION, B-D SINGLE USE SWABS BUTTERFLY, aspirin EC, promethazine, diphenoxylate-atropine, penicillin v potassium, triamterene-hydrochlorothiazide, KLOR-CON M20, HYDROcodone-acetaminophen, glipiZIDE, and metFORMIN.  No orders of the defined types were placed in this encounter.    Follow-up: No Follow-up on file.  Walker Kehr, MD

## 2016-11-27 ENCOUNTER — Other Ambulatory Visit: Payer: Self-pay | Admitting: Internal Medicine

## 2016-11-27 DIAGNOSIS — R972 Elevated prostate specific antigen [PSA]: Secondary | ICD-10-CM

## 2016-11-27 MED ORDER — GLIPIZIDE 5 MG PO TABS: ORAL_TABLET | ORAL | 11 refills | Status: DC

## 2017-01-03 ENCOUNTER — Encounter: Payer: Self-pay | Admitting: Internal Medicine

## 2017-02-02 ENCOUNTER — Ambulatory Visit: Payer: Medicare HMO

## 2017-02-05 ENCOUNTER — Other Ambulatory Visit: Payer: Self-pay | Admitting: Internal Medicine

## 2017-02-23 ENCOUNTER — Ambulatory Visit: Payer: Medicare HMO | Admitting: Internal Medicine

## 2017-03-14 ENCOUNTER — Ambulatory Visit: Payer: Medicare HMO

## 2017-03-19 ENCOUNTER — Ambulatory Visit: Payer: Medicare HMO | Admitting: Internal Medicine

## 2017-03-21 ENCOUNTER — Other Ambulatory Visit: Payer: Self-pay | Admitting: Internal Medicine

## 2017-03-30 ENCOUNTER — Ambulatory Visit (INDEPENDENT_AMBULATORY_CARE_PROVIDER_SITE_OTHER): Payer: Medicare HMO | Admitting: Internal Medicine

## 2017-03-30 ENCOUNTER — Encounter: Payer: Self-pay | Admitting: Internal Medicine

## 2017-03-30 ENCOUNTER — Other Ambulatory Visit (INDEPENDENT_AMBULATORY_CARE_PROVIDER_SITE_OTHER): Payer: Medicare HMO

## 2017-03-30 ENCOUNTER — Other Ambulatory Visit: Payer: Self-pay | Admitting: Internal Medicine

## 2017-03-30 DIAGNOSIS — E1140 Type 2 diabetes mellitus with diabetic neuropathy, unspecified: Secondary | ICD-10-CM | POA: Diagnosis not present

## 2017-03-30 DIAGNOSIS — E6609 Other obesity due to excess calories: Secondary | ICD-10-CM | POA: Diagnosis not present

## 2017-03-30 DIAGNOSIS — E66811 Obesity, class 1: Secondary | ICD-10-CM

## 2017-03-30 DIAGNOSIS — I10 Essential (primary) hypertension: Secondary | ICD-10-CM

## 2017-03-30 DIAGNOSIS — E1165 Type 2 diabetes mellitus with hyperglycemia: Secondary | ICD-10-CM | POA: Diagnosis not present

## 2017-03-30 DIAGNOSIS — R972 Elevated prostate specific antigen [PSA]: Secondary | ICD-10-CM | POA: Insufficient documentation

## 2017-03-30 DIAGNOSIS — E538 Deficiency of other specified B group vitamins: Secondary | ICD-10-CM

## 2017-03-30 DIAGNOSIS — IMO0002 Reserved for concepts with insufficient information to code with codable children: Secondary | ICD-10-CM

## 2017-03-30 DIAGNOSIS — Z6831 Body mass index (BMI) 31.0-31.9, adult: Secondary | ICD-10-CM | POA: Diagnosis not present

## 2017-03-30 DIAGNOSIS — K029 Dental caries, unspecified: Secondary | ICD-10-CM | POA: Diagnosis not present

## 2017-03-30 LAB — HEMOGLOBIN A1C: Hgb A1c MFr Bld: 8.5 % — ABNORMAL HIGH (ref 4.6–6.5)

## 2017-03-30 LAB — BASIC METABOLIC PANEL
BUN: 13 mg/dL (ref 6–23)
CO2: 28 mEq/L (ref 19–32)
Calcium: 9.8 mg/dL (ref 8.4–10.5)
Chloride: 107 mEq/L (ref 96–112)
Creatinine, Ser: 1.18 mg/dL (ref 0.40–1.50)
GFR: 80.72 mL/min (ref >60.00)
Glucose, Bld: 97 mg/dL (ref 70–99)
POTASSIUM: 3.6 meq/L (ref 3.5–5.1)
SODIUM: 143 meq/L (ref 135–145)

## 2017-03-30 MED ORDER — PENICILLIN V POTASSIUM 500 MG PO TABS: ORAL_TABLET | ORAL | 0 refills | Status: DC

## 2017-03-30 NOTE — Assessment & Plan Note (Signed)
[facility] appt pending

## 2017-03-30 NOTE — Assessment & Plan Note (Signed)
Wt Readings from Last 3 Encounters:  03/30/17 198 lb (89.8 kg)  11/24/16 210 lb (95.3 kg)  07/21/16 206 lb (93.4 kg)

## 2017-03-30 NOTE — Assessment & Plan Note (Signed)
Pt cancelled appt w/Dr Vernie Ammonsttelin. [facility]

## 2017-03-30 NOTE — Assessment & Plan Note (Signed)
Metformin Glipizide Risks associated with treatment and diet noncompliance were discussed. Compliance was encouraged. 

## 2017-03-30 NOTE — Assessment & Plan Note (Signed)
Triamt/HCT 

## 2017-03-30 NOTE — Assessment & Plan Note (Signed)
On B12 

## 2017-03-30 NOTE — Progress Notes (Signed)
Subjective:  Patient ID: Timothy Schmidt, male    DOB: Aug 19, 1956  Age: 61 y.o. MRN: [id number]  CC: No chief complaint on file.   HPI Timothy Schmidt presents for DM, HTN, LBP f/u  Outpatient Medications Prior to Visit  Medication Sig Dispense Refill  . Alcohol Swabs (B-D SINGLE USE SWABS BUTTERFLY) PADS Use one two times daily.Dx: 250.00 100 each 3  . aspirin EC 81 MG tablet Take 81 mg by mouth daily.    . Blood Glucose Calibration (PRODIGY CONTROL SOLUTION) LOW SOLN Use as directed per meter instructions. 3 each 0  . Blood Glucose Monitoring Suppl (PRODIGY AUTOCODE BLOOD GLUCOSE) W/DEVICE KIT Use two times daily as directed. 1 each 0  . buPROPion (WELLBUTRIN SR) 150 MG 12 hr tablet Take 1 tablet (150 mg total) by mouth daily. 90 tablet 3  . cholecalciferol (VITAMIN D) 1000 UNITS tablet Take 1 tablet (1,000 Units total) by mouth daily. 90 tablet 3  . Chromium 200 MCG TABS Take 2 tablets by mouth daily.    Marland Kitchen CINNAMON PO Take 1 tablet by mouth daily.    . cyanocobalamin 1000 MCG tablet Take 0.5 tablets (500 mcg total) by mouth daily. 90 tablet 3  . diphenoxylate-atropine (LOMOTIL) 2.5-0.025 MG per tablet Take 1 tablet by mouth 4 (four) times daily as needed for diarrhea or loose stools. 60 tablet 0  . glipiZIDE (GLUCOTROL) 5 MG tablet Take 10 mg in am and 5 mg in pm 90 tablet 11  . glipiZIDE (GLUCOTROL) 5 MG tablet TAKE ONE TABLET BY MOUTH TWICE DAILY BEFORE  MEALS 180 tablet 1  . glucose blood (PRODIGY AUTOCODE TEST) test strip Use one two times daily. Dx: 250.00 100 each 3  . HYDROcodone-acetaminophen (NORCO/VICODIN) 5-325 MG tablet Take 1 tablet by mouth 2 (two) times daily as needed for severe pain. 60 tablet 0  . KLOR-CON M20 20 MEQ tablet TAKE ONE TABLET BY MOUTH ONCE DAILY 90 tablet 1  . penicillin v potassium (VEETID) 500 MG tablet TAKE ONE TABLET BY MOUTH FOUR TIMES DAILY 120 tablet 0  . PRODIGY TWIST TOP LANCETS 28G MISC Use one two times daily. Dx: 250.00 100 each 3  .  promethazine (PHENERGAN) 25 MG tablet Take 1 tablet (25 mg total) by mouth every 8 (eight) hours as needed for nausea or vomiting. 40 tablet 0  . triamterene-hydrochlorothiazide (MAXZIDE-25) 37.5-25 MG tablet TAKE ONE TABLET BY MOUTH ONCE DAILY 90 tablet 3  . metFORMIN (GLUCOPHAGE) 1000 MG tablet Take 1 tablet (1,000 mg total) by mouth 2 (two) times daily with a meal. 180 tablet 1   No facility-administered medications prior to visit.     ROS Review of Systems  Constitutional: Negative for appetite change, fatigue and unexpected weight change.  HENT: Negative for congestion, nosebleeds, sneezing, sore throat and trouble swallowing.   Eyes: Negative for itching and visual disturbance.  Respiratory: Negative for cough.   Cardiovascular: Negative for chest pain, palpitations and leg swelling.  Gastrointestinal: Negative for abdominal distention, blood in stool, diarrhea and nausea.  Genitourinary: Negative for frequency and hematuria.  Musculoskeletal: Positive for back pain. Negative for gait problem, joint swelling and neck pain.  Skin: Negative for rash.  Neurological: Negative for dizziness, tremors, speech difficulty and weakness.  Psychiatric/Behavioral: Negative for agitation, dysphoric mood and sleep disturbance. The patient is not nervous/anxious.     Objective:  BP 124/78 (BP Location: Left Arm, Patient Position: Sitting, Cuff Size: Large)   Pulse (!) 101   Temp  99.2 F (37.3 C) (Oral)   Ht 5' 7.5" (1.715 m)   Wt 198 lb (89.8 kg)   SpO2 98%   BMI 30.55 kg/m   BP Readings from Last 3 Encounters:  03/30/17 124/78  11/24/16 116/88  07/21/16 98/70    Wt Readings from Last 3 Encounters:  03/30/17 198 lb (89.8 kg)  11/24/16 210 lb (95.3 kg)  07/21/16 206 lb (93.4 kg)    Physical Exam  Constitutional: He is oriented to person, place, and time. He appears well-developed. No distress.  NAD  HENT:  Mouth/Throat: Oropharynx is clear and moist.  Eyes: Pupils are equal,  round, and reactive to light. Conjunctivae are normal.  Neck: Normal range of motion. No JVD present. No thyromegaly present.  Cardiovascular: Normal rate, regular rhythm, normal heart sounds and intact distal pulses.  Exam reveals no gallop and no friction rub.   No murmur heard. Pulmonary/Chest: Effort normal and breath sounds normal. No respiratory distress. He has no wheezes. He has no rales. He exhibits no tenderness.  Abdominal: Soft. Bowel sounds are normal. He exhibits no distension and no mass. There is no tenderness. There is no rebound and no guarding.  Musculoskeletal: Normal range of motion. He exhibits tenderness. He exhibits no edema.  Lymphadenopathy:    He has no cervical adenopathy.  Neurological: He is alert and oriented to person, place, and time. He has normal reflexes. No cranial nerve deficit. He exhibits normal muscle tone. He displays a negative Romberg sign. Coordination and gait normal.  Skin: Skin is warm and dry. No rash noted.  Psychiatric: He has a normal mood and affect. His behavior is normal. Judgment and thought content normal.  Obese, talkative LS tender Caries  Lab Results  Component Value Date   WBC 9.4 08/04/2011   HGB 15.6 08/04/2011   HCT 46.0 08/04/2011   PLT 239.0 08/04/2011   GLUCOSE 189 (H) 11/24/2016   CHOL 156 01/02/2014   TRIG 265.0 (H) 01/02/2014   HDL 44.40 01/02/2014   LDLDIRECT 71.5 06/29/2010   LDLCALC 59 01/02/2014   ALT 14 11/24/2016   AST 11 11/24/2016   NA 143 11/24/2016   K 3.9 11/24/2016   CL 105 11/24/2016   CREATININE 1.10 11/24/2016   BUN 10 11/24/2016   CO2 31 11/24/2016   TSH 1.45 11/24/2016   PSA 5.71 (H) 11/24/2016   HGBA1C 8.8 (H) 11/24/2016   MICROALBUR 0.7 11/24/2016    Dg Chest 2 View  Result Date: 09/22/2010 *RADIOLOGY REPORT* Clinical Data: Shoulder spasm was obtained. CHEST - 2 VIEW Comparison: 08/05/2009 Findings: Normal heart size.  Linear scar at the left base.  Right lung is clear.  No pneumothorax  or pleural effusion.  No acute bony deformity. IMPRESSION: No active cardiopulmonary disease. Original Report Authenticated By: Jamas Lav, M.D.  Dg Shoulder Left  Result Date: 09/22/2010 *RADIOLOGY REPORT* Clinical Data: Shoulder spasm was and pain LEFT SHOULDER - 2+ VIEW Comparison: None. Findings: No acute fracture.  No dislocation.  Unremarkable soft tissues. IMPRESSION: No acute bony pathology. Original Report Authenticated By: Jamas Lav, M.D.   Assessment & Plan:   There are no diagnoses linked to this encounter. I am having Mr. Harig maintain his cholecalciferol, CINNAMON PO, Chromium, buPROPion, cyanocobalamin, Prodigy Autocode Blood Glucose, glucose blood, PRODIGY TWIST TOP LANCETS 28G, PRODIGY CONTROL SOLUTION, B-D SINGLE USE SWABS BUTTERFLY, aspirin EC, promethazine, diphenoxylate-atropine, triamterene-hydrochlorothiazide, HYDROcodone-acetaminophen, penicillin v potassium, glipiZIDE, KLOR-CON M20, and glipiZIDE.  No orders of the defined types were placed  in this encounter.    Follow-up: No Follow-up on file.  Walker Kehr, MD

## 2017-04-09 ENCOUNTER — Telehealth: Payer: Self-pay | Admitting: Internal Medicine

## 2017-04-09 NOTE — Telephone Encounter (Signed)
Pt returned your called regarding lab work. I gave him MD response. He expressed understanding and did not have any questions at this time.

## 2017-04-30 ENCOUNTER — Telehealth: Payer: Self-pay | Admitting: Internal Medicine

## 2017-04-30 MED ORDER — PENICILLIN V POTASSIUM 500 MG PO TABS: ORAL_TABLET | ORAL | 0 refills | Status: DC

## 2017-04-30 NOTE — Telephone Encounter (Signed)
Ok both Thx 

## 2017-04-30 NOTE — Telephone Encounter (Signed)
Updated pharmacy and resent Penicillin script. Check [place] [facility] last filled for Hydrocodone 11/24/2016. pls advise if ok to refill...[name]/lmb

## 2017-04-30 NOTE — Telephone Encounter (Signed)
HYDROcodone-acetaminophen (NORCO/VICODIN) 5-325 MG tablet   Patient is requesting a refill on this medication.   Also, patients penicillin v potassium (VEETID) 500 MG tablet  Was sent to the wrong pharmacy. Can it please be sent over to :    [facility] [place], [place] - [id number] [phone] (Phone) [phone] (Fax)

## 2017-05-01 MED ORDER — HYDROCODONE-ACETAMINOPHEN 5-325 MG PO TABS: 1.0000 | ORAL_TABLET | Freq: Two times a day (BID) | ORAL | 0 refills | Status: DC | PRN

## 2017-05-01 NOTE — Telephone Encounter (Signed)
Called pt no answer LMOM rx ready for pick-up.../lmb 

## 2017-05-11 ENCOUNTER — Ambulatory Visit: Payer: Medicare HMO | Admitting: Internal Medicine

## 2017-05-24 DIAGNOSIS — R1030 Lower abdominal pain, unspecified: Secondary | ICD-10-CM | POA: Diagnosis not present

## 2017-05-24 DIAGNOSIS — R1032 Left lower quadrant pain: Secondary | ICD-10-CM | POA: Diagnosis not present

## 2017-05-24 DIAGNOSIS — R972 Elevated prostate specific antigen [PSA]: Secondary | ICD-10-CM | POA: Diagnosis not present

## 2017-06-29 ENCOUNTER — Telehealth: Payer: Self-pay | Admitting: Internal Medicine

## 2017-06-29 NOTE — Telephone Encounter (Signed)
HYDROcodone-acetaminophen (NORCO/VICODIN) 5-325 MG tablet   Patient is requesting a refill. Please advise.

## 2017-07-03 MED ORDER — HYDROCODONE-ACETAMINOPHEN 5-325 MG PO TABS: 1.0000 | ORAL_TABLET | Freq: Two times a day (BID) | ORAL | 0 refills | Status: DC | PRN

## 2017-07-03 NOTE — Telephone Encounter (Signed)
OK to fill this/these prescription(s) with additional refills x0 Needs to have an OV every 3 months Thank you!  

## 2017-07-03 NOTE — Telephone Encounter (Signed)
Pt has appt 11/16 

## 2017-07-04 NOTE — Telephone Encounter (Signed)
Called pt no answer LMOM rx ready for pick-up.../lmb 

## 2017-07-05 DIAGNOSIS — R1032 Left lower quadrant pain: Secondary | ICD-10-CM | POA: Insufficient documentation

## 2017-07-05 DIAGNOSIS — Z87898 Personal history of other specified conditions: Secondary | ICD-10-CM | POA: Diagnosis not present

## 2017-07-05 DIAGNOSIS — R972 Elevated prostate specific antigen [PSA]: Secondary | ICD-10-CM | POA: Diagnosis not present

## 2017-07-06 ENCOUNTER — Other Ambulatory Visit (INDEPENDENT_AMBULATORY_CARE_PROVIDER_SITE_OTHER): Payer: Medicare HMO

## 2017-07-06 ENCOUNTER — Ambulatory Visit: Payer: Medicare HMO | Admitting: Internal Medicine

## 2017-07-06 ENCOUNTER — Encounter: Payer: Self-pay | Admitting: Internal Medicine

## 2017-07-06 DIAGNOSIS — I10 Essential (primary) hypertension: Secondary | ICD-10-CM

## 2017-07-06 DIAGNOSIS — G8929 Other chronic pain: Secondary | ICD-10-CM

## 2017-07-06 DIAGNOSIS — E1165 Type 2 diabetes mellitus with hyperglycemia: Secondary | ICD-10-CM

## 2017-07-06 DIAGNOSIS — E538 Deficiency of other specified B group vitamins: Secondary | ICD-10-CM | POA: Diagnosis not present

## 2017-07-06 DIAGNOSIS — M5440 Lumbago with sciatica, unspecified side: Secondary | ICD-10-CM | POA: Diagnosis not present

## 2017-07-06 DIAGNOSIS — R972 Elevated prostate specific antigen [PSA]: Secondary | ICD-10-CM

## 2017-07-06 DIAGNOSIS — E1140 Type 2 diabetes mellitus with diabetic neuropathy, unspecified: Secondary | ICD-10-CM

## 2017-07-06 DIAGNOSIS — IMO0002 Reserved for concepts with insufficient information to code with codable children: Secondary | ICD-10-CM

## 2017-07-06 LAB — HEMOGLOBIN A1C: HEMOGLOBIN A1C: 9.1 % — AB (ref 4.6–6.5)

## 2017-07-06 MED ORDER — HYDROCODONE-ACETAMINOPHEN 5-325 MG PO TABS: 1.0000 | ORAL_TABLET | Freq: Two times a day (BID) | ORAL | 0 refills | Status: DC | PRN

## 2017-07-06 NOTE — Assessment & Plan Note (Signed)
Norco prn  Potential benefits of a long term prn opioids use as well as potential risks (i.e. addiction risk, apnea etc) and complications (i.e. Somnolence, constipation and others) were explained to the patient and were aknowledged.  

## 2017-07-06 NOTE — Assessment & Plan Note (Signed)
On B12 

## 2017-07-06 NOTE — Assessment & Plan Note (Signed)
Triamt/HCT 

## 2017-07-06 NOTE — Assessment & Plan Note (Signed)
Metformin Glipizide Risks associated with treatment and diet noncompliance were discussed. Compliance was encouraged. 

## 2017-07-06 NOTE — Progress Notes (Signed)
Subjective:  Patient ID: Timothy Schmidt, male    DOB: [birth year]  Age: 61 y.o. MRN: [id number]  CC: No chief complaint on file.   HPI LESLEE HAUETER presents for DM, caries, BPH, LBP f/u  Outpatient Medications Prior to Visit  Medication Sig Dispense Refill  . Alcohol Swabs (B-D SINGLE USE SWABS BUTTERFLY) PADS Use one two times daily.Dx: 250.00 100 each 3  . aspirin EC 81 MG tablet Take 81 mg by mouth daily.    . Blood Glucose Calibration (PRODIGY CONTROL SOLUTION) LOW SOLN Use as directed per meter instructions. 3 each 0  . Blood Glucose Monitoring Suppl (PRODIGY AUTOCODE BLOOD GLUCOSE) W/DEVICE KIT Use two times daily as directed. 1 each 0  . cholecalciferol (VITAMIN D) 1000 UNITS tablet Take 1 tablet (1,000 Units total) by mouth daily. 90 tablet 3  . ciprofloxacin (CIPRO) 500 MG tablet Take 500 mg by mouth.    . cyanocobalamin 1000 MCG tablet Take 0.5 tablets (500 mcg total) by mouth daily. 90 tablet 3  . glipiZIDE (GLUCOTROL) 5 MG tablet TAKE ONE TABLET BY MOUTH TWICE DAILY BEFORE  MEALS 180 tablet 1  . glucose blood (PRODIGY AUTOCODE TEST) test strip Use one two times daily. Dx: 250.00 100 each 3  . HYDROcodone-acetaminophen (NORCO/VICODIN) 5-325 MG tablet Take 1 tablet 2 (two) times daily as needed by mouth for severe pain. 60 tablet 0  . KLOR-CON M20 20 MEQ tablet TAKE ONE TABLET BY MOUTH ONCE DAILY 90 tablet 1  . metFORMIN (GLUCOPHAGE) 1000 MG tablet TAKE 1 TABLET BY MOUTH TWICE DAILY WITH A MEAL 180 tablet 1  . penicillin v potassium (VEETID) 500 MG tablet TAKE ONE TABLET BY MOUTH FOUR TIMES DAILY 120 tablet 0  . PRODIGY TWIST TOP LANCETS 28G MISC Use one two times daily. Dx: 250.00 100 each 3  . triamterene-hydrochlorothiazide (MAXZIDE-25) 37.5-25 MG tablet TAKE ONE TABLET BY MOUTH ONCE DAILY 90 tablet 3   No facility-administered medications prior to visit.     ROS Review of Systems  Constitutional: Negative for appetite change, fatigue and unexpected weight change.    HENT: Positive for dental problem. Negative for congestion, nosebleeds, sneezing, sore throat and trouble swallowing.   Eyes: Negative for itching and visual disturbance.  Respiratory: Negative for cough.   Cardiovascular: Negative for chest pain, palpitations and leg swelling.  Gastrointestinal: Negative for abdominal distention, blood in stool, diarrhea and nausea.  Genitourinary: Negative for frequency and hematuria.  Musculoskeletal: Positive for back pain. Negative for gait problem, joint swelling and neck pain.  Skin: Negative for rash.  Neurological: Negative for dizziness, tremors, speech difficulty and weakness.  Psychiatric/Behavioral: Negative for agitation, dysphoric mood, sleep disturbance and suicidal ideas. The patient is not nervous/anxious.     Objective:  BP 124/76 (BP Location: Left Arm, Patient Position: Sitting, Cuff Size: Large)   Pulse (!) 109   Temp 99.7 F (37.6 C) (Oral)   Ht 5' 7.5" (1.715 m)   Wt 206 lb (93.4 kg)   SpO2 99%   BMI 31.79 kg/m   BP Readings from Last 3 Encounters:  07/06/17 124/76  03/30/17 124/78  11/24/16 116/88    Wt Readings from Last 3 Encounters:  07/06/17 206 lb (93.4 kg)  03/30/17 198 lb (89.8 kg)  11/24/16 210 lb (95.3 kg)    Physical Exam  Constitutional: He is oriented to person, place, and time. He appears well-developed. No distress.  NAD  HENT:  Mouth/Throat: Oropharynx is clear and moist.  Eyes: Conjunctivae  are normal. Pupils are equal, round, and reactive to light.  Neck: Normal range of motion. No JVD present. No thyromegaly present.  Cardiovascular: Normal rate, regular rhythm, normal heart sounds and intact distal pulses. Exam reveals no gallop and no friction rub.  No murmur heard. Pulmonary/Chest: Effort normal and breath sounds normal. No respiratory distress. He has no wheezes. He has no rales. He exhibits no tenderness.  Abdominal: Soft. Bowel sounds are normal. He exhibits no distension and no mass.  There is no tenderness. There is no rebound and no guarding.  Musculoskeletal: Normal range of motion. He exhibits tenderness. He exhibits no edema.  Lymphadenopathy:    He has no cervical adenopathy.  Neurological: He is alert and oriented to person, place, and time. He has normal reflexes. No cranial nerve deficit. He exhibits normal muscle tone. He displays a negative Romberg sign. Coordination and gait normal.  Skin: Skin is warm and dry. No rash noted.  Psychiatric: He has a normal mood and affect. His behavior is normal. Judgment and thought content normal.  Obese LS tender Caries  Lab Results  Component Value Date   WBC 9.4 08/04/2011   HGB 15.6 08/04/2011   HCT 46.0 08/04/2011   PLT 239.0 08/04/2011   GLUCOSE 97 03/30/2017   CHOL 156 01/02/2014   TRIG 265.0 (H) 01/02/2014   HDL 44.40 01/02/2014   LDLDIRECT 71.5 06/29/2010   LDLCALC 59 01/02/2014   ALT 14 11/24/2016   AST 11 11/24/2016   NA 143 03/30/2017   K 3.6 03/30/2017   CL 107 03/30/2017   CREATININE 1.18 03/30/2017   BUN 13 03/30/2017   CO2 28 03/30/2017   TSH 1.45 11/24/2016   PSA 5.71 (H) 11/24/2016   HGBA1C 8.5 (H) 03/30/2017   MICROALBUR 0.7 11/24/2016    Dg Chest 2 View  Result Date: 09/22/2010 *RADIOLOGY REPORT* Clinical Data: Shoulder spasm was obtained. CHEST - 2 VIEW Comparison: 08/05/2009 Findings: Normal heart size.  Linear scar at the left base.  Right lung is clear.  No pneumothorax or pleural effusion.  No acute bony deformity. IMPRESSION: No active cardiopulmonary disease. Original Report Authenticated By: Jamas Lav, M.D.  Dg Shoulder Left  Result Date: 09/22/2010 *RADIOLOGY REPORT* Clinical Data: Shoulder spasm was and pain LEFT SHOULDER - 2+ VIEW Comparison: None. Findings: No acute fracture.  No dislocation.  Unremarkable soft tissues. IMPRESSION: No acute bony pathology. Original Report Authenticated By: Jamas Lav, M.D.   Assessment & Plan:   There are no diagnoses linked to  this encounter. I am having Pope L. Zigmund Daniel maintain his cholecalciferol, cyanocobalamin, Prodigy Autocode Blood Glucose, glucose blood, PRODIGY TWIST TOP LANCETS 28G, PRODIGY CONTROL SOLUTION, B-D SINGLE USE SWABS BUTTERFLY, aspirin EC, triamterene-hydrochlorothiazide, KLOR-CON M20, glipiZIDE, metFORMIN, penicillin v potassium, HYDROcodone-acetaminophen, and ciprofloxacin.  Meds ordered this encounter  Medications  . ciprofloxacin (CIPRO) 500 MG tablet    Sig: Take 500 mg by mouth.     Follow-up: No Follow-up on file.  Walker Kehr, MD

## 2017-07-06 NOTE — Assessment & Plan Note (Signed)
Seeing Dr Logan BoresEvans - bx pending  Cipro per Dr Logan BoresEvans

## 2017-07-08 ENCOUNTER — Other Ambulatory Visit: Payer: Self-pay | Admitting: Internal Medicine

## 2017-07-08 MED ORDER — PIOGLITAZONE HCL 30 MG PO TABS: 30.0000 mg | ORAL_TABLET | Freq: Every day | ORAL | 3 refills | Status: DC

## 2017-07-08 NOTE — Progress Notes (Signed)
Kathie RhodesBetty, Please inform the patient that all labs are stable, except for a worsening DM control. Pls add Actos (rx emailed) Thanks, AP

## 2017-07-11 ENCOUNTER — Telehealth: Payer: Self-pay | Admitting: Internal Medicine

## 2017-07-11 MED ORDER — PIOGLITAZONE HCL 30 MG PO TABS: 30.0000 mg | ORAL_TABLET | Freq: Every day | ORAL | 3 refills | Status: DC

## 2017-07-11 NOTE — Telephone Encounter (Signed)
LMTCB, pt is to add the new medication with current medications. Do not stop any medications

## 2017-07-11 NOTE — Telephone Encounter (Signed)
Patient called back to follow up on lab results. He would like the nurse to call back. He had questions about the medication being added. Does he add this to the medication he is already taken or stop the others. Please follow up with patient. Thank you.

## 2017-07-11 NOTE — Telephone Encounter (Signed)
RX sent

## 2017-07-11 NOTE — Telephone Encounter (Signed)
Patient states pharmacy does not have medication.  [facility] uses [facility] [place] [address] [facility].

## 2017-07-16 ENCOUNTER — Telehealth: Payer: Self-pay | Admitting: Internal Medicine

## 2017-07-16 NOTE — Telephone Encounter (Signed)
Patient states that Dr. Macario GoldsPlot informed him on his last OV that he was going to get a new medication for diabetes.  [facility] [place] [facility] yet.  Please advise.

## 2017-07-16 NOTE — Telephone Encounter (Signed)
It was Actos - I emailed a Rx to [facility] last week. He should get it soon Thx

## 2017-07-17 NOTE — Telephone Encounter (Signed)
Per chart [facility] 07/11/17...Shearon Stalls/lb

## 2017-07-17 NOTE — Telephone Encounter (Signed)
Pt has spoke to [facility] and they have not received his ACTOS,  [facility] and [facility]

## 2017-07-17 NOTE — Telephone Encounter (Signed)
Left patient vm °

## 2017-07-28 ENCOUNTER — Other Ambulatory Visit: Payer: Self-pay | Admitting: Internal Medicine

## 2017-09-06 ENCOUNTER — Telehealth: Payer: Self-pay | Admitting: Internal Medicine

## 2017-09-06 DIAGNOSIS — M5440 Lumbago with sciatica, unspecified side: Principal | ICD-10-CM

## 2017-09-06 DIAGNOSIS — G8929 Other chronic pain: Secondary | ICD-10-CM

## 2017-09-06 MED ORDER — HYDROCODONE-ACETAMINOPHEN 5-325 MG PO TABS: 1.0000 | ORAL_TABLET | Freq: Two times a day (BID) | ORAL | 0 refills | Status: DC | PRN

## 2017-09-06 NOTE — Telephone Encounter (Signed)
Copied from CRM [phone]#[id number]. Topic: Quick Communication - See Telephone Encounter >> Sep 06, 2017  8:39 AM Trula SladeWalter, Linda F wrote: CRM for notification. See Telephone encounter for:  09/06/17. Patient would like to have his hydrocodone medication refilled.  [facility] [address] [place] said he had to go through his doctor to get this medication refilled.

## 2017-09-06 NOTE — Telephone Encounter (Signed)
Molli KnockOkay will email the prescription.  Office visit every 3 months.  Thank you

## 2017-09-06 NOTE — Telephone Encounter (Signed)
Check [place] registry last filled 07/06/2017.../lmb  

## 2017-09-10 ENCOUNTER — Other Ambulatory Visit: Payer: Self-pay | Admitting: Internal Medicine

## 2017-09-11 ENCOUNTER — Other Ambulatory Visit: Payer: Self-pay | Admitting: Internal Medicine

## 2017-09-12 ENCOUNTER — Other Ambulatory Visit: Payer: Self-pay

## 2017-09-13 ENCOUNTER — Telehealth: Payer: Self-pay | Admitting: Internal Medicine

## 2017-09-13 MED ORDER — PENICILLIN V POTASSIUM 500 MG PO TABS: ORAL_TABLET | ORAL | 0 refills | Status: DC

## 2017-09-13 NOTE — Telephone Encounter (Signed)
Ok He needs to see his dentist Thx

## 2017-09-13 NOTE — Telephone Encounter (Signed)
Notified pt w/MD response...Derry Skill/lmv

## 2017-09-13 NOTE — Telephone Encounter (Signed)
Copied from CRM #[id number]. Topic: General - [phone] >> Sep 13, 2017  9:36 AM Cecelia ByarsGreen, Jamil Armwood L, RMA wrote: Reason for CRM: Medication refill request for Penicillin 500 mg [facility] [place]

## 2017-09-17 ENCOUNTER — Telehealth: Payer: Self-pay | Admitting: Internal Medicine

## 2017-09-17 NOTE — Telephone Encounter (Signed)
Copied from CRM [phone]. Topic: General - Other >> Sep 17, 2017  4:52 PM Viviann SpareWhite, Selina wrote: Reason for CRM: Patient call requesting all his medications be send to the the pharmacy below:  [facility] [place], [place] - [address] [place] [id number] Phone: [phone] Fax: [phone]

## 2017-09-18 ENCOUNTER — Telehealth: Payer: Self-pay | Admitting: Internal Medicine

## 2017-09-18 MED ORDER — PIOGLITAZONE HCL 30 MG PO TABS: 30.0000 mg | ORAL_TABLET | Freq: Every day | ORAL | 1 refills | Status: DC

## 2017-09-18 MED ORDER — GLIPIZIDE 5 MG PO TABS: ORAL_TABLET | ORAL | 1 refills | Status: DC

## 2017-09-18 NOTE — Telephone Encounter (Signed)
All other medications was sent on 09/11/17. Sent actos to [facility].Marland Kitchen.[name]/lmb

## 2017-09-18 NOTE — Telephone Encounter (Signed)
[facility]ipizide  [facility] [address]   [place]  Because   [address]  [address]   [place] had  [place]  [place]

## 2017-09-18 NOTE — Telephone Encounter (Signed)
Copied from CRM [id number]. Topic: Quick Communication - Rx Refill/Question >> Sep 18, 2017  4:15 PM Eston Mouldavis, Tahirih Lair B wrote: Medication: glipiZIDE (GLUCOTROL) 5 MG tablet  PT states the pharmacy told him to Send as new rx    Has the patient contacted their pharmacy? Yes Fire at his normal  pharmacy   (Agent: If no, request that the patient contact the pharmacy for the refill.)   Preferred Pharmacy (with phone number or street name): [facility] - [place], [place] - [address] [phone] (Phone) [phone] (Fax)     Agent: Please be advised that RX refills may take up to 3 business days. We ask that you follow-up with your pharmacy.

## 2017-09-20 ENCOUNTER — Other Ambulatory Visit: Payer: Self-pay

## 2017-09-20 MED ORDER — PENICILLIN V POTASSIUM 500 MG PO TABS: ORAL_TABLET | ORAL | 0 refills | Status: DC

## 2017-09-28 ENCOUNTER — Encounter: Payer: Self-pay | Admitting: Internal Medicine

## 2017-09-28 ENCOUNTER — Ambulatory Visit (INDEPENDENT_AMBULATORY_CARE_PROVIDER_SITE_OTHER): Payer: Medicare HMO | Admitting: Internal Medicine

## 2017-09-28 DIAGNOSIS — E1165 Type 2 diabetes mellitus with hyperglycemia: Secondary | ICD-10-CM

## 2017-09-28 DIAGNOSIS — Z6831 Body mass index (BMI) 31.0-31.9, adult: Secondary | ICD-10-CM | POA: Diagnosis not present

## 2017-09-28 DIAGNOSIS — R972 Elevated prostate specific antigen [PSA]: Secondary | ICD-10-CM

## 2017-09-28 DIAGNOSIS — I10 Essential (primary) hypertension: Secondary | ICD-10-CM | POA: Diagnosis not present

## 2017-09-28 DIAGNOSIS — G8929 Other chronic pain: Secondary | ICD-10-CM

## 2017-09-28 DIAGNOSIS — N320 Bladder-neck obstruction: Secondary | ICD-10-CM | POA: Diagnosis not present

## 2017-09-28 DIAGNOSIS — M5440 Lumbago with sciatica, unspecified side: Secondary | ICD-10-CM | POA: Diagnosis not present

## 2017-09-28 DIAGNOSIS — E6609 Other obesity due to excess calories: Secondary | ICD-10-CM

## 2017-09-28 DIAGNOSIS — E538 Deficiency of other specified B group vitamins: Secondary | ICD-10-CM | POA: Diagnosis not present

## 2017-09-28 NOTE — Assessment & Plan Note (Signed)
Metformin Glipizide Risks associated with treatment and diet noncompliance were discussed. Compliance was encouraged. 

## 2017-09-28 NOTE — Assessment & Plan Note (Signed)
On B12 

## 2017-09-28 NOTE — Assessment & Plan Note (Signed)
Norco

## 2017-09-28 NOTE — Assessment & Plan Note (Signed)
Chronic Triamt/HCT 

## 2017-09-28 NOTE — Progress Notes (Signed)
Subjective:  Patient ID: Timothy Schmidt, male    DOB: 11-25-1955  Age: 62 y.o. MRN: [id number]  CC: No chief complaint on file.   HPI Timothy Schmidt presents for DM, HTN, LBP/OA f/u  Outpatient Medications Prior to Visit  Medication Sig Dispense Refill  . Alcohol Swabs (B-D SINGLE USE SWABS BUTTERFLY) PADS Use one two times daily.Dx: 250.00 100 each 3  . aspirin EC 81 MG tablet Take 81 mg by mouth daily.    . Blood Glucose Calibration (PRODIGY CONTROL SOLUTION) LOW SOLN Use as directed per meter instructions. 3 each 0  . Blood Glucose Monitoring Suppl (PRODIGY AUTOCODE BLOOD GLUCOSE) W/DEVICE KIT Use two times daily as directed. 1 each 0  . cholecalciferol (VITAMIN D) 1000 UNITS tablet Take 1 tablet (1,000 Units total) by mouth daily. 90 tablet 3  . ciprofloxacin (CIPRO) 500 MG tablet Take 500 mg by mouth.    . cyanocobalamin 1000 MCG tablet Take 0.5 tablets (500 mcg total) by mouth daily. 90 tablet 3  . glipiZIDE (GLUCOTROL) 5 MG tablet TAKE 1 TABLET BY MOUTH TWICE DAILY BEFORE MEAL(S) 180 tablet 1  . glucose blood (PRODIGY AUTOCODE TEST) test strip Use one two times daily. Dx: 250.00 100 each 3  . HYDROcodone-acetaminophen (NORCO/VICODIN) 5-325 MG tablet Take 1 tablet by mouth 2 (two) times daily as needed for severe pain. 60 tablet 0  . KLOR-CON M20 20 MEQ tablet TAKE ONE TABLET BY MOUTH ONCE DAILY 90 tablet 1  . KLOR-CON M20 20 MEQ tablet TAKE ONE TABLET BY MOUTH ONCE DAILY 90 tablet 1  . metFORMIN (GLUCOPHAGE) 1000 MG tablet TAKE 1 TABLET BY MOUTH TWICE DAILY WITH MEALS 180 tablet 1  . penicillin v potassium (VEETID) 500 MG tablet TAKE ONE TABLET BY MOUTH FOUR TIMES DAILY 120 tablet 0  . pioglitazone (ACTOS) 30 MG tablet Take 1 tablet (30 mg total) by mouth daily. 90 tablet 1  . PRODIGY TWIST TOP LANCETS 28G MISC Use one two times daily. Dx: 250.00 100 each 3  . triamterene-hydrochlorothiazide (MAXZIDE-25) 37.5-25 MG tablet TAKE ONE TABLET BY MOUTH ONCE DAILY 90 tablet 3  .  triamterene-hydrochlorothiazide (MAXZIDE-25) 37.5-25 MG tablet TAKE ONE TABLET BY MOUTH ONCE DAILY 90 tablet 1   No facility-administered medications prior to visit.     ROS Review of Systems  Constitutional: Negative for appetite change, fatigue and unexpected weight change.  HENT: Negative for congestion, nosebleeds, sneezing, sore throat and trouble swallowing.   Eyes: Negative for itching and visual disturbance.  Respiratory: Negative for cough.   Cardiovascular: Negative for chest pain, palpitations and leg swelling.  Gastrointestinal: Negative for abdominal distention, blood in stool, diarrhea and nausea.  Genitourinary: Negative for frequency and hematuria.  Musculoskeletal: Positive for back pain and gait problem. Negative for joint swelling and neck pain.  Skin: Negative for rash.  Neurological: Negative for dizziness, tremors, speech difficulty and weakness.  Psychiatric/Behavioral: Negative for agitation, dysphoric mood and sleep disturbance. The patient is not nervous/anxious.     Objective:  BP 130/60 (BP Location: Left Arm, Patient Position: Sitting, Cuff Size: Normal)   Pulse (!) 107   Temp 99 F (37.2 C) (Oral)   Ht 5' 7.5" (1.715 m)   Wt 207 lb (93.9 kg)   SpO2 (!) 66%   BMI 31.94 kg/m   BP Readings from Last 3 Encounters:  09/28/17 130/60  07/06/17 124/76  03/30/17 124/78    Wt Readings from Last 3 Encounters:  09/28/17 207 lb (93.9 kg)  07/06/17 206 lb (93.4 kg)  03/30/17 198 lb (89.8 kg)    Physical Exam  Constitutional: He is oriented to person, place, and time. He appears well-developed. No distress.  NAD  HENT:  Mouth/Throat: Oropharynx is clear and moist.  Eyes: Conjunctivae are normal. Pupils are equal, round, and reactive to light.  Neck: Normal range of motion. No JVD present. No thyromegaly present.  Cardiovascular: Normal rate, regular rhythm, normal heart sounds and intact distal pulses. Exam reveals no gallop and no friction rub.  No  murmur heard. Pulmonary/Chest: Effort normal and breath sounds normal. No respiratory distress. He has no wheezes. He has no rales. He exhibits no tenderness.  Abdominal: Soft. Bowel sounds are normal. He exhibits no distension and no mass. There is no tenderness. There is no rebound and no guarding.  Musculoskeletal: Normal range of motion. He exhibits tenderness. He exhibits no edema.  Lymphadenopathy:    He has no cervical adenopathy.  Neurological: He is alert and oriented to person, place, and time. He has normal reflexes. No cranial nerve deficit. He exhibits normal muscle tone. He displays a negative Romberg sign. Coordination and gait normal.  Skin: Skin is warm and dry. No rash noted.  Psychiatric: He has a normal mood and affect. His behavior is normal. Judgment and thought content normal.  obese   Lab Results  Component Value Date   WBC 9.4 08/04/2011   HGB 15.6 08/04/2011   HCT 46.0 08/04/2011   PLT 239.0 08/04/2011   GLUCOSE 97 03/30/2017   CHOL 156 01/02/2014   TRIG 265.0 (H) 01/02/2014   HDL 44.40 01/02/2014   LDLDIRECT 71.5 06/29/2010   LDLCALC 59 01/02/2014   ALT 14 11/24/2016   AST 11 11/24/2016   NA 143 03/30/2017   K 3.6 03/30/2017   CL 107 03/30/2017   CREATININE 1.18 03/30/2017   BUN 13 03/30/2017   CO2 28 03/30/2017   TSH 1.45 11/24/2016   PSA 5.71 (H) 11/24/2016   HGBA1C 9.1 (H) 07/06/2017   MICROALBUR 0.7 11/24/2016    Dg Chest 2 View  Result Date: 09/22/2010 *RADIOLOGY REPORT* Clinical Data: Shoulder spasm was obtained. CHEST - 2 VIEW Comparison: 08/05/2009 Findings: Normal heart size.  Linear scar at the left base.  Right lung is clear.  No pneumothorax or pleural effusion.  No acute bony deformity. IMPRESSION: No active cardiopulmonary disease. Original Report Authenticated By: Jamas Lav, M.D.  Dg Shoulder Left  Result Date: 09/22/2010 *RADIOLOGY REPORT* Clinical Data: Shoulder spasm was and pain LEFT SHOULDER - 2+ VIEW Comparison: None.  Findings: No acute fracture.  No dislocation.  Unremarkable soft tissues. IMPRESSION: No acute bony pathology. Original Report Authenticated By: Jamas Lav, M.D.   Assessment & Plan:   There are no diagnoses linked to this encounter. I am having Jw L. Zigmund Daniel maintain his cholecalciferol, cyanocobalamin, Prodigy Autocode Blood Glucose, glucose blood, PRODIGY TWIST TOP LANCETS 28G, PRODIGY CONTROL SOLUTION, B-D SINGLE USE SWABS BUTTERFLY, aspirin EC, KLOR-CON M20, ciprofloxacin, triamterene-hydrochlorothiazide, HYDROcodone-acetaminophen, metFORMIN, triamterene-hydrochlorothiazide, KLOR-CON M20, pioglitazone, glipiZIDE, and penicillin v potassium.  No orders of the defined types were placed in this encounter.    Follow-up: No Follow-up on file.  Walker Kehr, MD

## 2017-09-28 NOTE — Assessment & Plan Note (Signed)
Lab Results  Component Value Date   PSA 5.71 (H) 11/24/2016   PSA 1.71 10/28/2010   PSA 1.60 07/07/2008

## 2017-09-28 NOTE — Assessment & Plan Note (Signed)
Wt Readings from Last 3 Encounters:  09/28/17 207 lb (93.9 kg)  07/06/17 206 lb (93.4 kg)  03/30/17 198 lb (89.8 kg)

## 2017-09-29 ENCOUNTER — Other Ambulatory Visit: Payer: Self-pay | Admitting: Internal Medicine

## 2017-09-29 ENCOUNTER — Other Ambulatory Visit: Payer: Self-pay | Admitting: Family Medicine

## 2017-10-02 NOTE — Telephone Encounter (Signed)
Copied from CRM #[id number]. Topic: Quick Communication - Rx Refill/Question >> Oct 02, 2017  2:53 PM Percival SpanishKennedy, Cheryl W wrote: Medication    HYDROcodone-acetaminophen (NORCO/VICODIN) 5-325 MG tablet   [facility] [address]        nt: Please be advised that RX refills may take up to 3 business days. We ask that you follow-up with your pharmacy.

## 2017-10-02 NOTE — Telephone Encounter (Signed)
Forwarding refill request to Dr. Posey ReaPlotnikov for his approval../lmb

## 2017-10-02 NOTE — Telephone Encounter (Signed)
Refill of Norco  LOV 09/28/17    with Dr. Posey ReaPlotnikov  LRF 09/06/17    #60   0 refills  [facility] [place],  - [address]

## 2017-10-03 MED ORDER — HYDROCODONE-ACETAMINOPHEN 5-325 MG PO TABS: 1.0000 | ORAL_TABLET | Freq: Two times a day (BID) | ORAL | 0 refills | Status: DC | PRN

## 2017-10-03 NOTE — Telephone Encounter (Signed)
Ok Thx 

## 2017-10-03 NOTE — Telephone Encounter (Signed)
Called pt no answer LMOM MD has sent rx to [facility].Marland Kitchen.[name]/lmb

## 2017-10-08 ENCOUNTER — Ambulatory Visit: Payer: Medicare HMO | Admitting: Internal Medicine

## 2017-10-12 ENCOUNTER — Ambulatory Visit: Payer: Medicare HMO | Admitting: Internal Medicine

## 2017-11-21 ENCOUNTER — Telehealth: Payer: Self-pay | Admitting: Internal Medicine

## 2017-11-21 MED ORDER — HYDROCODONE-ACETAMINOPHEN 5-325 MG PO TABS: 1.0000 | ORAL_TABLET | Freq: Two times a day (BID) | ORAL | 0 refills | Status: DC | PRN

## 2017-11-21 NOTE — Telephone Encounter (Signed)
Please advise 

## 2017-11-21 NOTE — Telephone Encounter (Signed)
Done. Thx.

## 2017-11-21 NOTE — Telephone Encounter (Signed)
Patient requesting refill on hydrocodone.  [facility] [address] [place].

## 2017-12-14 ENCOUNTER — Ambulatory Visit: Payer: Medicare HMO | Admitting: Internal Medicine

## 2017-12-25 ENCOUNTER — Other Ambulatory Visit: Payer: Self-pay | Admitting: Pharmacist

## 2017-12-26 NOTE — Patient Outreach (Addendum)
[facility]) Care Management  12/26/2017  Timothy Schmidt 05-May-1956 [id number]   Patient was called as part of the [facility] Diabetes Scorecard Pilot. HIPAA identifiers were obtained. However, patient said he could not talk to me because he was not at home and did not think he could review his medications anyway. He requested that I attend his PCP visit with him on Friday 12/28/17 at 2:45pm (Dr. Posey Rea).   Plan: Spoke with Marja Kays, RN about obtaining the OK from the provider office about attending the visit.  Beecher Mcardle, PharmD, BCACP [facility] Clinical Pharmacist [phone]

## 2017-12-28 ENCOUNTER — Ambulatory Visit: Payer: Medicare HMO | Admitting: Internal Medicine

## 2017-12-31 ENCOUNTER — Ambulatory Visit: Payer: Self-pay | Admitting: Pharmacist

## 2017-12-31 ENCOUNTER — Other Ambulatory Visit: Payer: Self-pay | Admitting: Pharmacist

## 2017-12-31 NOTE — Patient Outreach (Signed)
[facility]) Care Management  12/31/2017  HADES MATHEW [birth year] [id number]   Patient was called back as part of the [facility] Diabetes Event organiser. Unfortunately, he did not answer his phone.  From chart review, his provider visit was reschedule from 12/28/17 to tomorrow 01/01/18.  Marja Kays, RN reached out to Dr. Loren Racer nurse via inbasket and e-mail about me accompanying the patient to his visit. We did not hear back from them.  Unfortunately, I will not be able to attend the appointment tomorrow anyway due to audit responsibilities.    Plan: I will follow up with the patient via telephone in 5-7 days.   Beecher Mcardle, PharmD, BCACP [facility] Clinical Pharmacist [phone]

## 2018-01-01 ENCOUNTER — Ambulatory Visit (INDEPENDENT_AMBULATORY_CARE_PROVIDER_SITE_OTHER): Payer: Medicare HMO | Admitting: Internal Medicine

## 2018-01-01 ENCOUNTER — Encounter: Payer: Self-pay | Admitting: Internal Medicine

## 2018-01-01 ENCOUNTER — Other Ambulatory Visit (INDEPENDENT_AMBULATORY_CARE_PROVIDER_SITE_OTHER): Payer: Medicare HMO

## 2018-01-01 VITALS — BP 128/74 | HR 98 | Temp 99.2°F | Ht 67.5 in | Wt 212.0 lb

## 2018-01-01 DIAGNOSIS — E1165 Type 2 diabetes mellitus with hyperglycemia: Secondary | ICD-10-CM | POA: Diagnosis not present

## 2018-01-01 DIAGNOSIS — M5440 Lumbago with sciatica, unspecified side: Secondary | ICD-10-CM

## 2018-01-01 DIAGNOSIS — Z6831 Body mass index (BMI) 31.0-31.9, adult: Secondary | ICD-10-CM

## 2018-01-01 DIAGNOSIS — E6609 Other obesity due to excess calories: Secondary | ICD-10-CM

## 2018-01-01 DIAGNOSIS — G8929 Other chronic pain: Secondary | ICD-10-CM

## 2018-01-01 DIAGNOSIS — N320 Bladder-neck obstruction: Secondary | ICD-10-CM | POA: Diagnosis not present

## 2018-01-01 DIAGNOSIS — R972 Elevated prostate specific antigen [PSA]: Secondary | ICD-10-CM | POA: Diagnosis not present

## 2018-01-01 LAB — PSA: PSA: 8.48 ng/mL — ABNORMAL HIGH (ref 0.10–4.00)

## 2018-01-01 LAB — TSH: TSH: 1.31 u[IU]/mL (ref 0.35–4.50)

## 2018-01-01 MED ORDER — TADALAFIL 20 MG PO TABS: 20.0000 mg | ORAL_TABLET | Freq: Every day | ORAL | 3 refills | Status: DC | PRN

## 2018-01-01 MED ORDER — HYDROCODONE-ACETAMINOPHEN 5-325 MG PO TABS: 1.0000 | ORAL_TABLET | Freq: Two times a day (BID) | ORAL | 0 refills | Status: DC | PRN

## 2018-01-01 NOTE — Assessment & Plan Note (Signed)
Chronic, poor control Metformin Glipizide Risks associated with treatment and diet noncompliance were discussed. Compliance was encouraged. 

## 2018-01-01 NOTE — Assessment & Plan Note (Addendum)
Labs F/u w/DR Vernie Ammons
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
## 2018-01-01 NOTE — Assessment & Plan Note (Signed)
Norco prn  Potential benefits of a long term prn opioids use as well as potential risks (i.e. addiction risk, apnea etc) and complications (i.e. Somnolence, constipation and others) were explained to the patient and were aknowledged.  

## 2018-01-01 NOTE — Assessment & Plan Note (Signed)
PSA

## 2018-01-01 NOTE — Assessment & Plan Note (Signed)
Wt Readings from Last 3 Encounters:  01/01/18 212 lb (96.2 kg)  09/28/17 207 lb (93.9 kg)  07/06/17 206 lb (93.4 kg)  discussed diet

## 2018-01-07 ENCOUNTER — Ambulatory Visit: Payer: Self-pay | Admitting: Pharmacist

## 2018-01-07 ENCOUNTER — Other Ambulatory Visit: Payer: Self-pay | Admitting: Pharmacist

## 2018-01-07 NOTE — Patient Outreach (Addendum)
[facility]) Care Management  01/07/2018  Timothy Schmidt 1956/02/13 [id number]   Patient was called regarding [facility] Diabetes Scorecard Pilot. Unfortunately, patient did not answer the phone. HIPAA compliant message was left on his voicemail.  Patient saw his PCP on 01/01/18. No new diabetes medications were ordered. Patient was encouraged to increase compliance to medications and diet.  HgA1c from 11/18 was 9.1%. It is unclear if a new HgA1c was ordered. Patient has a follow up appointment with PCP 04/05/18.  Patient is not on an ACE/ARB or a statin.  Plan: Call patient back in 7-10 business days for 1 final call he does not call me back.  Beecher Mcardle, PharmD, BCACP [facility] Clinical Pharmacist [phone]

## 2018-01-15 ENCOUNTER — Other Ambulatory Visit: Payer: Self-pay | Admitting: Pharmacist

## 2018-01-15 NOTE — Patient Outreach (Addendum)
[facility]) Care Management  01/15/2018  [name] 10-15-1955 [id number]   Patient was called regarding [facility] Diabetes Scorecard Pilot. HIPAA identifiers were obtained. After explaining who I was again, patient said the nurse at his provider's office told him to tell me if I needed information to call their office. An attempt was made to accompany the patient to a previous PCP visit by reaching out to Dr. Loren Racer staff without success. Patient's visit has been moved to 04/05/18.  Plan: Close patient's case and cease attempting to assist with diabetes management.  Beecher Mcardle, PharmD, BCACP [facility] Clinical Pharmacist [phone]

## 2018-01-16 ENCOUNTER — Ambulatory Visit: Payer: Self-pay | Admitting: Pharmacist

## 2018-01-25 ENCOUNTER — Ambulatory Visit: Payer: Medicare HMO | Admitting: Internal Medicine

## 2018-01-25 ENCOUNTER — Encounter

## 2018-01-31 DIAGNOSIS — Z1212 Encounter for screening for malignant neoplasm of rectum: Secondary | ICD-10-CM | POA: Diagnosis not present

## 2018-01-31 DIAGNOSIS — Z1211 Encounter for screening for malignant neoplasm of colon: Secondary | ICD-10-CM | POA: Diagnosis not present

## 2018-02-06 ENCOUNTER — Other Ambulatory Visit: Payer: Self-pay | Admitting: Internal Medicine

## 2018-02-06 NOTE — Telephone Encounter (Signed)
Copied from CRM [id number]. Topic: Quick Communication - See Telephone Encounter >> Feb 06, 2018 11:15 AM Jolayne Hainesaylor, Brittany L wrote: CRM for notification. See Telephone encounter for: 02/06/18.  HYDROcodone-acetaminophen (NORCO/VICODIN) 5-325 MG tablet  [facility] - [place], [place] - [address]

## 2018-02-07 ENCOUNTER — Ambulatory Visit: Payer: Medicare HMO | Admitting: Internal Medicine

## 2018-02-07 MED ORDER — HYDROCODONE-ACETAMINOPHEN 5-325 MG PO TABS: 1.0000 | ORAL_TABLET | Freq: Two times a day (BID) | ORAL | 0 refills | Status: DC | PRN

## 2018-02-07 NOTE — Telephone Encounter (Signed)
LOV  01/01/18 Dr. Posey ReaPlotnikov Last refill 01/01/18  # 60 with 0 refill

## 2018-02-09 LAB — COLOGUARD: Cologuard: NEGATIVE

## 2018-03-08 ENCOUNTER — Telehealth: Payer: Self-pay | Admitting: Internal Medicine

## 2018-03-08 MED ORDER — HYDROCODONE-ACETAMINOPHEN 5-325 MG PO TABS: 1.0000 | ORAL_TABLET | Freq: Two times a day (BID) | ORAL | 0 refills | Status: DC | PRN

## 2018-03-08 NOTE — Telephone Encounter (Signed)
Check [place] registry last filled 02/07/2018.Marland Kitchen.[name]/lmb

## 2018-03-08 NOTE — Telephone Encounter (Signed)
Ok Thx 

## 2018-03-08 NOTE — Telephone Encounter (Signed)
Copied from CRM [phone]#[id number]. Topic: Quick Communication - Rx Refill/Question >> Mar 08, 2018  8:34 AM Burchel, Abbi R wrote: Medication: HYDROcodone-acetaminophen (NORCO/VICODIN) 5-325 MG tablet  [facility]  [facility] [place], [place] - [id number]  [phone] (Phone) [phone] (Fax)  Pt: [phone]  Pt was advised that RX refills may take up to 3 business days.

## 2018-03-11 NOTE — Telephone Encounter (Signed)
MD approved and sent rx electronically to [facility]../lmb 

## 2018-03-12 ENCOUNTER — Encounter: Payer: Self-pay | Admitting: Internal Medicine

## 2018-03-19 ENCOUNTER — Other Ambulatory Visit: Payer: Self-pay | Admitting: Internal Medicine

## 2018-03-26 ENCOUNTER — Other Ambulatory Visit: Payer: Self-pay | Admitting: Internal Medicine

## 2018-04-04 DIAGNOSIS — R972 Elevated prostate specific antigen [PSA]: Secondary | ICD-10-CM | POA: Diagnosis not present

## 2018-04-04 DIAGNOSIS — N400 Enlarged prostate without lower urinary tract symptoms: Secondary | ICD-10-CM | POA: Diagnosis not present

## 2018-04-05 ENCOUNTER — Ambulatory Visit (INDEPENDENT_AMBULATORY_CARE_PROVIDER_SITE_OTHER): Payer: Medicare HMO | Admitting: Internal Medicine

## 2018-04-05 ENCOUNTER — Encounter: Payer: Self-pay | Admitting: Internal Medicine

## 2018-04-05 ENCOUNTER — Other Ambulatory Visit (INDEPENDENT_AMBULATORY_CARE_PROVIDER_SITE_OTHER): Payer: Medicare HMO

## 2018-04-05 DIAGNOSIS — E1140 Type 2 diabetes mellitus with diabetic neuropathy, unspecified: Secondary | ICD-10-CM

## 2018-04-05 DIAGNOSIS — I10 Essential (primary) hypertension: Secondary | ICD-10-CM | POA: Diagnosis not present

## 2018-04-05 DIAGNOSIS — E6609 Other obesity due to excess calories: Secondary | ICD-10-CM

## 2018-04-05 DIAGNOSIS — K029 Dental caries, unspecified: Secondary | ICD-10-CM | POA: Diagnosis not present

## 2018-04-05 DIAGNOSIS — Z6831 Body mass index (BMI) 31.0-31.9, adult: Secondary | ICD-10-CM

## 2018-04-05 DIAGNOSIS — E538 Deficiency of other specified B group vitamins: Secondary | ICD-10-CM | POA: Diagnosis not present

## 2018-04-05 DIAGNOSIS — E1165 Type 2 diabetes mellitus with hyperglycemia: Secondary | ICD-10-CM

## 2018-04-05 DIAGNOSIS — M5440 Lumbago with sciatica, unspecified side: Secondary | ICD-10-CM

## 2018-04-05 DIAGNOSIS — R972 Elevated prostate specific antigen [PSA]: Secondary | ICD-10-CM | POA: Diagnosis not present

## 2018-04-05 DIAGNOSIS — G8929 Other chronic pain: Secondary | ICD-10-CM | POA: Diagnosis not present

## 2018-04-05 DIAGNOSIS — IMO0002 Reserved for concepts with insufficient information to code with codable children: Secondary | ICD-10-CM

## 2018-04-05 LAB — HEMOGLOBIN A1C: HEMOGLOBIN A1C: 8 % — AB (ref 4.6–6.5)

## 2018-04-05 MED ORDER — FINASTERIDE 5 MG PO TABS: 5.0000 mg | ORAL_TABLET | Freq: Every day | ORAL | 3 refills | Status: DC

## 2018-04-05 MED ORDER — PENICILLIN V POTASSIUM 500 MG PO TABS: ORAL_TABLET | ORAL | 0 refills | Status: DC

## 2018-04-05 MED ORDER — HYDROCODONE-ACETAMINOPHEN 5-325 MG PO TABS: 1.0000 | ORAL_TABLET | Freq: Two times a day (BID) | ORAL | 0 refills | Status: DC | PRN

## 2018-04-05 NOTE — Progress Notes (Signed)
Subjective:  Patient ID: Timothy Schmidt, male    DOB: 1955-12-15  Age: 62 y.o. MRN: [id number]  CC: No chief complaint on file.   HPI Timothy Schmidt presents for LBD, DM, HTN f/u  Outpatient Medications Prior to Visit  Medication Sig Dispense Refill  . Alcohol Swabs (B-D SINGLE USE SWABS BUTTERFLY) PADS Use one two times daily.Dx: 250.00 100 each 3  . Ascorbic Acid (VITAMIN C WITH ROSE HIPS) 1000 MG tablet Take 1,000 mg by mouth daily.    Marland Kitchen aspirin EC 81 MG tablet Take 81 mg by mouth daily.    . Blood Glucose Calibration (PRODIGY CONTROL SOLUTION) LOW SOLN Use as directed per meter instructions. 3 each 0  . Blood Glucose Monitoring Suppl (PRODIGY AUTOCODE BLOOD GLUCOSE) W/DEVICE KIT Use two times daily as directed. 1 each 0  . cholecalciferol (VITAMIN D) 1000 UNITS tablet Take 1 tablet (1,000 Units total) by mouth daily. 90 tablet 3  . cyanocobalamin 1000 MCG tablet Take 0.5 tablets (500 mcg total) by mouth daily. 90 tablet 3  . glipiZIDE (GLUCOTROL) 5 MG tablet TAKE 1 TABLET BY MOUTH TWICE DAILY BEFORE MEAL(S) 180 tablet 1  . glipiZIDE (GLUCOTROL) 5 MG tablet TAKE 1 TABLET BY MOUTH TWICE DAILY BEFORE MEAL(S) 180 tablet 3  . glucose blood (PRODIGY AUTOCODE TEST) test strip Use one two times daily. Dx: 250.00 100 each 3  . guaiFENesin-Codeine (DIABETIC TUSSIN C PO) Take by mouth as needed.    Marland Kitchen HYDROcodone-acetaminophen (NORCO/VICODIN) 5-325 MG tablet Take 1 tablet by mouth 2 (two) times daily as needed for severe pain. 60 tablet 0  . KLOR-CON M20 20 MEQ tablet TAKE ONE TABLET BY MOUTH ONCE DAILY 90 tablet 1  . metFORMIN (GLUCOPHAGE) 1000 MG tablet TAKE 1 TABLET BY MOUTH TWICE DAILY WITH MEALS 180 tablet 1  . metFORMIN (GLUCOPHAGE) 1000 MG tablet TAKE 1 TABLET BY MOUTH TWICE DAILY WITH MEALS 180 tablet 1  . penicillin v potassium (VEETID) 500 MG tablet TAKE ONE TABLET BY MOUTH FOUR TIMES DAILY 120 tablet 0  . pioglitazone (ACTOS) 30 MG tablet Take 1 tablet (30 mg total) by mouth  daily. 90 tablet 1  . PRODIGY TWIST TOP LANCETS 28G MISC Use one two times daily. Dx: 250.00 100 each 3  . triamterene-hydrochlorothiazide (MAXZIDE-25) 37.5-25 MG tablet TAKE ONE TABLET BY MOUTH ONCE DAILY 90 tablet 1  . tadalafil (CIALIS) 20 MG tablet Take 1 tablet (20 mg total) by mouth daily as needed for erectile dysfunction. 10 tablet 3   No facility-administered medications prior to visit.     ROS: Review of Systems  Constitutional: Negative for appetite change, fatigue and unexpected weight change.  HENT: Negative for congestion, nosebleeds, sneezing, sore throat and trouble swallowing.   Eyes: Negative for itching and visual disturbance.  Respiratory: Negative for cough.   Cardiovascular: Negative for chest pain, palpitations and leg swelling.  Gastrointestinal: Negative for abdominal distention, blood in stool, diarrhea and nausea.  Genitourinary: Negative for frequency and hematuria.  Musculoskeletal: Positive for back pain and gait problem. Negative for joint swelling and neck pain.  Skin: Negative for rash.  Neurological: Negative for dizziness, tremors, speech difficulty and weakness.  Psychiatric/Behavioral: Negative for agitation, dysphoric mood and sleep disturbance. The patient is not nervous/anxious.     Objective:  BP 122/74 (BP Location: Left Arm, Patient Position: Sitting, Cuff Size: Large)   Pulse (!) 108   Temp 99.4 F (37.4 C) (Oral)   Ht 5' 7.5" (1.715 m)  Wt 210 lb (95.3 kg)   SpO2 99%   BMI 32.41 kg/m   BP Readings from Last 3 Encounters:  04/05/18 122/74  01/01/18 128/74  09/28/17 130/60    Wt Readings from Last 3 Encounters:  04/05/18 210 lb (95.3 kg)  01/01/18 212 lb (96.2 kg)  09/28/17 207 lb (93.9 kg)    Physical Exam  Constitutional: He is oriented to person, place, and time. He appears well-developed. No distress.  NAD  HENT:  Mouth/Throat: Oropharynx is clear and moist.  Eyes: Pupils are equal, round, and reactive to light.  Conjunctivae are normal.  Neck: Normal range of motion. No JVD present. No thyromegaly present.  Cardiovascular: Normal rate, regular rhythm, normal heart sounds and intact distal pulses. Exam reveals no gallop and no friction rub.  No murmur heard. Pulmonary/Chest: Effort normal and breath sounds normal. No respiratory distress. He has no wheezes. He has no rales. He exhibits no tenderness.  Abdominal: Soft. Bowel sounds are normal. He exhibits no distension and no mass. There is no tenderness. There is no rebound and no guarding.  Musculoskeletal: Normal range of motion. He exhibits tenderness. He exhibits no edema.  Lymphadenopathy:    He has no cervical adenopathy.  Neurological: He is alert and oriented to person, place, and time. He has normal reflexes. No cranial nerve deficit. He exhibits normal muscle tone. He displays a negative Romberg sign. Coordination and gait normal.  Skin: Skin is warm and dry. No rash noted.  Psychiatric: He has a normal mood and affect. His behavior is normal. Judgment and thought content normal.  obese Caries LS - tender  Lab Results  Component Value Date   WBC 9.4 08/04/2011   HGB 15.6 08/04/2011   HCT 46.0 08/04/2011   PLT 239.0 08/04/2011   GLUCOSE 97 03/30/2017   CHOL 156 01/02/2014   TRIG 265.0 (H) 01/02/2014   HDL 44.40 01/02/2014   LDLDIRECT 71.5 06/29/2010   LDLCALC 59 01/02/2014   ALT 14 11/24/2016   AST 11 11/24/2016   NA 143 03/30/2017   K 3.6 03/30/2017   CL 107 03/30/2017   CREATININE 1.18 03/30/2017   BUN 13 03/30/2017   CO2 28 03/30/2017   TSH 1.31 01/01/2018   PSA 8.48 (H) 01/01/2018   HGBA1C 9.1 (H) 07/06/2017   MICROALBUR 0.7 11/24/2016    Dg Chest 2 View  Result Date: 09/22/2010 *RADIOLOGY REPORT* Clinical Data: Shoulder spasm was obtained. CHEST - 2 VIEW Comparison: 08/05/2009 Findings: Normal heart size.  Linear scar at the left base.  Right lung is clear.  No pneumothorax or pleural effusion.  No acute bony  deformity. IMPRESSION: No active cardiopulmonary disease. Original Report Authenticated By: Jamas Lav, M.D.  Dg Shoulder Left  Result Date: 09/22/2010 *RADIOLOGY REPORT* Clinical Data: Shoulder spasm was and pain LEFT SHOULDER - 2+ VIEW Comparison: None. Findings: No acute fracture.  No dislocation.  Unremarkable soft tissues. IMPRESSION: No acute bony pathology. Original Report Authenticated By: Jamas Lav, M.D.   Assessment & Plan:   There are no diagnoses linked to this encounter.   No orders of the defined types were placed in this encounter.    Follow-up: No follow-ups on file.  Walker Kehr, MD

## 2018-04-05 NOTE — Assessment & Plan Note (Signed)
Vit B12 

## 2018-04-05 NOTE — Assessment & Plan Note (Addendum)
PCN VK He will see a dentist - [employer]

## 2018-04-05 NOTE — Assessment & Plan Note (Signed)
Metformin Glipizide Risks associated with treatment and diet noncompliance were discussed. Compliance was encouraged. 

## 2018-04-05 NOTE — Assessment & Plan Note (Signed)
Wt Readings from Last 3 Encounters:  04/05/18 210 lb (95.3 kg)  01/01/18 212 lb (96.2 kg)  09/28/17 207 lb (93.9 kg)

## 2018-04-05 NOTE — Assessment & Plan Note (Signed)
Norco prn  Potential benefits of a long term prn opioids use as well as potential risks (i.e. addiction risk, apnea etc) and complications (i.e. Somnolence, constipation and others) were explained to the patient and were aknowledged.  

## 2018-04-05 NOTE — Assessment & Plan Note (Signed)
Triamt/HCT 

## 2018-04-05 NOTE — Assessment & Plan Note (Addendum)
F/u w/Urology Onalee Huaavid saw Dr Vernie Ammonsttelin He will try Proscar, re-check PSA

## 2018-04-14 ENCOUNTER — Other Ambulatory Visit: Payer: Self-pay | Admitting: Internal Medicine

## 2018-05-09 ENCOUNTER — Other Ambulatory Visit: Payer: Self-pay | Admitting: Internal Medicine

## 2018-05-09 NOTE — Telephone Encounter (Signed)
Refill of Norco  LOV 04/05/18 Dr. Posey ReaPlotnikov  LRF 04/05/18  #60  0 refills   [facility] - [place], [place] - [address]    [phone] (Phone) [phone] (Fax

## 2018-05-09 NOTE — Telephone Encounter (Signed)
Copied from CRM [id number]. Topic: Quick Communication - Rx Refill/Question >> May 09, 2018 12:33 PM Baldo DaubAlexander, Amber L wrote: Medication: HYDROcodone-acetaminophen (NORCO/VICODIN) 5-325 MG tablet  Has the patient contacted their pharmacy? No - controlled substance (Agent: If no, request that the patient contact the pharmacy for the refill.) (Agent: If yes, when and what did the pharmacy advise?)  Preferred Pharmacy (with phone number or street name): [facility] - [place], [place] - [id number] [phone] (Phone) [phone] (Fax  Agent: Please be advised that RX refills may take up to 3 business days. We ask that you follow-up with your pharmacy.

## 2018-05-09 NOTE — Telephone Encounter (Signed)
Checked controlled substance database, last filled 04/05/18. Last OV: 04/05/18 Next OV: 07/11/18

## 2018-05-13 MED ORDER — HYDROCODONE-ACETAMINOPHEN 5-325 MG PO TABS: 1.0000 | ORAL_TABLET | Freq: Two times a day (BID) | ORAL | 0 refills | Status: DC | PRN

## 2018-05-15 ENCOUNTER — Other Ambulatory Visit: Payer: Self-pay

## 2018-05-15 NOTE — Telephone Encounter (Signed)
Please send electronically. Thank you  Copied from CRM [id number]. Topic: General - Other >> May 15, 2018  3:14 PM Percival SpanishKennedy, Cheryl W wrote:  RX was printed but not sure if RX has been sent over to the pharmacy because they called and they do not have a RX for this pt HYDROcodone-acetaminophen (NORCO/VICODIN) 5-325 MG tablet

## 2018-05-16 MED ORDER — HYDROCODONE-ACETAMINOPHEN 5-325 MG PO TABS: 1.0000 | ORAL_TABLET | Freq: Two times a day (BID) | ORAL | 0 refills | Status: DC | PRN

## 2018-06-11 ENCOUNTER — Ambulatory Visit: Payer: Medicare HMO | Admitting: Internal Medicine

## 2018-06-13 ENCOUNTER — Ambulatory Visit: Payer: Medicare HMO | Admitting: Internal Medicine

## 2018-06-18 ENCOUNTER — Other Ambulatory Visit: Payer: Self-pay | Admitting: Internal Medicine

## 2018-06-18 MED ORDER — HYDROCODONE-ACETAMINOPHEN 5-325 MG PO TABS: 1.0000 | ORAL_TABLET | Freq: Two times a day (BID) | ORAL | 0 refills | Status: DC | PRN

## 2018-06-18 NOTE — Telephone Encounter (Signed)
Copied from CRM [id number]. Topic: Quick Communication - Rx Refill/Question >> Jun 18, 2018  8:39 AM Maia Petties wrote: Medication: HYDROcodone-acetaminophen (NORCO/VICODIN) 5-325 MG tablet - pt is out - takes "as many as he can" per day Has the patient contacted their pharmacy? No - controlled Preferred Pharmacy (with phone number or street name):   [facility] - [place], [place] - [address] [phone] (Phone) [phone] (Fax)  Pt also asking to see Dr. Posey Rea before 11/21. He said he has bronchitis. He states he has been coughing up green mucus. He is asking for a call back if he can be worked in. He turns his phone off and can only receive texts.

## 2018-07-11 ENCOUNTER — Encounter: Payer: Self-pay | Admitting: Internal Medicine

## 2018-07-11 ENCOUNTER — Other Ambulatory Visit (INDEPENDENT_AMBULATORY_CARE_PROVIDER_SITE_OTHER): Payer: Medicare HMO

## 2018-07-11 ENCOUNTER — Ambulatory Visit (INDEPENDENT_AMBULATORY_CARE_PROVIDER_SITE_OTHER): Payer: Medicare HMO | Admitting: Internal Medicine

## 2018-07-11 DIAGNOSIS — M5440 Lumbago with sciatica, unspecified side: Secondary | ICD-10-CM

## 2018-07-11 DIAGNOSIS — E1140 Type 2 diabetes mellitus with diabetic neuropathy, unspecified: Secondary | ICD-10-CM | POA: Diagnosis not present

## 2018-07-11 DIAGNOSIS — I10 Essential (primary) hypertension: Secondary | ICD-10-CM | POA: Diagnosis not present

## 2018-07-11 DIAGNOSIS — E1165 Type 2 diabetes mellitus with hyperglycemia: Secondary | ICD-10-CM | POA: Diagnosis not present

## 2018-07-11 DIAGNOSIS — F411 Generalized anxiety disorder: Secondary | ICD-10-CM | POA: Diagnosis not present

## 2018-07-11 DIAGNOSIS — Z6831 Body mass index (BMI) 31.0-31.9, adult: Secondary | ICD-10-CM | POA: Diagnosis not present

## 2018-07-11 DIAGNOSIS — IMO0002 Reserved for concepts with insufficient information to code with codable children: Secondary | ICD-10-CM

## 2018-07-11 DIAGNOSIS — G8929 Other chronic pain: Secondary | ICD-10-CM

## 2018-07-11 DIAGNOSIS — E538 Deficiency of other specified B group vitamins: Secondary | ICD-10-CM

## 2018-07-11 DIAGNOSIS — E6609 Other obesity due to excess calories: Secondary | ICD-10-CM | POA: Diagnosis not present

## 2018-07-11 LAB — HEMOGLOBIN A1C: HEMOGLOBIN A1C: 7.7 % — AB (ref 4.6–6.5)

## 2018-07-11 MED ORDER — HYDROCODONE-ACETAMINOPHEN 5-325 MG PO TABS: 1.0000 | ORAL_TABLET | Freq: Two times a day (BID) | ORAL | 0 refills | Status: DC | PRN

## 2018-07-11 NOTE — Patient Instructions (Signed)

## 2018-07-11 NOTE — Progress Notes (Signed)
Subjective:  Patient ID: Timothy Schmidt, male    DOB: 01/02/1956  Age: 62 y.o. MRN: [id number]  CC: No chief complaint on file.   HPI Timothy Schmidt presents for DM, LBP, HTN f/u  Outpatient Medications Prior to Visit  Medication Sig Dispense Refill  . Alcohol Swabs (B-D SINGLE USE SWABS BUTTERFLY) PADS Use one two times daily.Dx: 250.00 100 each 3  . Ascorbic Acid (VITAMIN C WITH ROSE HIPS) 1000 MG tablet Take 1,000 mg by mouth daily.    Marland Kitchen aspirin EC 81 MG tablet Take 81 mg by mouth daily.    . Blood Glucose Calibration (PRODIGY CONTROL SOLUTION) LOW SOLN Use as directed per meter instructions. 3 each 0  . Blood Glucose Monitoring Suppl (PRODIGY AUTOCODE BLOOD GLUCOSE) W/DEVICE KIT Use two times daily as directed. 1 each 0  . cholecalciferol (VITAMIN D) 1000 UNITS tablet Take 1 tablet (1,000 Units total) by mouth daily. 90 tablet 3  . cyanocobalamin 1000 MCG tablet Take 0.5 tablets (500 mcg total) by mouth daily. 90 tablet 3  . finasteride (PROSCAR) 5 MG tablet Take 1 tablet (5 mg total) by mouth daily. 100 tablet 3  . glipiZIDE (GLUCOTROL) 5 MG tablet TAKE 1 TABLET BY MOUTH TWICE DAILY BEFORE MEAL(S) 180 tablet 3  . glucose blood (PRODIGY AUTOCODE TEST) test strip Use one two times daily. Dx: 250.00 100 each 3  . guaiFENesin-Codeine (DIABETIC TUSSIN C PO) Take by mouth as needed.    Marland Kitchen HYDROcodone-acetaminophen (NORCO/VICODIN) 5-325 MG tablet Take 1 tablet by mouth 2 (two) times daily as needed for severe pain. 60 tablet 0  . metFORMIN (GLUCOPHAGE) 1000 MG tablet TAKE 1 TABLET BY MOUTH TWICE DAILY WITH MEALS 180 tablet 1  . penicillin v potassium (VEETID) 500 MG tablet TAKE ONE TABLET BY MOUTH FOUR TIMES DAILY 120 tablet 0  . pioglitazone (ACTOS) 30 MG tablet Take 1 tablet (30 mg total) by mouth daily. 90 tablet 1  . potassium chloride SA (K-DUR,KLOR-CON) 20 MEQ tablet TAKE 1 TABLET BY MOUTH ONCE DAILY 90 tablet 3  . PRODIGY TWIST TOP LANCETS 28G MISC Use one two times daily. Dx:  250.00 100 each 3  . triamterene-hydrochlorothiazide (MAXZIDE-25) 37.5-25 MG tablet TAKE ONE TABLET BY MOUTH ONCE DAILY 90 tablet 1  . tadalafil (CIALIS) 20 MG tablet Take 1 tablet (20 mg total) by mouth daily as needed for erectile dysfunction. 10 tablet 3   No facility-administered medications prior to visit.     ROS: Review of Systems  Constitutional: Positive for unexpected weight change. Negative for appetite change and fatigue.  HENT: Negative for congestion, nosebleeds, sneezing, sore throat and trouble swallowing.   Eyes: Negative for itching and visual disturbance.  Respiratory: Negative for cough.   Cardiovascular: Negative for chest pain, palpitations and leg swelling.  Gastrointestinal: Negative for abdominal distention, blood in stool, diarrhea and nausea.  Genitourinary: Negative for frequency and hematuria.  Musculoskeletal: Positive for back pain. Negative for gait problem, joint swelling and neck pain.  Skin: Negative for rash.  Neurological: Negative for dizziness, tremors, speech difficulty and weakness.  Psychiatric/Behavioral: Negative for agitation, dysphoric mood and sleep disturbance. The patient is nervous/anxious.     Objective:  BP 130/78 (BP Location: Left Arm, Patient Position: Sitting, Cuff Size: Large)   Pulse 88   Temp 98.1 F (36.7 C) (Oral)   Ht 5' 7.5" (1.715 m)   Wt 214 lb (97.1 kg)   SpO2 98%   BMI 33.02 kg/m   BP Readings  from Last 3 Encounters:  07/11/18 130/78  04/05/18 122/74  01/01/18 128/74    Wt Readings from Last 3 Encounters:  07/11/18 214 lb (97.1 kg)  04/05/18 210 lb (95.3 kg)  01/01/18 212 lb (96.2 kg)    Physical Exam  Constitutional: He is oriented to person, place, and time. He appears well-developed. No distress.  NAD  HENT:  Mouth/Throat: Oropharynx is clear and moist.  Eyes: Pupils are equal, round, and reactive to light. Conjunctivae are normal.  Neck: Normal range of motion. No JVD present. No thyromegaly  present.  Cardiovascular: Normal rate, regular rhythm, normal heart sounds and intact distal pulses. Exam reveals no gallop and no friction rub.  No murmur heard. Pulmonary/Chest: Effort normal and breath sounds normal. No respiratory distress. He has no wheezes. He has no rales. He exhibits no tenderness.  Abdominal: Soft. Bowel sounds are normal. He exhibits no distension and no mass. There is no tenderness. There is no rebound and no guarding.  Musculoskeletal: Normal range of motion. He exhibits tenderness. He exhibits no edema.  Lymphadenopathy:    He has no cervical adenopathy.  Neurological: He is alert and oriented to person, place, and time. He has normal reflexes. No cranial nerve deficit. He exhibits normal muscle tone. He displays a negative Romberg sign. Coordination and gait normal.  Skin: Skin is warm and dry. No rash noted.  Psychiatric: He has a normal mood and affect. His behavior is normal. Judgment and thought content normal.  Obese LS tender  Lab Results  Component Value Date   WBC 9.4 08/04/2011   HGB 15.6 08/04/2011   HCT 46.0 08/04/2011   PLT 239.0 08/04/2011   GLUCOSE 97 03/30/2017   CHOL 156 01/02/2014   TRIG 265.0 (H) 01/02/2014   HDL 44.40 01/02/2014   LDLDIRECT 71.5 06/29/2010   LDLCALC 59 01/02/2014   ALT 14 11/24/2016   AST 11 11/24/2016   NA 143 03/30/2017   K 3.6 03/30/2017   CL 107 03/30/2017   CREATININE 1.18 03/30/2017   BUN 13 03/30/2017   CO2 28 03/30/2017   TSH 1.31 01/01/2018   PSA 8.48 (H) 01/01/2018   HGBA1C 8.0 (H) 04/05/2018   MICROALBUR 0.7 11/24/2016    Dg Chest 2 View  Result Date: 09/22/2010 *RADIOLOGY REPORT* Clinical Data: Shoulder spasm was obtained. CHEST - 2 VIEW Comparison: 08/05/2009 Findings: Normal heart size.  Linear scar at the left base.  Right lung is clear.  No pneumothorax or pleural effusion.  No acute bony deformity. IMPRESSION: No active cardiopulmonary disease. Original Report Authenticated By: Jamas Lav,  M.D.  Dg Shoulder Left  Result Date: 09/22/2010 *RADIOLOGY REPORT* Clinical Data: Shoulder spasm was and pain LEFT SHOULDER - 2+ VIEW Comparison: None. Findings: No acute fracture.  No dislocation.  Unremarkable soft tissues. IMPRESSION: No acute bony pathology. Original Report Authenticated By: Jamas Lav, M.D.   Assessment & Plan:   There are no diagnoses linked to this encounter.   No orders of the defined types were placed in this encounter.    Follow-up: No follow-ups on file.  Walker Kehr, MD

## 2018-07-11 NOTE — Assessment & Plan Note (Signed)
Triamt/HCT 

## 2018-07-11 NOTE — Assessment & Plan Note (Signed)
On B12 

## 2018-07-11 NOTE — Assessment & Plan Note (Signed)
Wt Readings from Last 3 Encounters:  07/11/18 214 lb (97.1 kg)  04/05/18 210 lb (95.3 kg)  01/01/18 212 lb (96.2 kg)

## 2018-07-11 NOTE — Assessment & Plan Note (Signed)
Norco prn  Potential benefits of a long term prn opioids use as well as potential risks (i.e. addiction risk, apnea etc) and complications (i.e. Somnolence, constipation and others) were explained to the patient and were aknowledged.  

## 2018-07-11 NOTE — Assessment & Plan Note (Signed)
Wellbutrin Potential benefits of a long term wellbutrin use as well as potential risks  and complications were explained to the patient and were aknowledged. 

## 2018-07-11 NOTE — Assessment & Plan Note (Addendum)
CT cardiac scoring option is discussed Metformin Glipizide Risks associated with treatment and diet noncompliance were discussed. Compliance was encouraged.

## 2018-07-12 ENCOUNTER — Other Ambulatory Visit: Payer: Self-pay | Admitting: Internal Medicine

## 2018-08-02 ENCOUNTER — Other Ambulatory Visit: Payer: Self-pay | Admitting: Internal Medicine

## 2018-08-02 NOTE — Telephone Encounter (Signed)
Copied from CRM [id number]. Topic: Quick Communication - Rx Refill/Question >> Aug 02, 2018 11:02 AM Angela NevinWilliams, Candice N wrote: Medication: HYDROcodone-acetaminophen (NORCO/VICODIN) 5-325 MG tablet  Patient is requesting a refill of this medication. Patient is requesting a call back when approved, if possible.    [facility]with phone number or [facility] name[facility] [place] [place] [facility] [id number]  [phone] (Phone) [phone] (Fax)

## 2018-08-06 MED ORDER — HYDROCODONE-ACETAMINOPHEN 5-325 MG PO TABS: 1.0000 | ORAL_TABLET | Freq: Two times a day (BID) | ORAL | 0 refills | Status: DC | PRN

## 2018-08-29 ENCOUNTER — Encounter: Payer: Self-pay | Admitting: Internal Medicine

## 2018-08-29 ENCOUNTER — Ambulatory Visit (INDEPENDENT_AMBULATORY_CARE_PROVIDER_SITE_OTHER): Payer: Medicare HMO | Admitting: Internal Medicine

## 2018-08-29 VITALS — BP 126/68 | HR 84 | Temp 98.9°F | Ht 67.5 in | Wt 201.0 lb

## 2018-08-29 DIAGNOSIS — K029 Dental caries, unspecified: Secondary | ICD-10-CM

## 2018-08-29 DIAGNOSIS — R6889 Other general symptoms and signs: Secondary | ICD-10-CM | POA: Diagnosis not present

## 2018-08-29 DIAGNOSIS — G8929 Other chronic pain: Secondary | ICD-10-CM | POA: Diagnosis not present

## 2018-08-29 DIAGNOSIS — J069 Acute upper respiratory infection, unspecified: Secondary | ICD-10-CM | POA: Diagnosis not present

## 2018-08-29 DIAGNOSIS — E1165 Type 2 diabetes mellitus with hyperglycemia: Secondary | ICD-10-CM

## 2018-08-29 DIAGNOSIS — M5440 Lumbago with sciatica, unspecified side: Secondary | ICD-10-CM | POA: Diagnosis not present

## 2018-08-29 LAB — POC INFLUENZA A&B (BINAX/QUICKVUE)
Influenza A, POC: NEGATIVE
Influenza B, POC: NEGATIVE

## 2018-08-29 LAB — GLUCOSE, POCT (MANUAL RESULT ENTRY): POC Glucose: 167 mg/dl — AB (ref 70–99)

## 2018-08-29 MED ORDER — AZITHROMYCIN 250 MG PO TABS: ORAL_TABLET | ORAL | 0 refills | Status: DC

## 2018-08-29 NOTE — Assessment & Plan Note (Signed)
Zpac 

## 2018-08-29 NOTE — Assessment & Plan Note (Signed)
cardiac CT scan for calcium scoring discussed

## 2018-08-29 NOTE — Patient Instructions (Signed)

## 2018-08-29 NOTE — Assessment & Plan Note (Signed)
PCN prn Dental school appt or Oral Surgery appt

## 2018-08-29 NOTE — Progress Notes (Signed)
Subjective:  Patient ID: Timothy Schmidt, male    DOB: 1955/08/24  Age: 63 y.o. MRN: [id number]  CC: No chief complaint on file.   HPI ROI JAFARI presents for chills, fever, arthralgias since this past weekend. He thinks it could be his teeth.  Outpatient Medications Prior to Visit  Medication Sig Dispense Refill  . Alcohol Swabs (B-D SINGLE USE SWABS BUTTERFLY) PADS Use one two times daily.Dx: 250.00 100 each 3  . Ascorbic Acid (VITAMIN C WITH ROSE HIPS) 1000 MG tablet Take 1,000 mg by mouth daily.    Marland Kitchen aspirin EC 81 MG tablet Take 81 mg by mouth daily.    . Blood Glucose Calibration (PRODIGY CONTROL SOLUTION) LOW SOLN Use as directed per meter instructions. 3 each 0  . Blood Glucose Monitoring Suppl (PRODIGY AUTOCODE BLOOD GLUCOSE) W/DEVICE KIT Use two times daily as directed. 1 each 0  . cholecalciferol (VITAMIN D) 1000 UNITS tablet Take 1 tablet (1,000 Units total) by mouth daily. 90 tablet 3  . cyanocobalamin 1000 MCG tablet Take 0.5 tablets (500 mcg total) by mouth daily. 90 tablet 3  . finasteride (PROSCAR) 5 MG tablet Take 1 tablet (5 mg total) by mouth daily. 100 tablet 3  . glipiZIDE (GLUCOTROL) 5 MG tablet TAKE 1 TABLET BY MOUTH TWICE DAILY BEFORE MEAL(S) 180 tablet 3  . glucose blood (PRODIGY AUTOCODE TEST) test strip Use one two times daily. Dx: 250.00 100 each 3  . guaiFENesin-Codeine (DIABETIC TUSSIN C PO) Take by mouth as needed.    Marland Kitchen HYDROcodone-acetaminophen (NORCO/VICODIN) 5-325 MG tablet Take 1 tablet by mouth 2 (two) times daily as needed for severe pain. 60 tablet 0  . metFORMIN (GLUCOPHAGE) 1000 MG tablet TAKE 1 TABLET BY MOUTH TWICE DAILY WITH MEALS 180 tablet 1  . penicillin v potassium (VEETID) 500 MG tablet TAKE ONE TABLET BY MOUTH FOUR TIMES DAILY 120 tablet 0  . pioglitazone (ACTOS) 30 MG tablet Take 1 tablet (30 mg total) by mouth daily. 90 tablet 1  . pioglitazone (ACTOS) 30 MG tablet TAKE 1 TABLET BY MOUTH ONCE DAILY 90 tablet 3  . potassium  chloride SA (K-DUR,KLOR-CON) 20 MEQ tablet TAKE 1 TABLET BY MOUTH ONCE DAILY 90 tablet 3  . PRODIGY TWIST TOP LANCETS 28G MISC Use one two times daily. Dx: 250.00 100 each 3  . triamterene-hydrochlorothiazide (MAXZIDE-25) 37.5-25 MG tablet TAKE ONE TABLET BY MOUTH ONCE DAILY 90 tablet 1  . tadalafil (CIALIS) 20 MG tablet Take 1 tablet (20 mg total) by mouth daily as needed for erectile dysfunction. 10 tablet 3   No facility-administered medications prior to visit.     ROS: Review of Systems  Constitutional: Positive for chills and fatigue. Negative for appetite change and unexpected weight change.  HENT: Negative for congestion, nosebleeds, sneezing, sore throat and trouble swallowing.   Eyes: Negative for itching and visual disturbance.  Respiratory: Negative for cough.   Cardiovascular: Negative for chest pain, palpitations and leg swelling.  Gastrointestinal: Negative for abdominal distention, blood in stool, diarrhea and nausea.  Genitourinary: Negative for frequency and hematuria.  Musculoskeletal: Negative for back pain, gait problem, joint swelling and neck pain.  Skin: Negative for rash.  Neurological: Negative for dizziness, tremors, speech difficulty and weakness.  Psychiatric/Behavioral: Negative for agitation, dysphoric mood, sleep disturbance and suicidal ideas. The patient is not nervous/anxious.     Objective:  BP 126/68 (BP Location: Left Arm, Patient Position: Sitting, Cuff Size: Large)   Pulse 84   Temp 98.9 F (  37.2 C) (Oral)   Ht 5' 7.5" (1.715 m)   Wt 201 lb (91.2 kg)   SpO2 97%   BMI 31.02 kg/m   BP Readings from Last 3 Encounters:  08/29/18 126/68  07/11/18 130/78  04/05/18 122/74    Wt Readings from Last 3 Encounters:  08/29/18 201 lb (91.2 kg)  07/11/18 214 lb (97.1 kg)  04/05/18 210 lb (95.3 kg)    Physical Exam Constitutional:      General: He is not in acute distress.    Appearance: He is well-developed.     Comments: NAD  Eyes:      Conjunctiva/sclera: Conjunctivae normal.     Pupils: Pupils are equal, round, and reactive to light.  Neck:     Musculoskeletal: Normal range of motion.     Thyroid: No thyromegaly.     Vascular: No JVD.  Cardiovascular:     Rate and Rhythm: Normal rate and regular rhythm.     Heart sounds: Normal heart sounds. No murmur. No friction rub. No gallop.   Pulmonary:     Effort: Pulmonary effort is normal. No respiratory distress.     Breath sounds: Normal breath sounds. No wheezing or rales.  Chest:     Chest wall: No tenderness.  Abdominal:     General: Bowel sounds are normal. There is no distension.     Palpations: Abdomen is soft. There is no mass.     Tenderness: There is no abdominal tenderness. There is no guarding or rebound.  Musculoskeletal: Normal range of motion.        General: No tenderness.  Lymphadenopathy:     Cervical: No cervical adenopathy.  Skin:    General: Skin is warm and dry.     Findings: No rash.  Neurological:     Mental Status: He is alert and oriented to person, place, and time.     Cranial Nerves: No cranial nerve deficit.     Motor: No abnormal muscle tone.     Coordination: Coordination normal.     Gait: Gait normal.     Deep Tendon Reflexes: Reflexes are normal and symmetric.  Psychiatric:        Behavior: Behavior normal.        Thought Content: Thought content normal.        Judgment: Judgment normal.   obese Caries eryth throat  Lab Results  Component Value Date   WBC 9.4 08/04/2011   HGB 15.6 08/04/2011   HCT 46.0 08/04/2011   PLT 239.0 08/04/2011   GLUCOSE 97 03/30/2017   CHOL 156 01/02/2014   TRIG 265.0 (H) 01/02/2014   HDL 44.40 01/02/2014   LDLDIRECT 71.5 06/29/2010   LDLCALC 59 01/02/2014   ALT 14 11/24/2016   AST 11 11/24/2016   NA 143 03/30/2017   K 3.6 03/30/2017   CL 107 03/30/2017   CREATININE 1.18 03/30/2017   BUN 13 03/30/2017   CO2 28 03/30/2017   TSH 1.31 01/01/2018   PSA 8.48 (H) 01/01/2018   HGBA1C 7.7  (H) 07/11/2018   MICROALBUR 0.7 11/24/2016    Dg Chest 2 View  Result Date: 09/22/2010 *RADIOLOGY REPORT* Clinical Data: Shoulder spasm was obtained. CHEST - 2 VIEW Comparison: 08/05/2009 Findings: Normal heart size.  Linear scar at the left base.  Right lung is clear.  No pneumothorax or pleural effusion.  No acute bony deformity. IMPRESSION: No active cardiopulmonary disease. Original Report Authenticated By: Jamas Lav, M.D.  Dg Shoulder Left  Result Date:  09/22/2010 *RADIOLOGY REPORT* Clinical Data: Shoulder spasm was and pain LEFT SHOULDER - 2+ VIEW Comparison: None. Findings: No acute fracture.  No dislocation.  Unremarkable soft tissues. IMPRESSION: No acute bony pathology. Original Report Authenticated By: Jamas Lav, M.D.   Assessment & Plan:   There are no diagnoses linked to this encounter.   No orders of the defined types were placed in this encounter.    Follow-up: No follow-ups on file.  Walker Kehr, MD

## 2018-08-29 NOTE — Assessment & Plan Note (Signed)
Norco prn  Potential benefits of a long term prn opioids use as well as potential risks (i.e. addiction risk, apnea etc) and complications (i.e. Somnolence, constipation and others) were explained to the patient and were aknowledged.  

## 2018-09-09 ENCOUNTER — Other Ambulatory Visit: Payer: Self-pay | Admitting: Internal Medicine

## 2018-09-09 NOTE — Telephone Encounter (Signed)
Copied from CRM [id number]. Topic: Quick Communication - Rx Refill/Question >> Sep 09, 2018  8:54 AM Burchel, Abbi R wrote: Medication: HYDROcodone-acetaminophen (NORCO/VICODIN) 5-325 MG tablet   Preferred Pharmacy: [facility] - [place], [place] - [address]  [phone] (Phone) [phone] (Fax)  Pt was advised that RX refills may take up to 3 business days. We ask that you follow-up with your pharmacy.

## 2018-09-12 NOTE — Telephone Encounter (Signed)
Patient calling to check status of refill. Advised of refill policy. Patient states that he will be out this weekend. Please advise.

## 2018-09-15 MED ORDER — HYDROCODONE-ACETAMINOPHEN 5-325 MG PO TABS: 1.0000 | ORAL_TABLET | Freq: Two times a day (BID) | ORAL | 0 refills | Status: DC | PRN

## 2018-09-22 ENCOUNTER — Other Ambulatory Visit: Payer: Self-pay | Admitting: Internal Medicine

## 2018-10-04 ENCOUNTER — Other Ambulatory Visit: Payer: Self-pay | Admitting: Internal Medicine

## 2018-10-08 ENCOUNTER — Other Ambulatory Visit: Payer: Self-pay | Admitting: Internal Medicine

## 2018-10-08 NOTE — Telephone Encounter (Signed)
Copied from CRM [id number]. Topic: Quick Communication - Rx Refill/Question >> Oct 08, 2018 11:24 AM Angela Nevin wrote: Medication: HYDROcodone-acetaminophen (NORCO/VICODIN) 5-325 MG  Patient is requesting a refill of this medication.   Preferred Pharmacy (with phone number or street name): [facility] - [place], [place] - [address] [phone] (Phone) [phone] (Fax)

## 2018-10-08 NOTE — Telephone Encounter (Signed)
Medication not delegated to NT to refill  

## 2018-10-09 MED ORDER — HYDROCODONE-ACETAMINOPHEN 5-325 MG PO TABS: 1.0000 | ORAL_TABLET | Freq: Two times a day (BID) | ORAL | 0 refills | Status: DC | PRN

## 2018-10-09 NOTE — Addendum Note (Signed)
Addended by: Scarlett Presto on: 10/09/2018 10:40 AM   Modules accepted: Orders

## 2018-10-09 NOTE — Telephone Encounter (Signed)
Checked controlled substance database, last filled 09/15/18

## 2018-10-10 ENCOUNTER — Ambulatory Visit: Payer: Medicare HMO | Admitting: Internal Medicine

## 2018-10-31 ENCOUNTER — Other Ambulatory Visit: Payer: Self-pay | Admitting: Internal Medicine

## 2018-11-11 ENCOUNTER — Ambulatory Visit: Payer: Self-pay | Admitting: Internal Medicine

## 2018-11-11 ENCOUNTER — Other Ambulatory Visit: Payer: Self-pay | Admitting: Internal Medicine

## 2018-11-11 NOTE — Telephone Encounter (Signed)
Copied from CRM [id number]. Topic: Quick Communication - Rx Refill/Question >> Nov 11, 2018 11:13 AM Mickel Baas B, NT wrote: Medication: HYDROcodone-acetaminophen (NORCO/VICODIN) 5-325 MG tablet  Has the patient contacted their pharmacy? yes (Agent: If no, request that the patient contact the pharmacy for the refill.) (Agent: If yes, when and what did the pharmacy advise?)  Preferred Pharmacy (with phone number or street name): [facility] - [place], [place] - [address]  Agent: Please be advised that RX refills may take up to 3 business days. We ask that you follow-up with your pharmacy.

## 2018-11-11 NOTE — Telephone Encounter (Signed)
Checked controlled substance database, last filled 10/12/18.

## 2018-11-14 ENCOUNTER — Other Ambulatory Visit: Payer: Self-pay

## 2018-11-14 ENCOUNTER — Ambulatory Visit (INDEPENDENT_AMBULATORY_CARE_PROVIDER_SITE_OTHER): Payer: Medicare Other | Admitting: Internal Medicine

## 2018-11-14 ENCOUNTER — Encounter: Payer: Self-pay | Admitting: Internal Medicine

## 2018-11-14 ENCOUNTER — Other Ambulatory Visit (INDEPENDENT_AMBULATORY_CARE_PROVIDER_SITE_OTHER): Payer: Medicare Other

## 2018-11-14 DIAGNOSIS — I10 Essential (primary) hypertension: Secondary | ICD-10-CM

## 2018-11-14 DIAGNOSIS — R972 Elevated prostate specific antigen [PSA]: Secondary | ICD-10-CM

## 2018-11-14 DIAGNOSIS — E538 Deficiency of other specified B group vitamins: Secondary | ICD-10-CM | POA: Diagnosis not present

## 2018-11-14 DIAGNOSIS — E1165 Type 2 diabetes mellitus with hyperglycemia: Secondary | ICD-10-CM

## 2018-11-14 DIAGNOSIS — G8929 Other chronic pain: Secondary | ICD-10-CM

## 2018-11-14 DIAGNOSIS — M5440 Lumbago with sciatica, unspecified side: Secondary | ICD-10-CM | POA: Diagnosis not present

## 2018-11-14 LAB — PSA: PSA: 7.76 ng/mL — ABNORMAL HIGH (ref 0.10–4.00)

## 2018-11-14 LAB — BASIC METABOLIC PANEL
BUN: 17 mg/dL (ref 6–23)
CHLORIDE: 103 meq/L (ref 96–112)
CO2: 24 mEq/L (ref 19–32)
Calcium: 10.3 mg/dL (ref 8.4–10.5)
Creatinine, Ser: 1.12 mg/dL (ref 0.40–1.50)
GFR: 80.23 mL/min (ref >60.00)
Glucose, Bld: 172 mg/dL — ABNORMAL HIGH (ref 70–99)
POTASSIUM: 3.9 meq/L (ref 3.5–5.1)
Sodium: 139 mEq/L (ref 135–145)

## 2018-11-14 LAB — HEMOGLOBIN A1C: Hgb A1c MFr Bld: 7.9 % — ABNORMAL HIGH (ref 4.6–6.5)

## 2018-11-14 MED ORDER — HYDROCODONE-ACETAMINOPHEN 5-325 MG PO TABS: 1.0000 | ORAL_TABLET | Freq: Two times a day (BID) | ORAL | 0 refills | Status: DC | PRN

## 2018-11-14 MED ORDER — CYANOCOBALAMIN 1000 MCG/ML IJ SOLN
1000.0000 ug | Freq: Once | INTRAMUSCULAR | Status: AC
  Administered 2018-11-14: 1000 ug via INTRAMUSCULAR

## 2018-11-14 NOTE — Assessment & Plan Note (Signed)
Norco prn  Potential benefits of a long term prn opioids use as well as potential risks (i.e. addiction risk, apnea etc) and complications (i.e. Somnolence, constipation and others) were explained to the patient and were aknowledged.  

## 2018-11-14 NOTE — Assessment & Plan Note (Signed)
Triamt/HCT 

## 2018-11-14 NOTE — Addendum Note (Signed)
Addended by: Scarlett Presto on: 11/14/2018 03:35 PM   Modules accepted: Orders

## 2018-11-14 NOTE — Assessment & Plan Note (Signed)
F/u w/Urology PSA

## 2018-11-14 NOTE — Progress Notes (Signed)
Subjective:  Patient ID: Timothy Schmidt, male    DOB: December 14, 1955  Age: 63 y.o. MRN: [id number]  CC: No chief complaint on file.   HPI Timothy Schmidt presents for LBP, DM, HTN f/u  Outpatient Medications Prior to Visit  Medication Sig Dispense Refill  . Alcohol Swabs (B-D SINGLE USE SWABS BUTTERFLY) PADS Use one two times daily.Dx: 250.00 100 each 3  . Ascorbic Acid (VITAMIN C WITH ROSE HIPS) 1000 MG tablet Take 1,000 mg by mouth daily.    Marland Kitchen aspirin EC 81 MG tablet Take 81 mg by mouth daily.    Marland Kitchen azithromycin (ZITHROMAX Z-PAK) 250 MG tablet As directed 6 tablet 0  . Blood Glucose Calibration (PRODIGY CONTROL SOLUTION) LOW SOLN Use as directed per meter instructions. 3 each 0  . Blood Glucose Monitoring Suppl (PRODIGY AUTOCODE BLOOD GLUCOSE) W/DEVICE KIT Use two times daily as directed. 1 each 0  . cholecalciferol (VITAMIN D) 1000 UNITS tablet Take 1 tablet (1,000 Units total) by mouth daily. 90 tablet 3  . cyanocobalamin 1000 MCG tablet Take 0.5 tablets (500 mcg total) by mouth daily. 90 tablet 3  . finasteride (PROSCAR) 5 MG tablet Take 1 tablet (5 mg total) by mouth daily. 100 tablet 3  . glipiZIDE (GLUCOTROL) 5 MG tablet TAKE 1 TABLET BY MOUTH TWICE DAILY BEFORE MEAL(S) 180 tablet 3  . glucose blood (PRODIGY AUTOCODE TEST) test strip Use one two times daily. Dx: 250.00 100 each 3  . guaiFENesin-Codeine (DIABETIC TUSSIN C PO) Take by mouth as needed.    Marland Kitchen HYDROcodone-acetaminophen (NORCO/VICODIN) 5-325 MG tablet Take 1 tablet by mouth 2 (two) times daily as needed for severe pain. 60 tablet 0  . metFORMIN (GLUCOPHAGE) 1000 MG tablet TAKE 1 TABLET BY MOUTH TWICE DAILY WITH MEALS 180 tablet 1  . penicillin v potassium (VEETID) 500 MG tablet TAKE 1 TABLET BY MOUTH 4 TIMES DAILY 120 tablet 0  . pioglitazone (ACTOS) 30 MG tablet Take 1 tablet (30 mg total) by mouth daily. 90 tablet 1  . pioglitazone (ACTOS) 30 MG tablet TAKE 1 TABLET BY MOUTH ONCE DAILY 90 tablet 3  . potassium chloride  SA (K-DUR,KLOR-CON) 20 MEQ tablet TAKE 1 TABLET BY MOUTH ONCE DAILY 90 tablet 3  . PRODIGY TWIST TOP LANCETS 28G MISC Use one two times daily. Dx: 250.00 100 each 3  . triamterene-hydrochlorothiazide (MAXZIDE-25) 37.5-25 MG tablet Take 1 tablet by mouth once daily 90 tablet 0  . tadalafil (CIALIS) 20 MG tablet Take 1 tablet (20 mg total) by mouth daily as needed for erectile dysfunction. 10 tablet 3   No facility-administered medications prior to visit.     ROS: Review of Systems  Constitutional: Positive for unexpected weight change. Negative for appetite change and fatigue.  HENT: Negative for congestion, nosebleeds, sneezing, sore throat and trouble swallowing.   Eyes: Negative for itching and visual disturbance.  Respiratory: Negative for cough.   Cardiovascular: Negative for chest pain, palpitations and leg swelling.  Gastrointestinal: Negative for abdominal distention, blood in stool, diarrhea and nausea.  Genitourinary: Negative for frequency and hematuria.  Musculoskeletal: Positive for back pain. Negative for gait problem, joint swelling and neck pain.  Skin: Negative for rash.  Neurological: Negative for dizziness, tremors, speech difficulty and weakness.  Psychiatric/Behavioral: Negative for agitation, dysphoric mood and sleep disturbance. The patient is not nervous/anxious.     Objective:  BP 124/64 (BP Location: Left Arm, Patient Position: Sitting, Cuff Size: Large)   Pulse 98   Temp 98.4  F (36.9 C) (Oral)   Ht 5' 7.5" (1.715 m)   Wt 215 lb (97.5 kg)   SpO2 96%   BMI 33.18 kg/m   BP Readings from Last 3 Encounters:  11/14/18 124/64  08/29/18 126/68  07/11/18 130/78    Wt Readings from Last 3 Encounters:  11/14/18 215 lb (97.5 kg)  08/29/18 201 lb (91.2 kg)  07/11/18 214 lb (97.1 kg)    Physical Exam Constitutional:      General: He is not in acute distress.    Appearance: He is well-developed. He is obese.     Comments: NAD  Eyes:      Conjunctiva/sclera: Conjunctivae normal.     Pupils: Pupils are equal, round, and reactive to light.  Neck:     Musculoskeletal: Normal range of motion.     Thyroid: No thyromegaly.     Vascular: No JVD.  Cardiovascular:     Rate and Rhythm: Normal rate and regular rhythm.     Heart sounds: Normal heart sounds. No murmur. No friction rub. No gallop.   Pulmonary:     Effort: Pulmonary effort is normal. No respiratory distress.     Breath sounds: Normal breath sounds. No wheezing or rales.  Chest:     Chest wall: No tenderness.  Abdominal:     General: Bowel sounds are normal. There is no distension.     Palpations: Abdomen is soft. There is no mass.     Tenderness: There is no abdominal tenderness. There is no guarding or rebound.  Musculoskeletal: Normal range of motion.        General: Tenderness present.  Lymphadenopathy:     Cervical: No cervical adenopathy.  Skin:    General: Skin is warm and dry.     Findings: No rash.  Neurological:     Mental Status: He is alert and oriented to person, place, and time.     Cranial Nerves: No cranial nerve deficit.     Motor: No abnormal muscle tone.     Coordination: Coordination normal.     Gait: Gait normal.     Deep Tendon Reflexes: Reflexes are normal and symmetric.  Psychiatric:        Behavior: Behavior normal.        Thought Content: Thought content normal.        Judgment: Judgment normal.   obese Stiff LS spine  Lab Results  Component Value Date   WBC 9.4 08/04/2011   HGB 15.6 08/04/2011   HCT 46.0 08/04/2011   PLT 239.0 08/04/2011   GLUCOSE 97 03/30/2017   CHOL 156 01/02/2014   TRIG 265.0 (H) 01/02/2014   HDL 44.40 01/02/2014   LDLDIRECT 71.5 06/29/2010   LDLCALC 59 01/02/2014   ALT 14 11/24/2016   AST 11 11/24/2016   NA 143 03/30/2017   K 3.6 03/30/2017   CL 107 03/30/2017   CREATININE 1.18 03/30/2017   BUN 13 03/30/2017   CO2 28 03/30/2017   TSH 1.31 01/01/2018   PSA 8.48 (H) 01/01/2018   HGBA1C 7.7 (H)  07/11/2018   MICROALBUR 0.7 11/24/2016    Dg Chest 2 View  Result Date: 09/22/2010 *RADIOLOGY REPORT* Clinical Data: Shoulder spasm was obtained. CHEST - 2 VIEW Comparison: 08/05/2009 Findings: Normal heart size.  Linear scar at the left base.  Right lung is clear.  No pneumothorax or pleural effusion.  No acute bony deformity. IMPRESSION: No active cardiopulmonary disease. Original Report Authenticated By: Jamas Lav, M.D.  Dg Shoulder  Left  Result Date: 09/22/2010 *RADIOLOGY REPORT* Clinical Data: Shoulder spasm was and pain LEFT SHOULDER - 2+ VIEW Comparison: None. Findings: No acute fracture.  No dislocation.  Unremarkable soft tissues. IMPRESSION: No acute bony pathology. Original Report Authenticated By: Jamas Lav, M.D.   Assessment & Plan:   There are no diagnoses linked to this encounter.   No orders of the defined types were placed in this encounter.    Follow-up: No follow-ups on file.  Walker Kehr, MD

## 2018-11-14 NOTE — Assessment & Plan Note (Signed)
On B12 

## 2018-11-14 NOTE — Assessment & Plan Note (Signed)
Chronic, poor control Metformin Glipizide Risks associated with treatment and diet noncompliance were discussed. Compliance was encouraged. 

## 2018-11-15 LAB — PMP SCREEN PROFILE (10S), URINE
AMPHETAMINE SCREEN URINE: NEGATIVE ng/mL
BARBITURATE SCREEN URINE: NEGATIVE ng/mL
BENZODIAZEPINE SCREEN, URINE: NEGATIVE ng/mL
CANNABINOIDS UR QL SCN: NEGATIVE ng/mL
Cocaine (Metab) Scrn, Ur: NEGATIVE ng/mL
Creatinine(Crt), U: 76.9 mg/dL (ref 20.0–300.0)
Methadone Screen, Urine: NEGATIVE ng/mL
OXYCODONE+OXYMORPHONE UR QL SCN: NEGATIVE ng/mL
Opiate Scrn, Ur: NEGATIVE ng/mL
PHENCYCLIDINE QUANTITATIVE URINE: NEGATIVE ng/mL
PROPOXYPHENE SCREEN URINE: NEGATIVE ng/mL
Ph of Urine: 5.7 (ref 4.5–8.9)

## 2018-12-06 ENCOUNTER — Telehealth: Payer: Self-pay | Admitting: Internal Medicine

## 2018-12-10 ENCOUNTER — Telehealth: Payer: Self-pay | Admitting: Internal Medicine

## 2018-12-10 NOTE — Telephone Encounter (Signed)
Copied from CRM [id number]. Topic: Quick Communication - Rx Refill/Question >> Dec 10, 2018  2:07 PM Jaquita Rector A wrote: Medication: HYDROcodone-acetaminophen (NORCO/VICODIN) 5-325 MG tablet    Has the patient contacted their pharmacy? Yes.   (Agent: If no, request that the patient contact the pharmacy for the refill.) (Agent: If yes, when and what did the pharmacy advise?)  Preferred Pharmacy (with phone number or street name): [facility] - [place], [place] - [address] [phone] (Phone) [phone] (Fax)    Agent: Please be advised that RX refills may take up to 3 business days. We ask that you follow-up with your pharmacy.

## 2018-12-11 ENCOUNTER — Telehealth: Payer: Self-pay | Admitting: Emergency Medicine

## 2018-12-11 MED ORDER — HYDROCODONE-ACETAMINOPHEN 5-325 MG PO TABS: 1.0000 | ORAL_TABLET | Freq: Two times a day (BID) | ORAL | 0 refills | Status: DC | PRN

## 2018-12-11 NOTE — Telephone Encounter (Signed)
Contacted pt to advise he can bring paperwork to office and we will complete.

## 2018-12-11 NOTE — Telephone Encounter (Signed)
Pt called in and states he has disability paperwork that needs to be completed. Pt last seen in March, does not have access for virtual visit. Does he need an office visit for paperwork to be completed.

## 2018-12-11 NOTE — Telephone Encounter (Signed)
Done. Thx.

## 2018-12-11 NOTE — Telephone Encounter (Signed)
Okay.  I have not seen it yet.  Thanks

## 2018-12-21 ENCOUNTER — Other Ambulatory Visit: Payer: Self-pay | Admitting: Internal Medicine

## 2019-01-01 NOTE — Telephone Encounter (Signed)
error 

## 2019-01-10 ENCOUNTER — Other Ambulatory Visit: Payer: Self-pay | Admitting: Internal Medicine

## 2019-01-10 NOTE — Telephone Encounter (Signed)
Copied from CRM [id number]. Topic: Quick Communication - Rx Refill/Question >> Jan 10, 2019  9:30 AM Darletta Moll L wrote: Medication: HYDROcodone-acetaminophen (NORCO/VICODIN) 5-325 MG tablet   Has the patient contacted their pharmacy? {yes  (Agent: If no, request that the patient contact the pharmacy for the refill.) (Agent: If yes, when and what did the pharmacy advise?)  Preferred Pharmacy (with phone number or street name): [facility] - [place], [place] - [address]  [address] [place]   Agent: Please be advised that RX refills may take up to 3 business days. We ask that you follow-up with your pharmacy.

## 2019-01-10 NOTE — Telephone Encounter (Signed)
Pt has appt scheduled 02/13/19

## 2019-01-15 NOTE — Telephone Encounter (Signed)
Patient is calling to follow up on this.  °

## 2019-01-16 MED ORDER — HYDROCODONE-ACETAMINOPHEN 5-325 MG PO TABS: 1.0000 | ORAL_TABLET | Freq: Two times a day (BID) | ORAL | 0 refills | Status: DC | PRN

## 2019-01-16 NOTE — Telephone Encounter (Signed)
Please advise about refill

## 2019-01-27 ENCOUNTER — Other Ambulatory Visit: Payer: Self-pay | Admitting: Internal Medicine

## 2019-02-12 ENCOUNTER — Telehealth: Payer: Self-pay | Admitting: Internal Medicine

## 2019-02-12 NOTE — Telephone Encounter (Signed)
Pt has appt tomorrow, will fill then

## 2019-02-12 NOTE — Telephone Encounter (Signed)
REFILL HYDROcodone-acetaminophen (NORCO/VICODIN) 5-325 MG tablet  [place], [place] [phone] (Phone) [phone] (Fax)

## 2019-02-13 ENCOUNTER — Other Ambulatory Visit: Payer: Self-pay

## 2019-02-13 ENCOUNTER — Other Ambulatory Visit (INDEPENDENT_AMBULATORY_CARE_PROVIDER_SITE_OTHER): Payer: Medicare HMO

## 2019-02-13 ENCOUNTER — Ambulatory Visit (INDEPENDENT_AMBULATORY_CARE_PROVIDER_SITE_OTHER): Payer: Medicare HMO | Admitting: Internal Medicine

## 2019-02-13 ENCOUNTER — Encounter: Payer: Self-pay | Admitting: Internal Medicine

## 2019-02-13 DIAGNOSIS — G8929 Other chronic pain: Secondary | ICD-10-CM | POA: Diagnosis not present

## 2019-02-13 DIAGNOSIS — R972 Elevated prostate specific antigen [PSA]: Secondary | ICD-10-CM | POA: Diagnosis not present

## 2019-02-13 DIAGNOSIS — E6609 Other obesity due to excess calories: Secondary | ICD-10-CM | POA: Diagnosis not present

## 2019-02-13 DIAGNOSIS — E1165 Type 2 diabetes mellitus with hyperglycemia: Secondary | ICD-10-CM

## 2019-02-13 DIAGNOSIS — IMO0002 Reserved for concepts with insufficient information to code with codable children: Secondary | ICD-10-CM

## 2019-02-13 DIAGNOSIS — M5440 Lumbago with sciatica, unspecified side: Secondary | ICD-10-CM

## 2019-02-13 DIAGNOSIS — E1140 Type 2 diabetes mellitus with diabetic neuropathy, unspecified: Secondary | ICD-10-CM | POA: Diagnosis not present

## 2019-02-13 DIAGNOSIS — I10 Essential (primary) hypertension: Secondary | ICD-10-CM

## 2019-02-13 DIAGNOSIS — Z6831 Body mass index (BMI) 31.0-31.9, adult: Secondary | ICD-10-CM | POA: Diagnosis not present

## 2019-02-13 DIAGNOSIS — E538 Deficiency of other specified B group vitamins: Secondary | ICD-10-CM

## 2019-02-13 LAB — HEMOGLOBIN A1C: Hgb A1c MFr Bld: 8 % — ABNORMAL HIGH (ref 4.6–6.5)

## 2019-02-13 MED ORDER — HYDROCODONE-ACETAMINOPHEN 5-325 MG PO TABS: 1.0000 | ORAL_TABLET | Freq: Two times a day (BID) | ORAL | 0 refills | Status: DC | PRN

## 2019-02-13 NOTE — Assessment & Plan Note (Signed)
Wt Readings from Last 3 Encounters:  02/13/19 212 lb (96.2 kg)  11/14/18 215 lb (97.5 kg)  08/29/18 201 lb (91.2 kg)

## 2019-02-13 NOTE — Assessment & Plan Note (Signed)
Stopped seeing Urology PSA

## 2019-02-13 NOTE — Assessment & Plan Note (Signed)
Norco prn  Potential benefits of a long term prn opioids use as well as potential risks (i.e. addiction risk, apnea etc) and complications (i.e. Somnolence, constipation and others) were explained to the patient and were aknowledged.  

## 2019-02-13 NOTE — Assessment & Plan Note (Signed)
Triamt/HCT 
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
## 2019-02-13 NOTE — Progress Notes (Signed)
Subjective:  Patient ID: Timothy Schmidt, male    DOB: Jan 09, 1956  Age: 63 y.o. MRN: [id number]  CC: No chief complaint on file.   HPI [name] presents for LBP, DM, HTN f/u  Outpatient Medications Prior to Visit  Medication Sig Dispense Refill  . Alcohol Swabs (B-D SINGLE USE SWABS BUTTERFLY) PADS Use one two times daily.Dx: 250.00 100 each 3  . Ascorbic Acid (VITAMIN C WITH ROSE HIPS) 1000 MG tablet Take 1,000 mg by mouth daily.    Marland Kitchen aspirin EC 81 MG tablet Take 81 mg by mouth daily.    . Blood Glucose Calibration (PRODIGY CONTROL SOLUTION) LOW SOLN Use as directed per meter instructions. 3 each 0  . Blood Glucose Monitoring Suppl (PRODIGY AUTOCODE BLOOD GLUCOSE) W/DEVICE KIT Use two times daily as directed. 1 each 0  . cholecalciferol (VITAMIN D) 1000 UNITS tablet Take 1 tablet (1,000 Units total) by mouth daily. 90 tablet 3  . cyanocobalamin 1000 MCG tablet Take 0.5 tablets (500 mcg total) by mouth daily. 90 tablet 3  . finasteride (PROSCAR) 5 MG tablet Take 1 tablet (5 mg total) by mouth daily. 100 tablet 3  . glipiZIDE (GLUCOTROL) 5 MG tablet TAKE 1 TABLET BY MOUTH TWICE DAILY BEFORE MEAL(S) 180 tablet 3  . glucose blood (PRODIGY AUTOCODE TEST) test strip Use one two times daily. Dx: 250.00 100 each 3  . guaiFENesin-Codeine (DIABETIC TUSSIN C PO) Take by mouth as needed.    Marland Kitchen HYDROcodone-acetaminophen (NORCO/VICODIN) 5-325 MG tablet Take 1 tablet by mouth 2 (two) times daily as needed for severe pain. 60 tablet 0  . metFORMIN (GLUCOPHAGE) 1000 MG tablet TAKE 1 TABLET BY MOUTH TWICE DAILY WITH MEALS 180 tablet 1  . penicillin v potassium (VEETID) 500 MG tablet Take 1 tablet by mouth 4 times daily 120 tablet 0  . pioglitazone (ACTOS) 30 MG tablet Take 1 tablet (30 mg total) by mouth daily. 90 tablet 1  . pioglitazone (ACTOS) 30 MG tablet TAKE 1 TABLET BY MOUTH ONCE DAILY 90 tablet 3  . potassium chloride SA (K-DUR,KLOR-CON) 20 MEQ tablet TAKE 1 TABLET BY MOUTH ONCE DAILY 90  tablet 3  . PRODIGY TWIST TOP LANCETS 28G MISC Use one two times daily. Dx: 250.00 100 each 3  . triamterene-hydrochlorothiazide (MAXZIDE-25) 37.5-25 MG tablet Take 1 tablet by mouth once daily 90 tablet 1  . tadalafil (CIALIS) 20 MG tablet Take 1 tablet (20 mg total) by mouth daily as needed for erectile dysfunction. 10 tablet 3  . azithromycin (ZITHROMAX Z-PAK) 250 MG tablet As directed 6 tablet 0   No facility-administered medications prior to visit.     ROS: Review of Systems  Constitutional: Positive for unexpected weight change. Negative for appetite change and fatigue.  HENT: Negative for congestion, nosebleeds, sneezing, sore throat and trouble swallowing.   Eyes: Negative for itching and visual disturbance.  Respiratory: Negative for cough.   Cardiovascular: Negative for chest pain, palpitations and leg swelling.  Gastrointestinal: Negative for abdominal distention, blood in stool, diarrhea and nausea.  Genitourinary: Negative for frequency and hematuria.  Musculoskeletal: Positive for back pain. Negative for gait problem, joint swelling and neck pain.  Skin: Negative for rash.  Neurological: Negative for dizziness, tremors, speech difficulty and weakness.  Psychiatric/Behavioral: Negative for agitation, dysphoric mood, sleep disturbance and suicidal ideas. The patient is not nervous/anxious.     Objective:  BP 124/76 (BP Location: Right Arm, Patient Position: Sitting, Cuff Size: Large)   Pulse (!) 103  Temp 98.9 F (37.2 C) (Oral)   Ht 5' 7.5" (1.715 m)   Wt 212 lb (96.2 kg)   SpO2 97%   BMI 32.71 kg/m   BP Readings from Last 3 Encounters:  02/13/19 124/76  11/14/18 124/64  08/29/18 126/68    Wt Readings from Last 3 Encounters:  02/13/19 212 lb (96.2 kg)  11/14/18 215 lb (97.5 kg)  08/29/18 201 lb (91.2 kg)    Physical Exam Constitutional:      General: He is not in acute distress.    Appearance: He is well-developed.     Comments: NAD  Eyes:      Conjunctiva/sclera: Conjunctivae normal.     Pupils: Pupils are equal, round, and reactive to light.  Neck:     Musculoskeletal: Normal range of motion.     Thyroid: No thyromegaly.     Vascular: No JVD.  Cardiovascular:     Rate and Rhythm: Normal rate and regular rhythm.     Heart sounds: Normal heart sounds. No murmur. No friction rub. No gallop.   Pulmonary:     Effort: Pulmonary effort is normal. No respiratory distress.     Breath sounds: Normal breath sounds. No wheezing or rales.  Chest:     Chest wall: No tenderness.  Abdominal:     General: Bowel sounds are normal. There is no distension.     Palpations: Abdomen is soft. There is no mass.     Tenderness: There is no abdominal tenderness. There is no guarding or rebound.  Musculoskeletal: Normal range of motion.        General: Tenderness present.  Lymphadenopathy:     Cervical: No cervical adenopathy.  Skin:    General: Skin is warm and dry.     Findings: No rash.  Neurological:     Mental Status: He is alert and oriented to person, place, and time.     Cranial Nerves: No cranial nerve deficit.     Motor: No abnormal muscle tone.     Coordination: Coordination normal.     Gait: Gait normal.     Deep Tendon Reflexes: Reflexes are normal and symmetric.  Psychiatric:        Behavior: Behavior normal.        Thought Content: Thought content normal.        Judgment: Judgment normal.    LS tender  Lab Results  Component Value Date   WBC 9.4 08/04/2011   HGB 15.6 08/04/2011   HCT 46.0 08/04/2011   PLT 239.0 08/04/2011   GLUCOSE 172 (H) 11/14/2018   CHOL 156 01/02/2014   TRIG 265.0 (H) 01/02/2014   HDL 44.40 01/02/2014   LDLDIRECT 71.5 06/29/2010   LDLCALC 59 01/02/2014   ALT 14 11/24/2016   AST 11 11/24/2016   NA 139 11/14/2018   K 3.9 11/14/2018   CL 103 11/14/2018   CREATININE 1.12 11/14/2018   BUN 17 11/14/2018   CO2 24 11/14/2018   TSH 1.31 01/01/2018   PSA 7.76 (H) 11/14/2018   HGBA1C 7.9 (H)  11/14/2018   MICROALBUR 0.7 11/24/2016    Dg Chest 2 View  Result Date: 09/22/2010 *RADIOLOGY REPORT* Clinical Data: Shoulder spasm was obtained. CHEST - 2 VIEW Comparison: 08/05/2009 Findings: Normal heart size.  Linear scar at the left base.  Right lung is clear.  No pneumothorax or pleural effusion.  No acute bony deformity. IMPRESSION: No active cardiopulmonary disease. Original Report Authenticated By: Jamas Lav, M.D.  Dg Shoulder Left  Result Date: 09/22/2010 *RADIOLOGY REPORT* Clinical Data: Shoulder spasm was and pain LEFT SHOULDER - 2+ VIEW Comparison: None. Findings: No acute fracture.  No dislocation.  Unremarkable soft tissues. IMPRESSION: No acute bony pathology. Original Report Authenticated By: Jamas Lav, M.D.   Assessment & Plan:   There are no diagnoses linked to this encounter.   No orders of the defined types were placed in this encounter.    Follow-up: No follow-ups on file.  Walker Kehr, MD

## 2019-02-13 NOTE — Assessment & Plan Note (Signed)
Metformin Glipizide Risks associated with treatment and diet noncompliance were discussed. Compliance was encouraged.

## 2019-02-13 NOTE — Assessment & Plan Note (Signed)
On B12 

## 2019-02-26 ENCOUNTER — Other Ambulatory Visit: Payer: Self-pay | Admitting: Internal Medicine

## 2019-03-10 ENCOUNTER — Other Ambulatory Visit: Payer: Self-pay | Admitting: Internal Medicine

## 2019-03-10 NOTE — Telephone Encounter (Signed)
[place] Controlled Database Checked Last filled: 02/13/19 # 60 LOV w/you: 02/13/19 Next appt w/you: 05/15/19

## 2019-03-10 NOTE — Telephone Encounter (Signed)
Medication Refill - Medication:HYDROcodone-acetaminophen (NORCO/VICODIN) 5-325 MG tablet  Has the patient contacted their pharmacy? Yes.   (Agent: If no, request that the patient contact the pharmacy for the refill.) (Agent: If yes, when and what did the pharmacy advise?)  Preferred Pharmacy (with phone number or street name):  [place] [phone] (Phone) [phone] (Fax)     Agent: Please be advised that RX refills may take up to 3 business days. We ask that you follow-up with your pharmacy.

## 2019-03-11 MED ORDER — HYDROCODONE-ACETAMINOPHEN 5-325 MG PO TABS: 1.0000 | ORAL_TABLET | Freq: Two times a day (BID) | ORAL | 0 refills | Status: DC | PRN

## 2019-04-05 ENCOUNTER — Other Ambulatory Visit: Payer: Self-pay | Admitting: Internal Medicine

## 2019-04-07 ENCOUNTER — Other Ambulatory Visit: Payer: Self-pay | Admitting: Internal Medicine

## 2019-04-11 ENCOUNTER — Telehealth: Payer: Self-pay | Admitting: Internal Medicine

## 2019-04-11 ENCOUNTER — Other Ambulatory Visit: Payer: Self-pay | Admitting: Internal Medicine

## 2019-04-11 MED ORDER — POTASSIUM CHLORIDE CRYS ER 20 MEQ PO TBCR: 20.0000 meq | EXTENDED_RELEASE_TABLET | Freq: Every day | ORAL | 0 refills | Status: DC

## 2019-04-11 NOTE — Telephone Encounter (Signed)
HYDROcodone-acetaminophen (NORCO/VICODIN) 5-325 MG tablet    Send to [facility]

## 2019-04-11 NOTE — Telephone Encounter (Signed)
Pt called to orders refill for HYDROcodone-acetaminophen (NORCO/VICODIN) 5-325 MG tablet  Sent to [facility]

## 2019-04-11 NOTE — Telephone Encounter (Signed)
[place] Controlled Database Checked Last filled: 03/12/19 # 60 LOV w/you: 02/13/19 Next appt w/you: 05/15/19

## 2019-04-11 NOTE — Addendum Note (Signed)
Addended by: Cresenciano Lick on: 04/11/2019 10:49 AM   Modules accepted: Orders

## 2019-04-12 MED ORDER — HYDROCODONE-ACETAMINOPHEN 5-325 MG PO TABS: 1.0000 | ORAL_TABLET | Freq: Two times a day (BID) | ORAL | 0 refills | Status: DC | PRN

## 2019-04-12 NOTE — Telephone Encounter (Signed)
Done. thanks

## 2019-05-05 ENCOUNTER — Other Ambulatory Visit: Payer: Self-pay | Admitting: Internal Medicine

## 2019-05-14 ENCOUNTER — Other Ambulatory Visit: Payer: Self-pay | Admitting: Internal Medicine

## 2019-05-15 ENCOUNTER — Ambulatory Visit (INDEPENDENT_AMBULATORY_CARE_PROVIDER_SITE_OTHER): Payer: Medicare HMO | Admitting: Internal Medicine

## 2019-05-15 ENCOUNTER — Other Ambulatory Visit: Payer: Self-pay

## 2019-05-15 ENCOUNTER — Telehealth: Payer: Self-pay | Admitting: Internal Medicine

## 2019-05-15 ENCOUNTER — Encounter: Payer: Self-pay | Admitting: Internal Medicine

## 2019-05-15 DIAGNOSIS — E538 Deficiency of other specified B group vitamins: Secondary | ICD-10-CM | POA: Diagnosis not present

## 2019-05-15 DIAGNOSIS — M5440 Lumbago with sciatica, unspecified side: Secondary | ICD-10-CM

## 2019-05-15 DIAGNOSIS — E1165 Type 2 diabetes mellitus with hyperglycemia: Secondary | ICD-10-CM

## 2019-05-15 DIAGNOSIS — G8929 Other chronic pain: Secondary | ICD-10-CM | POA: Diagnosis not present

## 2019-05-15 DIAGNOSIS — I10 Essential (primary) hypertension: Secondary | ICD-10-CM | POA: Diagnosis not present

## 2019-05-15 DIAGNOSIS — R972 Elevated prostate specific antigen [PSA]: Secondary | ICD-10-CM

## 2019-05-15 MED ORDER — HYDROCODONE-ACETAMINOPHEN 5-325 MG PO TABS: 1.0000 | ORAL_TABLET | Freq: Two times a day (BID) | ORAL | 0 refills | Status: DC | PRN

## 2019-05-15 NOTE — Telephone Encounter (Signed)
Requested medication (s) are due for refill today: yes  Requested medication (s) are on the active medication list: yes  Last refill:  04/12/2019  Future visit scheduled: yes  Notes to clinic:  Refill cannot be delegated    Requested Prescriptions  Pending Prescriptions Disp Refills   HYDROcodone-acetaminophen (NORCO/VICODIN) 5-325 MG tablet 60 tablet 0    Sig: Take 1 tablet by mouth 2 (two) times daily as needed for severe pain.     Not Delegated - Analgesics:  Opioid Agonist Combinations Failed - 05/15/2019 12:54 PM      Failed - This refill cannot be delegated      Failed - Urine Drug Screen completed in last 360 days.      Passed - Valid encounter within last 6 months    Recent Outpatient Visits          3 months ago Essential hypertension   [place], Evie Lacks, MD   6 months ago Essential hypertension   [place], Evie Lacks, MD   8 months ago Flu-like symptoms   [place], Evie Lacks, MD   10 months ago Essential hypertension   [facility], Evie Lacks, MD   1 year ago Essential hypertension   [place], Evie Lacks, MD      Future Appointments            Today Plot[place], Evie Lacks, MD [place], [place]

## 2019-05-15 NOTE — Telephone Encounter (Signed)
Medication Refill - Medication:  HYDROcodone-acetaminophen (NORCO/VICODIN) 5-325 MG tablet  Has the patient contacted their pharmacy? Yes advised to call office.  Preferred Pharmacy (with phone number or street name):  [place] [phone] (Phone) [phone] (Fax)   Agent: Please be advised that RX refills may take up to 3 business days. We ask that you follow-up with your pharmacy.

## 2019-05-15 NOTE — Patient Instructions (Addendum)
  These suggestions will probably help you to improve your metabolism if you are not overweight and to lose weight if you are overweight: 1.  Reduce your consumption of sugars and starches.  Eliminate high fructose corn syrup from your diet.  Reduce your consumption of processed foods.  For desserts try to have seasonal fruits, berries, nuts, cheeses or dark chocolate with more than 70% cacao. 2.  Do not snack 3.  You do not have to eat breakfast.  If you choose to have breakfast-eat plain greek yogurt, eggs, oatmeal (without sugar) 4.  Drink water, freshly brewed unsweetened tea (green, black or herbal) or coffee.  Do not drink sodas including diet sodas , juices, beverages sweetened with artificial sweeteners. 5.  Reduce your consumption of refined grains. 6.  Avoid protein drinks such as Optifast, Slim fast etc. Eat chicken, fish, meat, dairy and beans for your sources of protein 7.  Natural unprocessed fats like cold pressed virgin olive oil, butter, coconut oil are good for you.  Eat avocados 8.  Increase your consumption of fiber.  Fruits, berries, vegetables, whole grains, flaxseeds, Chia seeds, beans, popcorn, nuts, oatmeal are good sources of fiber 9.  Use vinegar in your diet, i.e. apple cider vinegar, red wine or balsamic vinegar 10.  You can try fasting.  For example you can skip breakfast and lunch every other day (24-hour fast) 11.  Stress reduction, good night sleep, relaxation, meditation, yoga and other physical activity is likely to help you to maintain low weight too. 12.  If you drink alcohol, limit your alcohol intake to no more than 2 drinks a day.    Cabbage soup recipe that will not make you gain weight: Take 1 small head of cabbage, 1 average pack of celery, 4 green peppers, 4 onions, 2 cans diced tomatoes (they are not available without salt), salt and spices to taste.  Chop cabbage, celery, peppers and onions.  And tomatoes and 2-2.5 liters (2.5 quarts) of water so that it  would just cover the vegetables.  Bring to boil.  Add spices and salt.  Turn heat to low/medium and simmer for 20-25 minutes.  Naturally, you can make a smaller batch and change some of the ingredients.     If you have medicare related insurance (such as traditional Medicare, Blue Cross Medicare, United HealthCare Medicare, or similar), Please make an appointment at the scheduling desk with Jill, the Wellness Health Coach, for your Wellness visit in this office, which is a benefit with your insurance.  

## 2019-05-15 NOTE — Assessment & Plan Note (Signed)
Finasteride

## 2019-05-15 NOTE — Assessment & Plan Note (Signed)
Triamt/HCT 

## 2019-05-15 NOTE — Assessment & Plan Note (Signed)
On B12 

## 2019-05-15 NOTE — Progress Notes (Signed)
Subjective:  Patient ID: Timothy Schmidt, male    DOB: [birth year]  Age: 63 y.o. MRN: [id number]  CC: No chief complaint on file.   HPI Timothy Schmidt presents for DM, OA, LBP, HTN  Outpatient Medications Prior to Visit  Medication Sig Dispense Refill  . Alcohol Swabs (B-D SINGLE USE SWABS BUTTERFLY) PADS Use one two times daily.Dx: 250.00 100 each 3  . Ascorbic Acid (VITAMIN C WITH ROSE HIPS) 1000 MG tablet Take 1,000 mg by mouth daily.    Marland Kitchen aspirin EC 81 MG tablet Take 81 mg by mouth daily.    . Blood Glucose Calibration (PRODIGY CONTROL SOLUTION) LOW SOLN Use as directed per meter instructions. 3 each 0  . Blood Glucose Monitoring Suppl (PRODIGY AUTOCODE BLOOD GLUCOSE) W/DEVICE KIT Use two times daily as directed. 1 each 0  . cholecalciferol (VITAMIN D) 1000 UNITS tablet Take 1 tablet (1,000 Units total) by mouth daily. 90 tablet 3  . cyanocobalamin 1000 MCG tablet Take 0.5 tablets (500 mcg total) by mouth daily. 90 tablet 3  . finasteride (PROSCAR) 5 MG tablet Take 1 tablet by mouth once daily 30 tablet 0  . glipiZIDE (GLUCOTROL) 5 MG tablet TAKE 1 TABLET BY MOUTH TWICE DAILY BEFORE MEAL(S) 180 tablet 3  . glucose blood (PRODIGY AUTOCODE TEST) test strip Use one two times daily. Dx: 250.00 100 each 3  . guaiFENesin-Codeine (DIABETIC TUSSIN C PO) Take by mouth as needed.    Marland Kitchen HYDROcodone-acetaminophen (NORCO/VICODIN) 5-325 MG tablet Take 1 tablet by mouth 2 (two) times daily as needed for severe pain. 60 tablet 0  . metFORMIN (GLUCOPHAGE) 1000 MG tablet TAKE 1 TABLET BY MOUTH TWICE DAILY WITH MEALS 180 tablet 0  . penicillin v potassium (VEETID) 500 MG tablet Take 1 tablet by mouth 4 times daily 120 tablet 0  . pioglitazone (ACTOS) 30 MG tablet Take 1 tablet (30 mg total) by mouth daily. 90 tablet 1  . pioglitazone (ACTOS) 30 MG tablet TAKE 1 TABLET BY MOUTH ONCE DAILY 90 tablet 3  . potassium chloride SA (KLOR-CON M20) 20 MEQ tablet Take 1 tablet (20 mEq total) by mouth daily. 90  tablet 0  . PRODIGY TWIST TOP LANCETS 28G MISC Use one two times daily. Dx: 250.00 100 each 3  . triamterene-hydrochlorothiazide (MAXZIDE-25) 37.5-25 MG tablet Take 1 tablet by mouth once daily 90 tablet 1  . tadalafil (CIALIS) 20 MG tablet Take 1 tablet (20 mg total) by mouth daily as needed for erectile dysfunction. 10 tablet 3   No facility-administered medications prior to visit.     ROS: Review of Systems  Constitutional: Positive for unexpected weight change. Negative for appetite change and fatigue.  HENT: Negative for congestion, nosebleeds, sneezing, sore throat and trouble swallowing.   Eyes: Negative for itching and visual disturbance.  Respiratory: Negative for cough.   Cardiovascular: Negative for chest pain, palpitations and leg swelling.  Gastrointestinal: Negative for abdominal distention, blood in stool, diarrhea and nausea.  Genitourinary: Negative for frequency and hematuria.  Musculoskeletal: Positive for arthralgias, back pain and neck stiffness. Negative for gait problem, joint swelling and neck pain.  Skin: Negative for rash.  Neurological: Negative for dizziness, tremors, speech difficulty and weakness.  Psychiatric/Behavioral: Negative for agitation, dysphoric mood, sleep disturbance and suicidal ideas. The patient is not nervous/anxious.     Objective:  BP (!) 146/84 (BP Location: Left Arm, Patient Position: Sitting, Cuff Size: Large)   Pulse 98   Temp 98.6 F (37 C) (Oral)  Ht 5' 7.5" (1.715 m)   Wt 214 lb (97.1 kg)   SpO2 96%   BMI 33.02 kg/m   BP Readings from Last 3 Encounters:  05/15/19 (!) 146/84  02/13/19 124/76  11/14/18 124/64    Wt Readings from Last 3 Encounters:  05/15/19 214 lb (97.1 kg)  02/13/19 212 lb (96.2 kg)  11/14/18 215 lb (97.5 kg)    Physical Exam Constitutional:      General: He is not in acute distress.    Appearance: He is well-developed. He is obese.     Comments: NAD  Eyes:     Conjunctiva/sclera: Conjunctivae  normal.     Pupils: Pupils are equal, round, and reactive to light.  Neck:     Musculoskeletal: Normal range of motion.     Thyroid: No thyromegaly.     Vascular: No JVD.  Cardiovascular:     Rate and Rhythm: Normal rate and regular rhythm.     Heart sounds: Normal heart sounds. No murmur. No friction rub. No gallop.   Pulmonary:     Effort: Pulmonary effort is normal. No respiratory distress.     Breath sounds: Normal breath sounds. No wheezing or rales.  Chest:     Chest wall: No tenderness.  Abdominal:     General: Bowel sounds are normal. There is no distension.     Palpations: Abdomen is soft. There is no mass.     Tenderness: There is no abdominal tenderness. There is no guarding or rebound.  Musculoskeletal: Normal range of motion.        General: Tenderness present.  Lymphadenopathy:     Cervical: No cervical adenopathy.  Skin:    General: Skin is warm and dry.     Findings: No rash.  Neurological:     Mental Status: He is alert and oriented to person, place, and time.     Cranial Nerves: No cranial nerve deficit.     Motor: No abnormal muscle tone.     Coordination: Coordination normal.     Gait: Gait normal.     Deep Tendon Reflexes: Reflexes are normal and symmetric.  Psychiatric:        Behavior: Behavior normal.        Thought Content: Thought content normal.        Judgment: Judgment normal.   Obese LS painful  Lab Results  Component Value Date   WBC 9.4 08/04/2011   HGB 15.6 08/04/2011   HCT 46.0 08/04/2011   PLT 239.0 08/04/2011   GLUCOSE 172 (H) 11/14/2018   CHOL 156 01/02/2014   TRIG 265.0 (H) 01/02/2014   HDL 44.40 01/02/2014   LDLDIRECT 71.5 06/29/2010   LDLCALC 59 01/02/2014   ALT 14 11/24/2016   AST 11 11/24/2016   NA 139 11/14/2018   K 3.9 11/14/2018   CL 103 11/14/2018   CREATININE 1.12 11/14/2018   BUN 17 11/14/2018   CO2 24 11/14/2018   TSH 1.31 01/01/2018   PSA 7.76 (H) 11/14/2018   HGBA1C 8.0 (H) 02/13/2019   MICROALBUR 0.7  11/24/2016    Dg Chest 2 View  Result Date: 09/22/2010 *RADIOLOGY REPORT* Clinical Data: Shoulder spasm was obtained. CHEST - 2 VIEW Comparison: 08/05/2009 Findings: Normal heart size.  Linear scar at the left base.  Right lung is clear.  No pneumothorax or pleural effusion.  No acute bony deformity. IMPRESSION: No active cardiopulmonary disease. Original Report Authenticated By: Jamas Lav, M.D.  Dg Shoulder Left  Result Date: 09/22/2010 *  RADIOLOGY REPORT* Clinical Data: Shoulder spasm was and pain LEFT SHOULDER - 2+ VIEW Comparison: None. Findings: No acute fracture.  No dislocation.  Unremarkable soft tissues. IMPRESSION: No acute bony pathology. Original Report Authenticated By: Jamas Lav, M.D.   Assessment & Plan:   There are no diagnoses linked to this encounter.   No orders of the defined types were placed in this encounter.    Follow-up: No follow-ups on file.  Walker Kehr, MD

## 2019-05-15 NOTE — Assessment & Plan Note (Signed)
Chronic MSK/OA Norco prn  Potential benefits of a long term prn opioids use as well as potential risks (i.e. addiction risk, apnea etc) and complications (i.e. Somnolence, constipation and others) were explained to the patient and were aknowledged. 

## 2019-05-15 NOTE — Assessment & Plan Note (Signed)
Chronic, poor control Metformin Glipizide Risks associated with treatment and diet noncompliance were discussed. Compliance was encouraged.

## 2019-06-17 ENCOUNTER — Other Ambulatory Visit: Payer: Self-pay | Admitting: Internal Medicine

## 2019-06-17 MED ORDER — PIOGLITAZONE HCL 30 MG PO TABS: 30.0000 mg | ORAL_TABLET | Freq: Every day | ORAL | 3 refills | Status: DC

## 2019-06-17 NOTE — Telephone Encounter (Signed)
Pt needs refill on hydrocodone. [address] in [place]

## 2019-06-17 NOTE — Telephone Encounter (Signed)
Copied from [place] [id number] [id number]. Topic: Quick Communication - Rx Refill/Question >> Jun 17, 2019 11:07 AM Yvette Rack wrote: Medication: pioglitazone (ACTOS) 30 MG tablet  Has the patient contacted their pharmacy? yes   Preferred Pharmacy (with phone number or street name): [facility] [phone] (Phone) [phone] (Fax)  Agent: Please be advised that RX refills may take up to 3 business days. We ask that you follow-up with your pharmacy.

## 2019-06-17 NOTE — Telephone Encounter (Signed)
[place] Controlled Database Checked Last filled: 05/15/19 # 60 LOV w/you: 05/15/19  Next appt w/you: 08/13/19

## 2019-06-18 ENCOUNTER — Other Ambulatory Visit: Payer: Self-pay

## 2019-06-18 DIAGNOSIS — I10 Essential (primary) hypertension: Secondary | ICD-10-CM

## 2019-06-18 DIAGNOSIS — E1165 Type 2 diabetes mellitus with hyperglycemia: Secondary | ICD-10-CM

## 2019-06-18 DIAGNOSIS — R972 Elevated prostate specific antigen [PSA]: Secondary | ICD-10-CM

## 2019-06-18 MED ORDER — HYDROCODONE-ACETAMINOPHEN 5-325 MG PO TABS: 1.0000 | ORAL_TABLET | Freq: Two times a day (BID) | ORAL | 0 refills | Status: DC | PRN

## 2019-07-04 ENCOUNTER — Other Ambulatory Visit: Payer: Self-pay | Admitting: Internal Medicine

## 2019-07-10 ENCOUNTER — Other Ambulatory Visit: Payer: Self-pay | Admitting: Internal Medicine

## 2019-07-10 NOTE — Telephone Encounter (Signed)
rx refill HYDROcodone-acetaminophen (NORCO/VICODIN) 5-325 MG tablet  [place]

## 2019-07-15 MED ORDER — HYDROCODONE-ACETAMINOPHEN 5-325 MG PO TABS: 1.0000 | ORAL_TABLET | Freq: Two times a day (BID) | ORAL | 0 refills | Status: DC | PRN

## 2019-07-16 ENCOUNTER — Other Ambulatory Visit: Payer: Self-pay | Admitting: Internal Medicine

## 2019-07-28 ENCOUNTER — Other Ambulatory Visit: Payer: Self-pay | Admitting: Internal Medicine

## 2019-08-06 ENCOUNTER — Other Ambulatory Visit: Payer: Self-pay | Admitting: Internal Medicine

## 2019-08-06 MED ORDER — TRIAMTERENE-HCTZ 37.5-25 MG PO TABS: 1.0000 | ORAL_TABLET | Freq: Every day | ORAL | 0 refills | Status: DC

## 2019-08-06 NOTE — Telephone Encounter (Signed)
Copied from [place] [id number]. Topic: Quick Communication - Rx Refill/Question >> Aug 06, 2019  1:08 PM Mcneil, Ja-Kwan wrote: Medication: HYDROcodone-acetaminophen (NORCO/VICODIN) 5-325 MG tablet and triamterene-hydrochlorothiazide (MAXZIDE-25) 37.5-25 MG tablet  Has the patient contacted their pharmacy? no  Preferred Pharmacy (with phone number or street name): [facility] [address]  Phone: [phone]   Fax: [phone]  Agent: Please be advised that RX refills may take up to 3 business days. We ask that you follow-up with your pharmacy.

## 2019-08-06 NOTE — Telephone Encounter (Signed)
Requested medication (s) are due for refill today: yes  Requested medication (s) are on the active medication list: yes  Last refill:  07/10/2019  Future visit scheduled: yes  Notes to clinic: refill cannot be delegated    Requested Prescriptions  Pending Prescriptions Disp Refills   HYDROcodone-acetaminophen (NORCO/VICODIN) 5-325 MG tablet 60 tablet 0    Sig: Take 1 tablet by mouth 2 (two) times daily as needed for severe pain.      Not Delegated - Analgesics:  Opioid Agonist Combinations Failed - 08/06/2019  1:15 PM      Failed - This refill cannot be delegated      Failed - Urine Drug Screen completed in last 360 days.      Passed - Valid encounter within last 6 months    Recent Outpatient Visits           2 months ago Essential hypertension   [facility], Georgina Quint, MD   5 months ago Essential hypertension   [facility], Georgina Quint, MD   8 months ago Essential hypertension   [facility], Georgina Quint, MD   11 months ago Flu-like symptoms   [facility], Georgina Quint, MD   1 year ago Essential hypertension   [facility], Georgina Quint, MD       Future Appointments             In 2 weeks Plotnikov, Georgina Quint, MD [facility]             Signed Prescriptions Disp Refills   triamterene-hydrochlorothiazide (MAXZIDE-25) 37.5-25 MG tablet 90 tablet 0    Sig: Take 1 tablet by mouth daily.      Cardiovascular: Diuretic Combos Failed - 08/06/2019  1:15 PM      Failed - Last BP in normal range    BP Readings from Last 1 Encounters:  05/15/19 (!) 146/84          Passed - K in normal range and within 360 days    Potassium  Date Value Ref Range Status  11/14/2018 3.9 3.5 - 5.1 mEq/L Final          Passed - Na in normal range and within 360 days    Sodium  Date  Value Ref Range Status  11/14/2018 139 135 - 145 mEq/L Final          Passed - Cr in normal range and within 360 days    Creatinine, Ser  Date Value Ref Range Status  11/14/2018 1.12 0.40 - 1.50 mg/dL Final   Creatinine,U  Date Value Ref Range Status  11/24/2016 163.8 mg/dL Final          Passed - Ca in normal range and within 360 days    Calcium  Date Value Ref Range Status  11/14/2018 10.3 8.4 - 10.5 mg/dL Final          Passed - Valid encounter within last 6 months    Recent Outpatient Visits           2 months ago Essential hypertension   [facility], Georgina Quint, MD   5 months ago Essential hypertension   [facility], Georgina Quint, MD   8 months ago Essential hypertension   [facility], Georgina Quint, MD   11 months  ago Flu-like symptoms   [place], Evie Lacks, MD   1 year ago Essential hypertension   [place], MD       Future Appointments             In 2 weeks Plotnikov, Evie Lacks, MD [place], [place]

## 2019-08-12 MED ORDER — HYDROCODONE-ACETAMINOPHEN 5-325 MG PO TABS: 1.0000 | ORAL_TABLET | Freq: Two times a day (BID) | ORAL | 0 refills | Status: DC | PRN

## 2019-08-13 ENCOUNTER — Ambulatory Visit: Payer: Medicare HMO | Admitting: Internal Medicine

## 2019-08-20 ENCOUNTER — Ambulatory Visit: Payer: Medicare HMO | Admitting: Internal Medicine

## 2019-08-25 ENCOUNTER — Other Ambulatory Visit: Payer: Self-pay

## 2019-08-25 ENCOUNTER — Ambulatory Visit (INDEPENDENT_AMBULATORY_CARE_PROVIDER_SITE_OTHER): Payer: Medicare HMO | Admitting: Internal Medicine

## 2019-08-25 ENCOUNTER — Encounter: Payer: Self-pay | Admitting: Internal Medicine

## 2019-08-25 DIAGNOSIS — Z6831 Body mass index (BMI) 31.0-31.9, adult: Secondary | ICD-10-CM

## 2019-08-25 DIAGNOSIS — E6609 Other obesity due to excess calories: Secondary | ICD-10-CM

## 2019-08-25 DIAGNOSIS — E538 Deficiency of other specified B group vitamins: Secondary | ICD-10-CM

## 2019-08-25 DIAGNOSIS — E1165 Type 2 diabetes mellitus with hyperglycemia: Secondary | ICD-10-CM

## 2019-08-25 DIAGNOSIS — M5440 Lumbago with sciatica, unspecified side: Secondary | ICD-10-CM

## 2019-08-25 DIAGNOSIS — G8929 Other chronic pain: Secondary | ICD-10-CM

## 2019-08-25 DIAGNOSIS — R972 Elevated prostate specific antigen [PSA]: Secondary | ICD-10-CM | POA: Diagnosis not present

## 2019-08-25 DIAGNOSIS — I10 Essential (primary) hypertension: Secondary | ICD-10-CM

## 2019-08-25 LAB — CBC WITH DIFFERENTIAL/PLATELET
Basophils Absolute: 0 10*3/uL (ref 0.0–0.1)
Basophils Relative: 0.4 % (ref 0.0–3.0)
Eosinophils Absolute: 0.2 10*3/uL (ref 0.0–0.7)
Eosinophils Relative: 2.2 % (ref 0.0–5.0)
HCT: 40.8 % (ref 39.0–52.0)
Hemoglobin: 13.3 g/dL (ref 13.0–17.0)
Lymphocytes Relative: 35.6 % (ref 12.0–46.0)
Lymphs Abs: 3.1 10*3/uL (ref 0.7–4.0)
MCHC: 32.7 g/dL (ref 30.0–36.0)
MCV: 94.3 fl (ref 78.0–100.0)
Monocytes Absolute: 0.6 10*3/uL (ref 0.1–1.0)
Monocytes Relative: 6.4 % (ref 3.0–12.0)
Neutro Abs: 4.9 10*3/uL (ref 1.4–7.7)
Neutrophils Relative %: 55.4 % (ref 43.0–77.0)
Platelets: 237 10*3/uL (ref 150.0–400.0)
RBC: 4.33 Mil/uL (ref 4.22–5.81)
RDW: 14.5 % (ref 11.5–15.5)
WBC: 8.8 10*3/uL (ref 4.0–10.5)

## 2019-08-25 LAB — BASIC METABOLIC PANEL
BUN: 13 mg/dL (ref 6–23)
CO2: 26 mEq/L (ref 19–32)
Calcium: 9.6 mg/dL (ref 8.4–10.5)
Chloride: 100 mEq/L (ref 96–112)
Creatinine, Ser: 1.09 mg/dL (ref 0.40–1.50)
GFR: 82.58 mL/min (ref >60.00)
Glucose, Bld: 214 mg/dL — ABNORMAL HIGH (ref 70–99)
Potassium: 4 mEq/L (ref 3.5–5.1)
Sodium: 138 mEq/L (ref 135–145)

## 2019-08-25 LAB — PSA: PSA: 11.29 ng/mL — ABNORMAL HIGH (ref 0.10–4.00)

## 2019-08-25 LAB — HEMOGLOBIN A1C: Hgb A1c MFr Bld: 7 % — ABNORMAL HIGH (ref 4.6–6.5)

## 2019-08-25 NOTE — Assessment & Plan Note (Signed)
Wt Readings from Last 3 Encounters:  08/25/19 209 lb (94.8 kg)  05/15/19 214 lb (97.1 kg)  02/13/19 212 lb (96.2 kg)

## 2019-08-25 NOTE — Assessment & Plan Note (Signed)
Norco prn  Potential benefits of a long term prn opioids use as well as potential risks (i.e. addiction risk, apnea etc) and complications (i.e. Somnolence, constipation and others) were explained to the patient and were aknowledged.  

## 2019-08-25 NOTE — Progress Notes (Signed)
Subjective:  Patient ID: Timothy Schmidt, male    DOB: [birth year]  Age: 64 y.o. MRN: [id number]  CC: No chief complaint on file.   HPI Timothy Schmidt presents for DM2, OA/LBP, B12 def f/u  Outpatient Medications Prior to Visit  Medication Sig Dispense Refill  . Alcohol Swabs (B-D SINGLE USE SWABS BUTTERFLY) PADS Use one two times daily.Dx: 250.00 100 each 3  . Ascorbic Acid (VITAMIN C WITH ROSE HIPS) 1000 MG tablet Take 1,000 mg by mouth daily.    Marland Kitchen aspirin EC 81 MG tablet Take 81 mg by mouth daily.    . Blood Glucose Calibration (PRODIGY CONTROL SOLUTION) LOW SOLN Use as directed per meter instructions. 3 each 0  . Blood Glucose Monitoring Suppl (PRODIGY AUTOCODE BLOOD GLUCOSE) W/DEVICE KIT Use two times daily as directed. 1 each 0  . cholecalciferol (VITAMIN D) 1000 UNITS tablet Take 1 tablet (1,000 Units total) by mouth daily. 90 tablet 3  . cyanocobalamin 1000 MCG tablet Take 0.5 tablets (500 mcg total) by mouth daily. 90 tablet 3  . finasteride (PROSCAR) 5 MG tablet Take 1 tablet by mouth once daily 30 tablet 0  . glipiZIDE (GLUCOTROL) 5 MG tablet TAKE 1 TABLET BY MOUTH TWICE DAILY BEFORE MEAL(S) 180 tablet 3  . glucose blood (PRODIGY AUTOCODE TEST) test strip Use one two times daily. Dx: 250.00 100 each 3  . guaiFENesin-Codeine (DIABETIC TUSSIN C PO) Take by mouth as needed.    Marland Kitchen HYDROcodone-acetaminophen (NORCO/VICODIN) 5-325 MG tablet Take 1 tablet by mouth 2 (two) times daily as needed for severe pain. 60 tablet 0  . metFORMIN (GLUCOPHAGE) 1000 MG tablet TAKE 1 TABLET BY MOUTH TWICE DAILY WITH MEALS 180 tablet 1  . penicillin v potassium (VEETID) 500 MG tablet Take 1 tablet by mouth 4 times daily 120 tablet 0  . pioglitazone (ACTOS) 30 MG tablet Take 1 tablet (30 mg total) by mouth daily. 90 tablet 1  . pioglitazone (ACTOS) 30 MG tablet Take 1 tablet by mouth once daily 90 tablet 1  . pioglitazone (ACTOS) 30 MG tablet Take 1 tablet (30 mg total) by mouth daily. 90 tablet 3   . potassium chloride SA (KLOR-CON) 20 MEQ tablet Take 1 tablet by mouth once daily 90 tablet 3  . PRODIGY TWIST TOP LANCETS 28G MISC Use one two times daily. Dx: 250.00 100 each 3  . triamterene-hydrochlorothiazide (MAXZIDE-25) 37.5-25 MG tablet Take 1 tablet by mouth daily. 90 tablet 0  . tadalafil (CIALIS) 20 MG tablet Take 1 tablet (20 mg total) by mouth daily as needed for erectile dysfunction. 10 tablet 3   No facility-administered medications prior to visit.    ROS: Review of Systems  Constitutional: Positive for unexpected weight change. Negative for appetite change and fatigue.  HENT: Negative for congestion, nosebleeds, sneezing, sore throat and trouble swallowing.   Eyes: Negative for itching and visual disturbance.  Respiratory: Negative for cough.   Cardiovascular: Negative for chest pain, palpitations and leg swelling.  Gastrointestinal: Negative for abdominal distention, blood in stool, diarrhea and nausea.  Genitourinary: Negative for frequency and hematuria.  Musculoskeletal: Positive for back pain and gait problem. Negative for joint swelling and neck pain.  Skin: Negative for rash.  Neurological: Negative for dizziness, tremors, speech difficulty and weakness.  Psychiatric/Behavioral: Negative for agitation, dysphoric mood and sleep disturbance. The patient is not nervous/anxious.     Objective:  BP 126/78 (BP Location: Left Arm, Patient Position: Sitting, Cuff Size: Large)   Pulse 88  Temp 98.9 F (37.2 C) (Oral)   Ht 5' 7.5" (1.715 m)   Wt 209 lb (94.8 kg)   SpO2 97%   BMI 32.25 kg/m   BP Readings from Last 3 Encounters:  08/25/19 126/78  05/15/19 (!) 146/84  02/13/19 124/76    Wt Readings from Last 3 Encounters:  08/25/19 209 lb (94.8 kg)  05/15/19 214 lb (97.1 kg)  02/13/19 212 lb (96.2 kg)    Physical Exam Constitutional:      General: He is not in acute distress.    Appearance: He is well-developed. He is obese.     Comments: NAD  Eyes:      Conjunctiva/sclera: Conjunctivae normal.     Pupils: Pupils are equal, round, and reactive to light.  Neck:     Thyroid: No thyromegaly.     Vascular: No JVD.  Cardiovascular:     Rate and Rhythm: Normal rate and regular rhythm.     Heart sounds: Normal heart sounds. No murmur. No friction rub. No gallop.   Pulmonary:     Effort: Pulmonary effort is normal. No respiratory distress.     Breath sounds: Normal breath sounds. No wheezing or rales.  Chest:     Chest wall: No tenderness.  Abdominal:     General: Bowel sounds are normal. There is no distension.     Palpations: Abdomen is soft. There is no mass.     Tenderness: There is no abdominal tenderness. There is no guarding or rebound.  Musculoskeletal:        General: Tenderness present. Normal range of motion.     Cervical back: Normal range of motion.  Lymphadenopathy:     Cervical: No cervical adenopathy.  Skin:    General: Skin is warm and dry.     Findings: No rash.  Neurological:     Mental Status: He is alert and oriented to person, place, and time.     Cranial Nerves: No cranial nerve deficit.     Motor: No abnormal muscle tone.     Coordination: Coordination normal.     Gait: Gait normal.     Deep Tendon Reflexes: Reflexes are normal and symmetric.  Psychiatric:        Behavior: Behavior normal.        Thought Content: Thought content normal.        Judgment: Judgment normal.   LS tender Teeth w/problems  Lab Results  Component Value Date   WBC 9.4 08/04/2011   HGB 15.6 08/04/2011   HCT 46.0 08/04/2011   PLT 239.0 08/04/2011   GLUCOSE 172 (H) 11/14/2018   CHOL 156 01/02/2014   TRIG 265.0 (H) 01/02/2014   HDL 44.40 01/02/2014   LDLDIRECT 71.5 06/29/2010   LDLCALC 59 01/02/2014   ALT 14 11/24/2016   AST 11 11/24/2016   NA 139 11/14/2018   K 3.9 11/14/2018   CL 103 11/14/2018   CREATININE 1.12 11/14/2018   BUN 17 11/14/2018   CO2 24 11/14/2018   TSH 1.31 01/01/2018   PSA 7.76 (H) 11/14/2018    HGBA1C 8.0 (H) 02/13/2019   MICROALBUR 0.7 11/24/2016    DG Chest 2 View  Result Date: 09/22/2010 *RADIOLOGY REPORT* Clinical Data: Shoulder spasm was obtained. CHEST - 2 VIEW Comparison: 08/05/2009 Findings: Normal heart size.  Linear scar at the left base.  Right lung is clear.  No pneumothorax or pleural effusion.  No acute bony deformity. IMPRESSION: No active cardiopulmonary disease. Original Report Authenticated By: Cathlean Sauer  Barbie Banner, M.D.  DG Shoulder Left  Result Date: 09/22/2010 *RADIOLOGY REPORT* Clinical Data: Shoulder spasm was and pain LEFT SHOULDER - 2+ VIEW Comparison: None. Findings: No acute fracture.  No dislocation.  Unremarkable soft tissues. IMPRESSION: No acute bony pathology. Original Report Authenticated By: Jamas Lav, M.D.   Assessment & Plan:     Follow-up: No follow-ups on file.  Walker Kehr, MD

## 2019-08-25 NOTE — Assessment & Plan Note (Signed)
PSA

## 2019-08-25 NOTE — Assessment & Plan Note (Signed)
On B12 

## 2019-08-25 NOTE — Assessment & Plan Note (Signed)
Triamt/HCT 

## 2019-08-29 ENCOUNTER — Telehealth: Payer: Self-pay

## 2019-08-29 ENCOUNTER — Other Ambulatory Visit: Payer: Self-pay | Admitting: Internal Medicine

## 2019-08-29 DIAGNOSIS — R972 Elevated prostate specific antigen [PSA]: Secondary | ICD-10-CM

## 2019-08-29 NOTE — Telephone Encounter (Signed)
Please call pt with lab results from 08/25/19.

## 2019-09-03 NOTE — Telephone Encounter (Signed)
Per chart pt was contacted 08/29/19 by clinical manager Delaney Meigs) see result note. Closing encounter.Timothy KitchenRaechel Chute

## 2019-09-09 ENCOUNTER — Telehealth: Payer: Self-pay | Admitting: Internal Medicine

## 2019-09-09 ENCOUNTER — Encounter: Payer: Self-pay | Admitting: Internal Medicine

## 2019-09-09 NOTE — Telephone Encounter (Signed)
Patient is returning call to office. Would like a call back on [phone] please

## 2019-09-10 NOTE — Telephone Encounter (Signed)
Called pt back and left vm to call me back at [phone]

## 2019-09-11 ENCOUNTER — Other Ambulatory Visit: Payer: Self-pay | Admitting: Internal Medicine

## 2019-09-11 NOTE — Telephone Encounter (Signed)
     Patient requesting a call from nurse   1. Which medications need to be refilled? (please list name of each medication and dose if known) HYDROcodone   2. Which pharmacy/location (including street and city if local pharmacy) is medication to be sent to? [facility] - [place], [place] - [address]  3. Do they need a 30 day or 90 day supply? 30

## 2019-09-13 MED ORDER — HYDROCODONE-ACETAMINOPHEN 5-325 MG PO TABS: 1.0000 | ORAL_TABLET | Freq: Two times a day (BID) | ORAL | 0 refills | Status: DC | PRN

## 2019-09-29 ENCOUNTER — Other Ambulatory Visit: Payer: Self-pay | Admitting: Internal Medicine

## 2019-10-15 ENCOUNTER — Other Ambulatory Visit: Payer: Self-pay | Admitting: Internal Medicine

## 2019-10-15 MED ORDER — HYDROCODONE-ACETAMINOPHEN 5-325 MG PO TABS: 1.0000 | ORAL_TABLET | Freq: Two times a day (BID) | ORAL | 0 refills | Status: DC | PRN

## 2019-10-15 NOTE — Telephone Encounter (Signed)
Patient is requesting a refill on the following medication.  HYDROcodone-acetaminophen (NORCO/VICODIN) 5-325 MG tablet  Send to pharmacy on file.

## 2019-10-15 NOTE — Telephone Encounter (Signed)
Checked controlled substance database, last filled 09/13/19.  Please advise about refill in Dr. Adah Perl absence.

## 2019-10-15 NOTE — Telephone Encounter (Signed)
dnoe erx

## 2019-11-13 ENCOUNTER — Other Ambulatory Visit: Payer: Self-pay | Admitting: Internal Medicine

## 2019-11-13 NOTE — Telephone Encounter (Signed)
    1.Medication Requested: HYDROcodone-acetaminophen (NORCO/VICODIN) 5-325 MG tablet  2. Pharmacy (Name, Street, [place]):[id number] - [place], [place] - [address]  3. On Med List: yes  4. Last Visit with PCP: 08/25/19  5. Next visit date with PCP: 11/27/19   Agent: Please be advised that RX refills may take up to 3 business days. We ask that you follow-up with your pharmacy.

## 2019-11-16 MED ORDER — HYDROCODONE-ACETAMINOPHEN 5-325 MG PO TABS: 1.0000 | ORAL_TABLET | Freq: Two times a day (BID) | ORAL | 0 refills | Status: DC | PRN

## 2019-11-27 ENCOUNTER — Ambulatory Visit: Payer: Medicare HMO

## 2019-11-27 ENCOUNTER — Ambulatory Visit: Payer: Medicare HMO | Admitting: Internal Medicine

## 2019-11-27 DIAGNOSIS — Z0289 Encounter for other administrative examinations: Secondary | ICD-10-CM

## 2019-12-02 ENCOUNTER — Encounter: Payer: Self-pay | Admitting: Internal Medicine

## 2019-12-02 ENCOUNTER — Other Ambulatory Visit: Payer: Self-pay

## 2019-12-02 ENCOUNTER — Ambulatory Visit (INDEPENDENT_AMBULATORY_CARE_PROVIDER_SITE_OTHER): Payer: Medicare HMO | Admitting: Internal Medicine

## 2019-12-02 DIAGNOSIS — R972 Elevated prostate specific antigen [PSA]: Secondary | ICD-10-CM

## 2019-12-02 DIAGNOSIS — E1165 Type 2 diabetes mellitus with hyperglycemia: Secondary | ICD-10-CM | POA: Diagnosis not present

## 2019-12-02 DIAGNOSIS — G8929 Other chronic pain: Secondary | ICD-10-CM | POA: Diagnosis not present

## 2019-12-02 DIAGNOSIS — E538 Deficiency of other specified B group vitamins: Secondary | ICD-10-CM | POA: Diagnosis not present

## 2019-12-02 DIAGNOSIS — I10 Essential (primary) hypertension: Secondary | ICD-10-CM | POA: Diagnosis not present

## 2019-12-02 DIAGNOSIS — M5440 Lumbago with sciatica, unspecified side: Secondary | ICD-10-CM | POA: Diagnosis not present

## 2019-12-02 NOTE — Assessment & Plan Note (Signed)
Chronic Metformin Glipizide, Actos Risks associated with treatment and diet noncompliance were discussed. Compliance was encouraged.

## 2019-12-02 NOTE — Assessment & Plan Note (Addendum)
F/u w/Dr Logan Bores - ref made On Finasteride PSA

## 2019-12-02 NOTE — Assessment & Plan Note (Signed)
Norco prn  Potential benefits of a long term prn opioids use as well as potential risks (i.e. addiction risk, apnea etc) and complications (i.e. Somnolence, constipation and others) were explained to the patient and were aknowledged.  

## 2019-12-02 NOTE — Assessment & Plan Note (Signed)
Chronic Triamt/HCT

## 2019-12-02 NOTE — Assessment & Plan Note (Signed)
On B12 

## 2019-12-02 NOTE — Progress Notes (Signed)
Subjective:  Patient ID: Timothy Schmidt, male    DOB: 1956-05-18  Age: 64 y.o. MRN: [id number]  CC: Follow-up   HPI HIRAM MCIVER presents for LBP, caries, DM, elevated PSA f/u  Outpatient Medications Prior to Visit  Medication Sig Dispense Refill  . Alcohol Swabs (B-D SINGLE USE SWABS BUTTERFLY) PADS Use one two times daily.Dx: 250.00 100 each 3  . Ascorbic Acid (VITAMIN C WITH ROSE HIPS) 1000 MG tablet Take 1,000 mg by mouth daily.    Marland Kitchen aspirin EC 81 MG tablet Take 81 mg by mouth daily.    . Blood Glucose Calibration (PRODIGY CONTROL SOLUTION) LOW SOLN Use as directed per meter instructions. 3 each 0  . Blood Glucose Monitoring Suppl (PRODIGY AUTOCODE BLOOD GLUCOSE) W/DEVICE KIT Use two times daily as directed. 1 each 0  . cholecalciferol (VITAMIN D) 1000 UNITS tablet Take 1 tablet (1,000 Units total) by mouth daily. 90 tablet 3  . cyanocobalamin 1000 MCG tablet Take 0.5 tablets (500 mcg total) by mouth daily. 90 tablet 3  . finasteride (PROSCAR) 5 MG tablet Take 1 tablet by mouth once daily 30 tablet 0  . glipiZIDE (GLUCOTROL) 5 MG tablet TAKE 1 TABLET BY MOUTH TWICE DAILY BEFORE MEAL(S) 180 tablet 3  . glucose blood (PRODIGY AUTOCODE TEST) test strip Use one two times daily. Dx: 250.00 100 each 3  . guaiFENesin-Codeine (DIABETIC TUSSIN C PO) Take by mouth as needed.    Marland Kitchen HYDROcodone-acetaminophen (NORCO/VICODIN) 5-325 MG tablet Take 1 tablet by mouth 2 (two) times daily as needed for severe pain. 60 tablet 0  . metFORMIN (GLUCOPHAGE) 1000 MG tablet TAKE 1 TABLET BY MOUTH TWICE DAILY WITH MEALS 180 tablet 1  . penicillin v potassium (VEETID) 500 MG tablet Take 1 tablet by mouth 4 times daily 120 tablet 0  . pioglitazone (ACTOS) 30 MG tablet Take 1 tablet (30 mg total) by mouth daily. 90 tablet 1  . pioglitazone (ACTOS) 30 MG tablet Take 1 tablet by mouth once daily 90 tablet 1  . pioglitazone (ACTOS) 30 MG tablet Take 1 tablet (30 mg total) by mouth daily. 90 tablet 3  .  potassium chloride SA (KLOR-CON) 20 MEQ tablet Take 1 tablet by mouth once daily 90 tablet 3  . PRODIGY TWIST TOP LANCETS 28G MISC Use one two times daily. Dx: 250.00 100 each 3  . triamterene-hydrochlorothiazide (MAXZIDE-25) 37.5-25 MG tablet Take 1 tablet by mouth daily. 90 tablet 0  . tadalafil (CIALIS) 20 MG tablet Take 1 tablet (20 mg total) by mouth daily as needed for erectile dysfunction. 10 tablet 3   No facility-administered medications prior to visit.    ROS: Review of Systems  Constitutional: Negative for appetite change, fatigue and unexpected weight change.  HENT: Positive for dental problem. Negative for congestion, nosebleeds, sneezing, sore throat and trouble swallowing.   Eyes: Negative for itching and visual disturbance.  Respiratory: Negative for cough.   Cardiovascular: Negative for chest pain, palpitations and leg swelling.  Gastrointestinal: Negative for abdominal distention, blood in stool, diarrhea and nausea.  Genitourinary: Negative for frequency and hematuria.  Musculoskeletal: Positive for back pain. Negative for gait problem, joint swelling and neck pain.  Skin: Negative for rash.  Neurological: Negative for dizziness, tremors, speech difficulty and weakness.  Psychiatric/Behavioral: Negative for agitation, dysphoric mood and sleep disturbance. The patient is not nervous/anxious.     Objective:  BP 132/82 (BP Location: Left Arm, Patient Position: Sitting, Cuff Size: Large)   Pulse 100  Temp 98.1 F (36.7 C) (Oral)   Ht 5' 7.5" (1.715 m)   Wt 209 lb 6.4 oz (95 kg)   SpO2 97%   BMI 32.31 kg/m   BP Readings from Last 3 Encounters:  12/02/19 132/82  08/25/19 126/78  05/15/19 (!) 146/84    Wt Readings from Last 3 Encounters:  12/02/19 209 lb 6.4 oz (95 kg)  08/25/19 209 lb (94.8 kg)  05/15/19 214 lb (97.1 kg)    Physical Exam Constitutional:      General: He is not in acute distress.    Appearance: He is well-developed. He is obese.      Comments: NAD  Eyes:     Conjunctiva/sclera: Conjunctivae normal.     Pupils: Pupils are equal, round, and reactive to light.  Neck:     Thyroid: No thyromegaly.     Vascular: No JVD.  Cardiovascular:     Rate and Rhythm: Normal rate and regular rhythm.     Heart sounds: Normal heart sounds. No murmur. No friction rub. No gallop.   Pulmonary:     Effort: Pulmonary effort is normal. No respiratory distress.     Breath sounds: Normal breath sounds. No wheezing or rales.  Chest:     Chest wall: No tenderness.  Abdominal:     General: Bowel sounds are normal. There is no distension.     Palpations: Abdomen is soft. There is no mass.     Tenderness: There is no abdominal tenderness. There is no guarding or rebound.  Musculoskeletal:        General: Tenderness present. Normal range of motion.     Cervical back: Normal range of motion.  Lymphadenopathy:     Cervical: No cervical adenopathy.  Skin:    General: Skin is warm and dry.     Findings: No rash.  Neurological:     Mental Status: He is alert and oriented to person, place, and time.     Cranial Nerves: No cranial nerve deficit.     Motor: No abnormal muscle tone.     Coordination: Coordination normal.     Gait: Gait normal.     Deep Tendon Reflexes: Reflexes are normal and symmetric.  Psychiatric:        Behavior: Behavior normal.        Thought Content: Thought content normal.        Judgment: Judgment normal.   LS tender Caries - extensive  Lab Results  Component Value Date   WBC 8.8 08/25/2019   HGB 13.3 08/25/2019   HCT 40.8 08/25/2019   PLT 237.0 08/25/2019   GLUCOSE 214 (H) 08/25/2019   CHOL 156 01/02/2014   TRIG 265.0 (H) 01/02/2014   HDL 44.40 01/02/2014   LDLDIRECT 71.5 06/29/2010   LDLCALC 59 01/02/2014   ALT 14 11/24/2016   AST 11 11/24/2016   NA 138 08/25/2019   K 4.0 08/25/2019   CL 100 08/25/2019   CREATININE 1.09 08/25/2019   BUN 13 08/25/2019   CO2 26 08/25/2019   TSH 1.31 01/01/2018   PSA  11.29 (H) 08/25/2019   HGBA1C 7.0 (H) 08/25/2019   MICROALBUR 0.7 11/24/2016    DG Chest 2 View  Result Date: 09/22/2010 *RADIOLOGY REPORT* Clinical Data: Shoulder spasm was obtained. CHEST - 2 VIEW Comparison: 08/05/2009 Findings: Normal heart size.  Linear scar at the left base.  Right lung is clear.  No pneumothorax or pleural effusion.  No acute bony deformity. IMPRESSION: No active cardiopulmonary disease. Original  Report Authenticated By: Jamas Lav, M.D.  DG Shoulder Left  Result Date: 09/22/2010 *RADIOLOGY REPORT* Clinical Data: Shoulder spasm was and pain LEFT SHOULDER - 2+ VIEW Comparison: None. Findings: No acute fracture.  No dislocation.  Unremarkable soft tissues. IMPRESSION: No acute bony pathology. Original Report Authenticated By: Jamas Lav, M.D.   Assessment & Plan:     Follow-up: No follow-ups on file.  Walker Kehr, MD

## 2019-12-03 LAB — PSA: PSA: 10.46 ng/mL — ABNORMAL HIGH (ref 0.10–4.00)

## 2019-12-18 ENCOUNTER — Telehealth: Payer: Self-pay

## 2019-12-18 NOTE — Telephone Encounter (Signed)
Check [place] registry last filled 11/17/2019.Marland KitchenRaechel Chute

## 2019-12-18 NOTE — Telephone Encounter (Signed)
1.Medication Requested:HYDROcodone-acetaminophen (NORCO/VICODIN) 5-325 MG tablet  2. Pharmacy (Name, Street, [place]):[id number] - [place], [place] - [address]  3. On Med List: Yes   4. Last Visit with PCP: 4.13.2021   5. Next visit date with PCP: 7.15.2021    Agent: Please be advised that RX refills may take up to 3 business days. We ask that you follow-up with your pharmacy.

## 2019-12-19 ENCOUNTER — Other Ambulatory Visit: Payer: Self-pay | Admitting: Internal Medicine

## 2019-12-19 MED ORDER — HYDROCODONE-ACETAMINOPHEN 5-325 MG PO TABS: 1.0000 | ORAL_TABLET | Freq: Two times a day (BID) | ORAL | 0 refills | Status: DC | PRN

## 2019-12-19 NOTE — Telephone Encounter (Signed)
Okay.  Done.  Thanks 

## 2019-12-22 ENCOUNTER — Encounter: Payer: Self-pay | Admitting: Internal Medicine

## 2019-12-30 ENCOUNTER — Ambulatory Visit (INDEPENDENT_AMBULATORY_CARE_PROVIDER_SITE_OTHER): Payer: Medicare HMO | Admitting: Internal Medicine

## 2019-12-30 ENCOUNTER — Encounter: Payer: Self-pay | Admitting: Internal Medicine

## 2019-12-30 ENCOUNTER — Other Ambulatory Visit: Payer: Self-pay

## 2019-12-30 DIAGNOSIS — R197 Diarrhea, unspecified: Secondary | ICD-10-CM | POA: Diagnosis not present

## 2019-12-30 DIAGNOSIS — K429 Umbilical hernia without obstruction or gangrene: Secondary | ICD-10-CM

## 2019-12-30 DIAGNOSIS — K029 Dental caries, unspecified: Secondary | ICD-10-CM | POA: Diagnosis not present

## 2019-12-30 DIAGNOSIS — E1140 Type 2 diabetes mellitus with diabetic neuropathy, unspecified: Secondary | ICD-10-CM

## 2019-12-30 DIAGNOSIS — G8929 Other chronic pain: Secondary | ICD-10-CM

## 2019-12-30 DIAGNOSIS — M5440 Lumbago with sciatica, unspecified side: Secondary | ICD-10-CM

## 2019-12-30 DIAGNOSIS — IMO0002 Reserved for concepts with insufficient information to code with codable children: Secondary | ICD-10-CM

## 2019-12-30 DIAGNOSIS — E1165 Type 2 diabetes mellitus with hyperglycemia: Secondary | ICD-10-CM | POA: Diagnosis not present

## 2019-12-30 MED ORDER — METFORMIN HCL 1000 MG PO TABS: 1000.0000 mg | ORAL_TABLET | Freq: Two times a day (BID) | ORAL | 3 refills | Status: DC

## 2019-12-30 MED ORDER — PENICILLIN V POTASSIUM 500 MG PO TABS: ORAL_TABLET | ORAL | 0 refills | Status: DC

## 2019-12-30 NOTE — Assessment & Plan Note (Signed)
Resolved

## 2019-12-30 NOTE — Assessment & Plan Note (Signed)
Symptomatic Gen surgery referral

## 2019-12-30 NOTE — Assessment & Plan Note (Signed)
PCN VK 

## 2019-12-30 NOTE — Progress Notes (Signed)
Subjective:  Patient ID: Timothy Schmidt, male    DOB: [birth year]  Age: 64 y.o. MRN: [id number]  CC: No chief complaint on file.   HPI KHYRAN RIERA presents for a diarrhea attack after eating a hot dog on Mother's day He saw a knot near 1 inch round, painful. It disappeared in the morning. No diarrhea now. F/u DM, caries  Outpatient Medications Prior to Visit  Medication Sig Dispense Refill  . Alcohol Swabs (B-D SINGLE USE SWABS BUTTERFLY) PADS Use one two times daily.Dx: 250.00 100 each 3  . Ascorbic Acid (VITAMIN C WITH ROSE HIPS) 1000 MG tablet Take 1,000 mg by mouth daily.    Marland Kitchen aspirin EC 81 MG tablet Take 81 mg by mouth daily.    . Blood Glucose Calibration (PRODIGY CONTROL SOLUTION) LOW SOLN Use as directed per meter instructions. 3 each 0  . Blood Glucose Monitoring Suppl (PRODIGY AUTOCODE BLOOD GLUCOSE) W/DEVICE KIT Use two times daily as directed. 1 each 0  . cholecalciferol (VITAMIN D) 1000 UNITS tablet Take 1 tablet (1,000 Units total) by mouth daily. 90 tablet 3  . cyanocobalamin 1000 MCG tablet Take 0.5 tablets (500 mcg total) by mouth daily. 90 tablet 3  . finasteride (PROSCAR) 5 MG tablet Take 1 tablet by mouth once daily 30 tablet 0  . glipiZIDE (GLUCOTROL) 5 MG tablet TAKE 1 TABLET BY MOUTH TWICE DAILY BEFORE MEAL(S) 180 tablet 3  . glucose blood (PRODIGY AUTOCODE TEST) test strip Use one two times daily. Dx: 250.00 100 each 3  . guaiFENesin-Codeine (DIABETIC TUSSIN C PO) Take by mouth as needed.    Marland Kitchen HYDROcodone-acetaminophen (NORCO/VICODIN) 5-325 MG tablet Take 1 tablet by mouth 2 (two) times daily as needed for severe pain. 60 tablet 0  . metFORMIN (GLUCOPHAGE) 1000 MG tablet TAKE 1 TABLET BY MOUTH TWICE DAILY WITH MEALS 180 tablet 1  . penicillin v potassium (VEETID) 500 MG tablet Take 1 tablet by mouth 4 times daily 120 tablet 0  . pioglitazone (ACTOS) 30 MG tablet Take 1 tablet (30 mg total) by mouth daily. 90 tablet 1  . pioglitazone (ACTOS) 30 MG tablet  Take 1 tablet by mouth once daily 90 tablet 1  . pioglitazone (ACTOS) 30 MG tablet Take 1 tablet (30 mg total) by mouth daily. 90 tablet 3  . potassium chloride SA (KLOR-CON) 20 MEQ tablet Take 1 tablet by mouth once daily 90 tablet 3  . PRODIGY TWIST TOP LANCETS 28G MISC Use one two times daily. Dx: 250.00 100 each 3  . triamterene-hydrochlorothiazide (MAXZIDE-25) 37.5-25 MG tablet Take 1 tablet by mouth daily. 90 tablet 0  . tadalafil (CIALIS) 20 MG tablet Take 1 tablet (20 mg total) by mouth daily as needed for erectile dysfunction. 10 tablet 3   No facility-administered medications prior to visit.    ROS: Review of Systems  Constitutional: Negative for appetite change, fatigue and unexpected weight change.  HENT: Negative for congestion, nosebleeds, sneezing, sore throat and trouble swallowing.   Eyes: Negative for itching and visual disturbance.  Respiratory: Negative for cough.   Cardiovascular: Negative for chest pain, palpitations and leg swelling.  Gastrointestinal: Negative for abdominal distention, blood in stool, diarrhea and nausea.  Genitourinary: Negative for frequency and hematuria.  Musculoskeletal: Negative for back pain, gait problem, joint swelling and neck pain.  Skin: Negative for rash.  Neurological: Negative for dizziness, tremors, speech difficulty and weakness.  Psychiatric/Behavioral: Negative for agitation, dysphoric mood and sleep disturbance. The patient is not nervous/anxious.  Objective:  BP 128/76 (BP Location: Left Arm, Patient Position: Sitting, Cuff Size: Large)   Pulse 92   Temp 98.6 F (37 C) (Oral)   Ht 5' 7.5" (1.715 m)   Wt 209 lb (94.8 kg)   SpO2 96%   BMI 32.25 kg/m   BP Readings from Last 3 Encounters:  12/30/19 128/76  12/02/19 132/82  08/25/19 126/78    Wt Readings from Last 3 Encounters:  12/30/19 209 lb (94.8 kg)  12/02/19 209 lb 6.4 oz (95 kg)  08/25/19 209 lb (94.8 kg)    Physical Exam Constitutional:      General:  He is not in acute distress.    Appearance: He is well-developed.     Comments: NAD  Eyes:     Conjunctiva/sclera: Conjunctivae normal.     Pupils: Pupils are equal, round, and reactive to light.  Neck:     Thyroid: No thyromegaly.     Vascular: No JVD.  Cardiovascular:     Rate and Rhythm: Normal rate and regular rhythm.     Heart sounds: Normal heart sounds. No murmur. No friction rub. No gallop.   Pulmonary:     Effort: Pulmonary effort is normal. No respiratory distress.     Breath sounds: Normal breath sounds. No wheezing or rales.  Chest:     Chest wall: No tenderness.  Abdominal:     General: Bowel sounds are normal. There is no distension.     Palpations: Abdomen is soft. There is no mass.     Tenderness: There is no abdominal tenderness. There is no guarding or rebound.     Hernia: A hernia is present.  Musculoskeletal:        General: No tenderness. Normal range of motion.     Cervical back: Normal range of motion.  Lymphadenopathy:     Cervical: No cervical adenopathy.  Skin:    General: Skin is warm and dry.     Findings: No rash.  Neurological:     Mental Status: He is alert and oriented to person, place, and time.     Cranial Nerves: No cranial nerve deficit.     Motor: No abnormal muscle tone.     Coordination: Coordination normal.     Gait: Gait normal.     Deep Tendon Reflexes: Reflexes are normal and symmetric.  Psychiatric:        Behavior: Behavior normal.        Thought Content: Thought content normal.        Judgment: Judgment normal.   umbilical hernia 2 cm   Lab Results  Component Value Date   WBC 8.8 08/25/2019   HGB 13.3 08/25/2019   HCT 40.8 08/25/2019   PLT 237.0 08/25/2019   GLUCOSE 214 (H) 08/25/2019   CHOL 156 01/02/2014   TRIG 265.0 (H) 01/02/2014   HDL 44.40 01/02/2014   LDLDIRECT 71.5 06/29/2010   LDLCALC 59 01/02/2014   ALT 14 11/24/2016   AST 11 11/24/2016   NA 138 08/25/2019   K 4.0 08/25/2019   CL 100 08/25/2019    CREATININE 1.09 08/25/2019   BUN 13 08/25/2019   CO2 26 08/25/2019   TSH 1.31 01/01/2018   PSA 10.46 (H) 12/02/2019   HGBA1C 7.0 (H) 08/25/2019   MICROALBUR 0.7 11/24/2016    DG Chest 2 View  Result Date: 09/22/2010 *RADIOLOGY REPORT* Clinical Data: Shoulder spasm was obtained. CHEST - 2 VIEW Comparison: 08/05/2009 Findings: Normal heart size.  Linear scar at the left  base.  Right lung is clear.  No pneumothorax or pleural effusion.  No acute bony deformity. IMPRESSION: No active cardiopulmonary disease. Original Report Authenticated By: Jamas Lav, M.D.  DG Shoulder Left  Result Date: 09/22/2010 *RADIOLOGY REPORT* Clinical Data: Shoulder spasm was and pain LEFT SHOULDER - 2+ VIEW Comparison: None. Findings: No acute fracture.  No dislocation.  Unremarkable soft tissues. IMPRESSION: No acute bony pathology. Original Report Authenticated By: Jamas Lav, M.D.   Assessment & Plan:   There are no diagnoses linked to this encounter.   No orders of the defined types were placed in this encounter.    Follow-up: No follow-ups on file.  Walker Kehr, MD

## 2019-12-30 NOTE — Assessment & Plan Note (Signed)
Metformin Rx renewed

## 2019-12-31 LAB — HEMOGLOBIN A1C: Hgb A1c MFr Bld: 7.2 % — ABNORMAL HIGH (ref 4.6–6.5)

## 2020-01-01 LAB — PMP SCREEN PROFILE (10S), URINE
Amphetamine Scrn, Ur: NEGATIVE ng/mL
BARBITURATE SCREEN URINE: NEGATIVE ng/mL
BENZODIAZEPINE SCREEN, URINE: NEGATIVE ng/mL
CANNABINOIDS UR QL SCN: NEGATIVE ng/mL
Cocaine (Metab) Scrn, Ur: NEGATIVE ng/mL
Creatinine(Crt), U: 64.1 mg/dL (ref 20.0–300.0)
Methadone Screen, Urine: NEGATIVE ng/mL
OXYCODONE+OXYMORPHONE UR QL SCN: NEGATIVE ng/mL
Opiate Scrn, Ur: NEGATIVE ng/mL
Ph of Urine: 5.6 (ref 4.5–8.9)
Phencyclidine Qn, Ur: NEGATIVE ng/mL
Propoxyphene Scrn, Ur: NEGATIVE ng/mL

## 2020-01-08 ENCOUNTER — Encounter: Payer: Self-pay | Admitting: *Deleted

## 2020-01-08 NOTE — Progress Notes (Signed)
Tried calling pt again still no answer. Generated lab letter mailed out to pt w/MD response.Marland KitchenRaechel Chute

## 2020-01-14 ENCOUNTER — Telehealth: Payer: Self-pay | Admitting: Internal Medicine

## 2020-01-14 NOTE — Telephone Encounter (Signed)
Please call with lab results 

## 2020-01-14 NOTE — Telephone Encounter (Signed)
Lab letter was mailed to pt on 01/08/20. See labs.

## 2020-01-20 ENCOUNTER — Telehealth: Payer: Self-pay

## 2020-01-20 ENCOUNTER — Telehealth: Payer: Self-pay | Admitting: Internal Medicine

## 2020-01-20 NOTE — Telephone Encounter (Signed)
    1.Medication Requested:HYDROcodone-acetaminophen (NORCO/VICODIN) 5-325 MG tablet  2. Pharmacy (Name, Street, [place]):[facility] - [place], [place] - [address]  3. On Med List: yes  4. Last Visit with PCP: 12/30/19  5. Next visit date with PCP:04/08/20   Agent: Please be advised that RX refills may take up to 3 business days. We ask that you follow-up with your pharmacy.

## 2020-01-20 NOTE — Telephone Encounter (Signed)
Check [place] registry last filled 12/23/2019../lmb ° °

## 2020-01-20 NOTE — Telephone Encounter (Signed)
New message    The patient calling for test results.   

## 2020-01-21 MED ORDER — HYDROCODONE-ACETAMINOPHEN 5-325 MG PO TABS: 1.0000 | ORAL_TABLET | Freq: Two times a day (BID) | ORAL | 0 refills | Status: DC | PRN

## 2020-01-21 NOTE — Telephone Encounter (Signed)
Done. Thanks.

## 2020-01-21 NOTE — Telephone Encounter (Signed)
Called pt there was no answer. LMOM stating lab letter was mail out 01/08/20 which he should have received...[name]

## 2020-01-30 ENCOUNTER — Other Ambulatory Visit: Payer: Self-pay | Admitting: Internal Medicine

## 2020-02-05 ENCOUNTER — Ambulatory Visit: Payer: Medicare HMO | Admitting: Internal Medicine

## 2020-02-16 ENCOUNTER — Other Ambulatory Visit: Payer: Self-pay | Admitting: Internal Medicine

## 2020-02-16 NOTE — Telephone Encounter (Signed)
[place] Controlled Database Checked Last filled:   01/21/20 # 60 LOV w/you:  12/30/19 Next appt w/you:  04/08/20

## 2020-02-16 NOTE — Telephone Encounter (Signed)
Patient is requesting a refill on the following medication : HYDROcodone-acetaminophen (NORCO/VICODIN) 5-325 MG tablet   Pharmacy on file.

## 2020-02-19 MED ORDER — HYDROCODONE-ACETAMINOPHEN 5-325 MG PO TABS: 1.0000 | ORAL_TABLET | Freq: Two times a day (BID) | ORAL | 0 refills | Status: DC | PRN

## 2020-03-04 ENCOUNTER — Ambulatory Visit: Payer: Medicare HMO | Admitting: Internal Medicine

## 2020-03-16 ENCOUNTER — Telehealth: Payer: Self-pay

## 2020-03-16 NOTE — Telephone Encounter (Signed)
1.Medication Requested:HYDROcodone-acetaminophen (NORCO/VICODIN) 5-325 MG tablet  2. Pharmacy (Name, Street, [place]):[facility] - [place], [place] - [address]  3. On Med List: yes   4. Last Visit with PCP: 5.11.21   5. Next visit date with PCP: 8.19.21   Agent: Please be advised that RX refills may take up to 3 business days. We ask that you follow-up with your pharmacy.

## 2020-03-18 MED ORDER — HYDROCODONE-ACETAMINOPHEN 5-325 MG PO TABS: 1.0000 | ORAL_TABLET | Freq: Two times a day (BID) | ORAL | 0 refills | Status: DC | PRN

## 2020-03-18 NOTE — Addendum Note (Signed)
Addended by: Tresa Garter on: 03/18/2020 07:32 AM   Modules accepted: Orders

## 2020-03-18 NOTE — Telephone Encounter (Signed)
Ok Thx 

## 2020-03-24 ENCOUNTER — Other Ambulatory Visit: Payer: Self-pay | Admitting: Internal Medicine

## 2020-04-08 ENCOUNTER — Other Ambulatory Visit: Payer: Self-pay

## 2020-04-08 ENCOUNTER — Ambulatory Visit (INDEPENDENT_AMBULATORY_CARE_PROVIDER_SITE_OTHER): Payer: Medicare HMO | Admitting: Internal Medicine

## 2020-04-08 ENCOUNTER — Encounter: Payer: Self-pay | Admitting: Internal Medicine

## 2020-04-08 DIAGNOSIS — E1165 Type 2 diabetes mellitus with hyperglycemia: Secondary | ICD-10-CM | POA: Diagnosis not present

## 2020-04-08 DIAGNOSIS — Z6831 Body mass index (BMI) 31.0-31.9, adult: Secondary | ICD-10-CM

## 2020-04-08 DIAGNOSIS — N320 Bladder-neck obstruction: Secondary | ICD-10-CM | POA: Diagnosis not present

## 2020-04-08 DIAGNOSIS — E6609 Other obesity due to excess calories: Secondary | ICD-10-CM

## 2020-04-08 DIAGNOSIS — E538 Deficiency of other specified B group vitamins: Secondary | ICD-10-CM

## 2020-04-08 MED ORDER — TADALAFIL 5 MG PO TABS: 5.0000 mg | ORAL_TABLET | Freq: Every day | ORAL | 11 refills | Status: DC

## 2020-04-08 NOTE — Progress Notes (Signed)
Subjective:  Patient ID: Timothy Schmidt, male    DOB: [birth year]  Age: 64 y.o. MRN: [id number]  CC: No chief complaint on file.   HPI Timothy Schmidt presents for DM, HTN, LBP C/o BPH, ED Pt had a COVID 70 JJ vaccine today  Outpatient Medications Prior to Visit  Medication Sig Dispense Refill  . Alcohol Swabs (B-D SINGLE USE SWABS BUTTERFLY) PADS Use one two times daily.Dx: 250.00 100 each 3  . Ascorbic Acid (VITAMIN C WITH ROSE HIPS) 1000 MG tablet Take 1,000 mg by mouth daily.    Marland Kitchen aspirin EC 81 MG tablet Take 81 mg by mouth daily.    . Blood Glucose Calibration (PRODIGY CONTROL SOLUTION) LOW SOLN Use as directed per meter instructions. 3 each 0  . Blood Glucose Monitoring Suppl (PRODIGY AUTOCODE BLOOD GLUCOSE) W/DEVICE KIT Use two times daily as directed. 1 each 0  . cholecalciferol (VITAMIN D) 1000 UNITS tablet Take 1 tablet (1,000 Units total) by mouth daily. 90 tablet 3  . cyanocobalamin 1000 MCG tablet Take 0.5 tablets (500 mcg total) by mouth daily. 90 tablet 3  . finasteride (PROSCAR) 5 MG tablet Take 1 tablet by mouth once daily 30 tablet 0  . glipiZIDE (GLUCOTROL) 5 MG tablet TAKE 1 TABLET BY MOUTH TWICE DAILY BEFORE MEAL(S) 180 tablet 3  . glucose blood (PRODIGY AUTOCODE TEST) test strip Use one two times daily. Dx: 250.00 100 each 3  . guaiFENesin-Codeine (DIABETIC TUSSIN C PO) Take by mouth as needed.    Marland Kitchen HYDROcodone-acetaminophen (NORCO/VICODIN) 5-325 MG tablet Take 1 tablet by mouth 2 (two) times daily as needed for severe pain. 60 tablet 0  . metFORMIN (GLUCOPHAGE) 1000 MG tablet Take 1 tablet (1,000 mg total) by mouth 2 (two) times daily with a meal. 180 tablet 3  . penicillin v potassium (VEETID) 500 MG tablet Take 1 tablet by mouth 4 times daily 120 tablet 0  . pioglitazone (ACTOS) 30 MG tablet Take 1 tablet (30 mg total) by mouth daily. 90 tablet 3  . potassium chloride SA (KLOR-CON) 20 MEQ tablet Take 1 tablet by mouth once daily 90 tablet 3  . PRODIGY TWIST  TOP LANCETS 28G MISC Use one two times daily. Dx: 250.00 100 each 3  . triamterene-hydrochlorothiazide (MAXZIDE-25) 37.5-25 MG tablet Take 1 tablet by mouth once daily 90 tablet 0  . tadalafil (CIALIS) 20 MG tablet Take 1 tablet (20 mg total) by mouth daily as needed for erectile dysfunction. 10 tablet 3   No facility-administered medications prior to visit.    ROS: Review of Systems  Constitutional: Positive for unexpected weight change. Negative for appetite change and fatigue.  HENT: Negative for congestion, nosebleeds, sneezing, sore throat and trouble swallowing.   Eyes: Negative for itching and visual disturbance.  Respiratory: Negative for cough.   Cardiovascular: Negative for chest pain, palpitations and leg swelling.  Gastrointestinal: Negative for abdominal distention, blood in stool, diarrhea and nausea.  Genitourinary: Negative for frequency and hematuria.  Musculoskeletal: Positive for back pain. Negative for gait problem, joint swelling and neck pain.  Skin: Negative for rash.  Neurological: Negative for dizziness, tremors, speech difficulty and weakness.  Psychiatric/Behavioral: Negative for agitation, dysphoric mood and sleep disturbance. The patient is not nervous/anxious.     Objective:  BP 122/78   Pulse 100   Temp 98.4 F (36.9 C) (Oral)   Ht 5' 7.5" (1.715 m)   Wt 211 lb (95.7 kg)   SpO2 97%   BMI 32.56 kg/m  BP Readings from Last 3 Encounters:  04/08/20 122/78  12/30/19 128/76  12/02/19 132/82    Wt Readings from Last 3 Encounters:  04/08/20 211 lb (95.7 kg)  12/30/19 209 lb (94.8 kg)  12/02/19 209 lb 6.4 oz (95 kg)    Physical Exam Constitutional:      General: He is not in acute distress.    Appearance: He is well-developed. He is obese.     Comments: NAD  Eyes:     Conjunctiva/sclera: Conjunctivae normal.     Pupils: Pupils are equal, round, and reactive to light.  Neck:     Thyroid: No thyromegaly.     Vascular: No JVD.   Cardiovascular:     Rate and Rhythm: Normal rate and regular rhythm.     Heart sounds: Normal heart sounds. No murmur heard.  No friction rub. No gallop.   Pulmonary:     Effort: Pulmonary effort is normal. No respiratory distress.     Breath sounds: Normal breath sounds. No wheezing or rales.  Chest:     Chest wall: No tenderness.  Abdominal:     General: Bowel sounds are normal. There is no distension.     Palpations: Abdomen is soft. There is no mass.     Tenderness: There is no abdominal tenderness. There is no guarding or rebound.  Musculoskeletal:        General: Tenderness present. Normal range of motion.     Cervical back: Normal range of motion.  Lymphadenopathy:     Cervical: No cervical adenopathy.  Skin:    General: Skin is warm and dry.     Findings: No rash.  Neurological:     Mental Status: He is alert.     Cranial Nerves: No cranial nerve deficit.     Motor: No abnormal muscle tone.     Coordination: Coordination normal.     Gait: Gait normal.     Deep Tendon Reflexes: Reflexes are normal and symmetric.  Psychiatric:        Behavior: Behavior normal.        Thought Content: Thought content normal.        Judgment: Judgment normal.   poor dentition LS stiff  Lab Results  Component Value Date   WBC 8.8 08/25/2019   HGB 13.3 08/25/2019   HCT 40.8 08/25/2019   PLT 237.0 08/25/2019   GLUCOSE 214 (H) 08/25/2019   CHOL 156 01/02/2014   TRIG 265.0 (H) 01/02/2014   HDL 44.40 01/02/2014   LDLDIRECT 71.5 06/29/2010   LDLCALC 59 01/02/2014   ALT 14 11/24/2016   AST 11 11/24/2016   NA 138 08/25/2019   K 4.0 08/25/2019   CL 100 08/25/2019   CREATININE 1.09 08/25/2019   BUN 13 08/25/2019   CO2 26 08/25/2019   TSH 1.31 01/01/2018   PSA 10.46 (H) 12/02/2019   HGBA1C 7.2 (H) 12/30/2019   MICROALBUR 0.7 11/24/2016    DG Chest 2 View  Result Date: 09/22/2010 *RADIOLOGY REPORT* Clinical Data: Shoulder spasm was obtained. CHEST - 2 VIEW Comparison:  08/05/2009 Findings: Normal heart size.  Linear scar at the left base.  Right lung is clear.  No pneumothorax or pleural effusion.  No acute bony deformity. IMPRESSION: No active cardiopulmonary disease. Original Report Authenticated By: Jamas Lav, M.D.  DG Shoulder Left  Result Date: 09/22/2010 *RADIOLOGY REPORT* Clinical Data: Shoulder spasm was and pain LEFT SHOULDER - 2+ VIEW Comparison: None. Findings: No acute fracture.  No dislocation.  Unremarkable  soft tissues. IMPRESSION: No acute bony pathology. Original Report Authenticated By: Jamas Lav, M.D.   Assessment & Plan:   There are no diagnoses linked to this encounter.   No orders of the defined types were placed in this encounter.    Follow-up: No follow-ups on file.  Walker Kehr, MD

## 2020-04-08 NOTE — Assessment & Plan Note (Signed)
Start Cialis 5 mg/d 

## 2020-04-08 NOTE — Assessment & Plan Note (Signed)
Wt Readings from Last 3 Encounters:  04/08/20 211 lb (95.7 kg)  12/30/19 209 lb (94.8 kg)  12/02/19 209 lb 6.4 oz (95 kg)

## 2020-04-08 NOTE — Assessment & Plan Note (Signed)
Pt had a COVID 19 JJ vaccine today

## 2020-04-08 NOTE — Assessment & Plan Note (Signed)
On B12 

## 2020-04-19 ENCOUNTER — Other Ambulatory Visit: Payer: Self-pay | Admitting: Internal Medicine

## 2020-04-19 NOTE — Telephone Encounter (Signed)
    1.Medication Requested: HYDROcodone-acetaminophen (NORCO/VICODIN) 5-325 MG tablet  2. Pharmacy (Name, Street, [place]):[facility] - [place], [place] - [address] 3. On Med List: yes  4. Last Visit with PCP: 04/08/20  5. Next visit date with PCP: 07/08/20   Agent: Please be advised that RX refills may take up to 3 business days. We ask that you follow-up with your pharmacy.

## 2020-04-20 MED ORDER — HYDROCODONE-ACETAMINOPHEN 5-325 MG PO TABS: 1.0000 | ORAL_TABLET | Freq: Two times a day (BID) | ORAL | 0 refills | Status: DC | PRN

## 2020-04-20 NOTE — Telephone Encounter (Signed)
Please 04/20/2020 med refill

## 2020-04-20 NOTE — Telephone Encounter (Signed)
Done erx 

## 2020-04-20 NOTE — Telephone Encounter (Signed)
[place] Controlled Database Checked Last filled: 03/18/2020 (60) LOV w/you: 04/08/20 Next appt w/you: 07/08/2020  Please advise in PCP's absence

## 2020-04-21 ENCOUNTER — Other Ambulatory Visit: Payer: Self-pay | Admitting: Internal Medicine

## 2020-04-29 ENCOUNTER — Other Ambulatory Visit: Payer: Self-pay | Admitting: Internal Medicine

## 2020-05-20 ENCOUNTER — Telehealth: Payer: Self-pay | Admitting: Internal Medicine

## 2020-05-20 NOTE — Telephone Encounter (Signed)
Patient called and requested a med refill for HYDROcodone-acetaminophen (NORCO/VICODIN) 5-325 MG tablet Prescription can be sent to [facility] - [place], [place] - [address]

## 2020-05-23 MED ORDER — HYDROCODONE-ACETAMINOPHEN 5-325 MG PO TABS: 1.0000 | ORAL_TABLET | Freq: Two times a day (BID) | ORAL | 0 refills | Status: DC | PRN

## 2020-05-23 NOTE — Telephone Encounter (Signed)
Okay.  Done, thanks 

## 2020-06-15 ENCOUNTER — Telehealth: Payer: Self-pay | Admitting: Internal Medicine

## 2020-06-15 NOTE — Telephone Encounter (Signed)
    1.Medication Requested:HYDROcodone-acetaminophen (NORCO/VICODIN) 5-325 MG tablet  2. Pharmacy (Name, Street, [place]):[facility] - [place], [place] - [address]  3. On Med List: yes  4. Last Visit with PCP: 04/08/20  5. Next visit date with PCP: 07/08/20   Agent: Please be advised that RX refills may take up to 3 business days. We ask that you follow-up with your pharmacy.

## 2020-06-18 MED ORDER — HYDROCODONE-ACETAMINOPHEN 5-325 MG PO TABS: 1.0000 | ORAL_TABLET | Freq: Two times a day (BID) | ORAL | 0 refills | Status: DC | PRN

## 2020-06-18 NOTE — Telephone Encounter (Signed)
Okay.  Please keep return office visit.  Thanks

## 2020-06-19 ENCOUNTER — Other Ambulatory Visit: Payer: Self-pay | Admitting: Internal Medicine

## 2020-07-08 ENCOUNTER — Ambulatory Visit: Payer: Medicare HMO | Admitting: Internal Medicine

## 2020-07-19 ENCOUNTER — Ambulatory Visit: Payer: Medicare HMO | Admitting: Internal Medicine

## 2020-07-19 ENCOUNTER — Other Ambulatory Visit: Payer: Self-pay | Admitting: Internal Medicine

## 2020-07-19 ENCOUNTER — Telehealth: Payer: Self-pay | Admitting: Internal Medicine

## 2020-07-19 NOTE — Telephone Encounter (Signed)
HYDROcodone-acetaminophen (NORCO/VICODIN) 5-325 MG tablet [facility] - [place], [place] - [address] Phone:  [phone]  Fax:  [phone]     Requesting a refill  Last seen-08.19.21 Next apt- 12.02.21

## 2020-07-19 NOTE — Telephone Encounter (Signed)
Check [place] registry last filled 06/20/2020.Marland KitchenRaechel Schmidt

## 2020-07-20 MED ORDER — HYDROCODONE-ACETAMINOPHEN 5-325 MG PO TABS: 1.0000 | ORAL_TABLET | Freq: Two times a day (BID) | ORAL | 0 refills | Status: DC | PRN

## 2020-07-20 NOTE — Telephone Encounter (Signed)
Okay-done.  He needs an office visit every 3 months.

## 2020-07-21 NOTE — Telephone Encounter (Signed)
Pt has appt 07/26/20,,/lmb

## 2020-07-22 ENCOUNTER — Ambulatory Visit: Payer: Medicare HMO | Admitting: Internal Medicine

## 2020-07-26 ENCOUNTER — Ambulatory Visit: Payer: Medicare HMO | Admitting: Internal Medicine

## 2020-07-26 ENCOUNTER — Ambulatory Visit: Payer: Medicare HMO

## 2020-07-29 ENCOUNTER — Ambulatory Visit: Payer: Medicare HMO | Admitting: Internal Medicine

## 2020-08-02 ENCOUNTER — Other Ambulatory Visit: Payer: Self-pay | Admitting: Internal Medicine

## 2020-08-04 ENCOUNTER — Encounter: Payer: Self-pay | Admitting: Internal Medicine

## 2020-08-04 ENCOUNTER — Other Ambulatory Visit: Payer: Self-pay

## 2020-08-04 ENCOUNTER — Ambulatory Visit (INDEPENDENT_AMBULATORY_CARE_PROVIDER_SITE_OTHER): Payer: Medicare HMO | Admitting: Internal Medicine

## 2020-08-04 DIAGNOSIS — E1165 Type 2 diabetes mellitus with hyperglycemia: Secondary | ICD-10-CM | POA: Diagnosis not present

## 2020-08-04 DIAGNOSIS — N529 Male erectile dysfunction, unspecified: Secondary | ICD-10-CM

## 2020-08-04 DIAGNOSIS — R972 Elevated prostate specific antigen [PSA]: Secondary | ICD-10-CM | POA: Diagnosis not present

## 2020-08-04 DIAGNOSIS — M5440 Lumbago with sciatica, unspecified side: Secondary | ICD-10-CM | POA: Diagnosis not present

## 2020-08-04 DIAGNOSIS — E538 Deficiency of other specified B group vitamins: Secondary | ICD-10-CM

## 2020-08-04 DIAGNOSIS — G8929 Other chronic pain: Secondary | ICD-10-CM

## 2020-08-04 DIAGNOSIS — I10 Essential (primary) hypertension: Secondary | ICD-10-CM

## 2020-08-04 DIAGNOSIS — K029 Dental caries, unspecified: Secondary | ICD-10-CM

## 2020-08-04 MED ORDER — PENICILLIN V POTASSIUM 500 MG PO TABS
ORAL_TABLET | ORAL | 0 refills | Status: DC
Start: 2020-08-04 — End: 2020-11-24

## 2020-08-04 NOTE — Assessment & Plan Note (Signed)
On B12 

## 2020-08-04 NOTE — Assessment & Plan Note (Signed)
Triamt/HCT 

## 2020-08-04 NOTE — Patient Instructions (Addendum)
Wave Therapy Is A New Treatment For Erectile Dysfunction (ED) No surgery, no medication, and no downtime. Wave therapy is changing the lives of men with erectile dysfunction. This treatment which has been treating ED in [place] for years is now available in the [place]. It is a fantastic alternative to oral medications providing an 80% success rate.  What to expect: Improved ability to obtain erections Improved strength and duration of erections Increased morning and nighttime erections Enhanced effects of ED medications if applicable  Who is a candidate for Wave Therapy? Wave Therapy is a fantastic treatment for men who have difficulty obtaining and maintaining erections. This is known as erectile dysfunction or ED.  How is Wave Therapy performed? Step 1: Numbing cream is applied if desired. Step 2: Wave Therapy is done using our Evive by [employer]. This is a low intensity shockwave device. Target area is covered with ultrasound gel. The hand piece is then applied to the targeted area. Step 3: Schedule follow-up appointment  What is the Evive? The Evive works by using Low Intensity Extracorpreal Soundwaves to remove micro plaque, stimulate the growth of new blood vessels and improve blood flow.  Whats the downtime after my procedure? None. Patients can return to normal activities immediately. This includes sexual intimacy, coitus, penile stimulation, athletic activities and other activities of daily living. If you received any numbing it may take up to 6 hours to wear off.  How many treatments will I need? For optimal results, it is recommended that patients have two treatments every week for three weeks.  How much does Wave Therapy for Erectile Dysfunction cost? Each treatment is $500. The total package is $3000.  How long does the procedure take? 20 minutes typically  Schedule a Comprehensive Erectile Dysfunction Consultation by Calling us Today at [phone] Who does the  procedure? We have a physicians and nurses on staff who perform the procedure.  Does Wave Therapy hurt? Wave Therapy is not uncomfortable and there is no postprocedure care required.  Maintenance required? This will vary but typically one treatment is recommended.  Do you offer other procedures for Erectile Dysfunction? We offer P-Shot. We also offer performance infusion and oral spray Viagra like alternatives. Penile fat grafting is another option which increase size and often reverses erectile dysfunction.  What if Im just not interested in sex? There are number of possible causes. Schedule a consultation today and let us help you get to the bottom of the root cause.  Will Wave Therapy make my penis bigger? If paired with the Hydromax you can have increase girth and length.  We offer the following other options for penile enlargement:  Width and Girth Injection Series using Hyaluronic Acid Filler  Penuma (Coming soon)  If any of the above interest you please call today for a comprehensive assessment.  At [facility], we offer the wave therapy to those in [place], [place], [place], [place], [place], and [place], [place].  CONTACT us TODAY! Your Name (required)  Your Email (required)  Subject  Your Message   HOURS OF OPERATION Sun Closed Mon 9:00 am - 5:00 pm  Tue 9:00 am - 5:00 pm  Wed 9:00 am - 5:00 pm  Thur 9:00 am - 5:00 pm  Fri 9:00 am - 5:00 pm  Saturday by appointment only  Contact us [facility] [address] [place], [place] [address] Telephone: [phone]

## 2020-08-04 NOTE — Assessment & Plan Note (Signed)
PCN Dental appt is needed

## 2020-08-04 NOTE — Assessment & Plan Note (Signed)
Metformin Glipizide, Actos Risks associated with treatment and diet noncompliance were discussed. Compliance was encouraged. Labs

## 2020-08-04 NOTE — Assessment & Plan Note (Signed)
PSA

## 2020-08-04 NOTE — Progress Notes (Signed)
Subjective:  Patient ID: Timothy Schmidt, male    DOB: 12-09-1955  Age: 64 y.o. MRN: [id number]  CC: No chief complaint on file.   HPI [name] presents for DM, HTN, BPH, anxiety f/u [facility] is asking about [facility] ED treatment  C/o ED, DM, caries - needs PCN  Outpatient Medications Prior to Visit  Medication Sig Dispense Refill  . Alcohol Swabs (B-D SINGLE USE SWABS BUTTERFLY) PADS Use one two times daily.Dx: 250.00 100 each 3  . Ascorbic Acid (VITAMIN C WITH ROSE HIPS) 1000 MG tablet Take 1,000 mg by mouth daily.    Marland Kitchen aspirin EC 81 MG tablet Take 81 mg by mouth daily.    . Blood Glucose Calibration (PRODIGY CONTROL SOLUTION) LOW SOLN Use as directed per meter instructions. 3 each 0  . Blood Glucose Monitoring Suppl (PRODIGY AUTOCODE BLOOD GLUCOSE) W/DEVICE KIT Use two times daily as directed. 1 each 0  . cholecalciferol (VITAMIN D) 1000 UNITS tablet Take 1 tablet (1,000 Units total) by mouth daily. 90 tablet 3  . cyanocobalamin 1000 MCG tablet Take 0.5 tablets (500 mcg total) by mouth daily. 90 tablet 3  . finasteride (PROSCAR) 5 MG tablet Take 1 tablet by mouth once daily 30 tablet 0  . glipiZIDE (GLUCOTROL) 5 MG tablet TAKE 1 TABLET BY MOUTH TWICE DAILY BEFORE MEAL(S) 180 tablet 3  . glucose blood (PRODIGY AUTOCODE TEST) test strip Use one two times daily. Dx: 250.00 100 each 3  . guaiFENesin-Codeine (DIABETIC TUSSIN C PO) Take by mouth as needed.    Marland Kitchen HYDROcodone-acetaminophen (NORCO/VICODIN) 5-325 MG tablet Take 1 tablet by mouth 2 (two) times daily as needed for severe pain. 60 tablet 0  . metFORMIN (GLUCOPHAGE) 1000 MG tablet Take 1 tablet (1,000 mg total) by mouth 2 (two) times daily with a meal. 180 tablet 3  . penicillin v potassium (VEETID) 500 MG tablet Take 1 tablet by mouth 4 times daily 120 tablet 0  . pioglitazone (ACTOS) 30 MG tablet Take 1 tablet by mouth once daily 90 tablet 0  . potassium chloride SA (KLOR-CON) 20 MEQ tablet Take 1 tablet by mouth once  daily 90 tablet 1  . PRODIGY TWIST TOP LANCETS 28G MISC Use one two times daily. Dx: 250.00 100 each 3  . tadalafil (CIALIS) 5 MG tablet Take 1 tablet (5 mg total) by mouth daily. 30 tablet 11  . triamterene-hydrochlorothiazide (MAXZIDE-25) 37.5-25 MG tablet Take 1 tablet by mouth once daily 90 tablet 1   No facility-administered medications prior to visit.    ROS: Review of Systems  Constitutional: Positive for unexpected weight change. Negative for appetite change and fatigue.  HENT: Negative for congestion, nosebleeds, sneezing, sore throat and trouble swallowing.   Eyes: Negative for itching and visual disturbance.  Respiratory: Negative for cough.   Cardiovascular: Negative for chest pain, palpitations and leg swelling.  Gastrointestinal: Negative for abdominal distention, blood in stool, diarrhea and nausea.  Genitourinary: Positive for frequency. Negative for hematuria.  Musculoskeletal: Positive for arthralgias and back pain. Negative for gait problem, joint swelling and neck pain.  Skin: Negative for rash.  Neurological: Negative for dizziness, tremors, speech difficulty and weakness.  Psychiatric/Behavioral: Negative for agitation, dysphoric mood, sleep disturbance and suicidal ideas. The patient is not nervous/anxious.     Objective:  BP 132/76   Pulse 92   Temp 98.5 F (36.9 C) (Oral)   Ht 5' 7.5" (1.715 m)   Wt 213 lb (96.6 kg)   SpO2 98%  BMI 32.87 kg/m   BP Readings from Last 3 Encounters:  08/04/20 132/76  04/08/20 122/78  12/30/19 128/76    Wt Readings from Last 3 Encounters:  08/04/20 213 lb (96.6 kg)  04/08/20 211 lb (95.7 kg)  12/30/19 209 lb (94.8 kg)    Physical Exam Constitutional:      General: He is not in acute distress.    Appearance: He is well-developed.     Comments: NAD  HENT:     Mouth/Throat:     Mouth: Oropharynx is clear and moist.  Eyes:     Conjunctiva/sclera: Conjunctivae normal.     Pupils: Pupils are equal, round, and  reactive to light.  Neck:     Thyroid: No thyromegaly.     Vascular: No JVD.  Cardiovascular:     Rate and Rhythm: Normal rate and regular rhythm.     Pulses: Intact distal pulses.     Heart sounds: Normal heart sounds. No murmur heard. No friction rub. No gallop.   Pulmonary:     Effort: Pulmonary effort is normal. No respiratory distress.     Breath sounds: Normal breath sounds. No wheezing or rales.  Chest:     Chest wall: No tenderness.  Abdominal:     General: Bowel sounds are normal. There is no distension.     Palpations: Abdomen is soft. There is no mass.     Tenderness: There is no abdominal tenderness. There is no guarding or rebound.  Musculoskeletal:        General: Tenderness present. No edema. Normal range of motion.     Cervical back: Normal range of motion.  Lymphadenopathy:     Cervical: No cervical adenopathy.  Skin:    General: Skin is warm and dry.     Findings: No rash.  Neurological:     Mental Status: He is alert and oriented to person, place, and time.     Cranial Nerves: No cranial nerve deficit.     Motor: No abnormal muscle tone.     Coordination: He displays a negative Romberg sign. Coordination normal.     Gait: Gait normal.     Deep Tendon Reflexes: Reflexes are normal and symmetric.  Psychiatric:        Mood and Affect: Mood and affect normal.        Behavior: Behavior normal.        Thought Content: Thought content normal.        Judgment: Judgment normal.    LS tender   Lab Results  Component Value Date   WBC 8.8 08/25/2019   HGB 13.3 08/25/2019   HCT 40.8 08/25/2019   PLT 237.0 08/25/2019   GLUCOSE 214 (H) 08/25/2019   CHOL 156 01/02/2014   TRIG 265.0 (H) 01/02/2014   HDL 44.40 01/02/2014   LDLDIRECT 71.5 06/29/2010   LDLCALC 59 01/02/2014   ALT 14 11/24/2016   AST 11 11/24/2016   NA 138 08/25/2019   K 4.0 08/25/2019   CL 100 08/25/2019   CREATININE 1.09 08/25/2019   BUN 13 08/25/2019   CO2 26 08/25/2019   TSH 1.31  01/01/2018   PSA 10.46 (H) 12/02/2019   HGBA1C 7.2 (H) 12/30/2019   MICROALBUR 0.7 11/24/2016    DG Chest 2 View  Result Date: 09/22/2010 *RADIOLOGY REPORT* Clinical Data: Shoulder spasm was obtained. CHEST - 2 VIEW Comparison: 08/05/2009 Findings: Normal heart size.  Linear scar at the left base.  Right lung is clear.  No pneumothorax or  pleural effusion.  No acute bony deformity. IMPRESSION: No active cardiopulmonary disease. Original Report Authenticated By: Jamas Lav, M.D.  DG Shoulder Left  Result Date: 09/22/2010 *RADIOLOGY REPORT* Clinical Data: Shoulder spasm was and pain LEFT SHOULDER - 2+ VIEW Comparison: None. Findings: No acute fracture.  No dislocation.  Unremarkable soft tissues. IMPRESSION: No acute bony pathology. Original Report Authenticated By: Jamas Lav, M.D.   Assessment & Plan:    Walker Kehr, MD

## 2020-08-04 NOTE — Assessment & Plan Note (Signed)
Timothy Schmidt is asking about [facility] ED treatment - info given

## 2020-08-04 NOTE — Assessment & Plan Note (Signed)
Norco prn  Potential benefits of a long term prn opioids use as well as potential risks (i.e. addiction risk, apnea etc) and complications (i.e. Somnolence, constipation and others) were explained to the patient and were aknowledged.  

## 2020-08-16 ENCOUNTER — Telehealth: Payer: Self-pay | Admitting: Internal Medicine

## 2020-08-16 NOTE — Telephone Encounter (Signed)
Copied from CRM [id number]. Topic: Medicare AWV >> Aug 16, 2020 12:19 PM Claudette Laws R wrote: Reason for CRM:  Left message for patient to call back and schedule Medicare Annual Wellness Visit (AWV) in office.   If not able to come in office, please offer to do virtually.  45 Minute appointment   AWVI eligible as of 09/21/2009  Please schedule at anytime with the Nurse Health Advisor.

## 2020-08-18 ENCOUNTER — Telehealth: Payer: Self-pay | Admitting: Internal Medicine

## 2020-08-18 NOTE — Telephone Encounter (Signed)
1.MedicationRequested: HYDROcodone-acetaminophen (NORCO/VICODIN) 5-325 MG tablet    2. Pharmacy (Name, Street, [place]): [facility] - [place], [place] - [address]  3. On Med List: yes   4. Last Visit with PCP: 12.15.21  5. Next visit date with PCP: n/a   Agent: Please be advised that RX refills may take up to 3 business days. We ask that you follow-up with your pharmacy.

## 2020-08-23 MED ORDER — HYDROCODONE-ACETAMINOPHEN 5-325 MG PO TABS: 1.0000 | ORAL_TABLET | Freq: Two times a day (BID) | ORAL | 0 refills | Status: DC | PRN

## 2020-08-23 NOTE — Telephone Encounter (Signed)
Done. Thanks.

## 2020-09-19 ENCOUNTER — Other Ambulatory Visit: Payer: Self-pay | Admitting: Internal Medicine

## 2020-09-20 ENCOUNTER — Telehealth: Payer: Self-pay | Admitting: Internal Medicine

## 2020-09-20 NOTE — Telephone Encounter (Signed)
1.Medication Requested: HYDROcodone-acetaminophen (NORCO/VICODIN) 5-325 MG tablet    2. Pharmacy (Name, Street, [place]): [facility] - [place], [place] - [address]  3. On Med List: yes   4. Last Visit with PCP: 12.15.21  5. Next visit date with PCP: 3.24.22   Agent: Please be advised that RX refills may take up to 3 business days. We ask that you follow-up with your pharmacy.

## 2020-09-20 NOTE — Telephone Encounter (Signed)
Check [place] registry last filled 08/23/2020.Marland KitchenRaechel Chute

## 2020-09-21 MED ORDER — HYDROCODONE-ACETAMINOPHEN 5-325 MG PO TABS: 1.0000 | ORAL_TABLET | Freq: Two times a day (BID) | ORAL | 0 refills | Status: DC | PRN

## 2020-09-21 NOTE — Telephone Encounter (Signed)
Okay.  Thanks.

## 2020-10-13 ENCOUNTER — Telehealth: Payer: Self-pay | Admitting: Internal Medicine

## 2020-10-13 MED ORDER — HYDROCODONE-ACETAMINOPHEN 5-325 MG PO TABS: 1.0000 | ORAL_TABLET | Freq: Two times a day (BID) | ORAL | 0 refills | Status: DC | PRN

## 2020-10-13 NOTE — Telephone Encounter (Signed)
Patient requesting refill for HYDROcodone-acetaminophen (NORCO/VICODIN) 5-325 MG tablet  Pharmacy [facility] - [place], [place] - [address]

## 2020-10-13 NOTE — Telephone Encounter (Signed)
Okay.  Office visit every 3 months.  Thanks 

## 2020-10-13 NOTE — Telephone Encounter (Signed)
appt made for 11/11/20.Marland KitchenRaechel Chute

## 2020-11-11 ENCOUNTER — Ambulatory Visit: Payer: Medicare HMO | Admitting: Internal Medicine

## 2020-11-16 ENCOUNTER — Telehealth: Payer: Self-pay | Admitting: Internal Medicine

## 2020-11-16 MED ORDER — HYDROCODONE-ACETAMINOPHEN 5-325 MG PO TABS: 1.0000 | ORAL_TABLET | Freq: Two times a day (BID) | ORAL | 0 refills | Status: DC | PRN

## 2020-11-16 NOTE — Telephone Encounter (Signed)
1.Medication Requested: HYDROcodone-acetaminophen (NORCO/VICODIN) 5-325 MG tablet    2. Pharmacy (Name, Street, [place]): [facility] - [place], [place] - [address]  3. On Med List: yes   4. Last Visit with PCP: 08/04/20  5. Next visit date with PCP: 11/24/20   Agent: Please be advised that RX refills may take up to 3 business days. We ask that you follow-up with your pharmacy.

## 2020-11-16 NOTE — Telephone Encounter (Signed)
Okay.  Thanks.

## 2020-11-24 ENCOUNTER — Ambulatory Visit (INDEPENDENT_AMBULATORY_CARE_PROVIDER_SITE_OTHER): Payer: Medicare HMO | Admitting: Internal Medicine

## 2020-11-24 ENCOUNTER — Encounter: Payer: Self-pay | Admitting: Internal Medicine

## 2020-11-24 ENCOUNTER — Other Ambulatory Visit: Payer: Self-pay

## 2020-11-24 DIAGNOSIS — R972 Elevated prostate specific antigen [PSA]: Secondary | ICD-10-CM

## 2020-11-24 DIAGNOSIS — Z6831 Body mass index (BMI) 31.0-31.9, adult: Secondary | ICD-10-CM

## 2020-11-24 DIAGNOSIS — K029 Dental caries, unspecified: Secondary | ICD-10-CM | POA: Diagnosis not present

## 2020-11-24 DIAGNOSIS — G8929 Other chronic pain: Secondary | ICD-10-CM

## 2020-11-24 DIAGNOSIS — E1165 Type 2 diabetes mellitus with hyperglycemia: Secondary | ICD-10-CM | POA: Diagnosis not present

## 2020-11-24 DIAGNOSIS — I10 Essential (primary) hypertension: Secondary | ICD-10-CM | POA: Diagnosis not present

## 2020-11-24 DIAGNOSIS — E538 Deficiency of other specified B group vitamins: Secondary | ICD-10-CM | POA: Diagnosis not present

## 2020-11-24 DIAGNOSIS — M5440 Lumbago with sciatica, unspecified side: Secondary | ICD-10-CM | POA: Diagnosis not present

## 2020-11-24 DIAGNOSIS — E6609 Other obesity due to excess calories: Secondary | ICD-10-CM

## 2020-11-24 DIAGNOSIS — K429 Umbilical hernia without obstruction or gangrene: Secondary | ICD-10-CM

## 2020-11-24 LAB — COMPREHENSIVE METABOLIC PANEL
ALT: 11 U/L (ref 0–53)
AST: 11 U/L (ref 0–37)
Albumin: 4.2 g/dL (ref 3.5–5.2)
Alkaline Phosphatase: 46 U/L (ref 39–117)
BUN: 13 mg/dL (ref 6–23)
CO2: 30 mEq/L (ref 19–32)
Calcium: 10.1 mg/dL (ref 8.4–10.5)
Chloride: 104 mEq/L (ref 96–112)
Creatinine, Ser: 1.27 mg/dL (ref 0.40–1.50)
GFR: 59.66 mL/min — ABNORMAL LOW (ref >60.00)
Glucose, Bld: 109 mg/dL — ABNORMAL HIGH (ref 70–99)
Potassium: 4 mEq/L (ref 3.5–5.1)
Sodium: 141 mEq/L (ref 135–145)
Total Bilirubin: 0.3 mg/dL (ref 0.2–1.2)
Total Protein: 6.7 g/dL (ref 6.0–8.3)

## 2020-11-24 LAB — PSA: PSA: 12.54 ng/mL — ABNORMAL HIGH (ref 0.10–4.00)

## 2020-11-24 LAB — HEMOGLOBIN A1C: Hgb A1c MFr Bld: 7.3 % — ABNORMAL HIGH (ref 4.6–6.5)

## 2020-11-24 MED ORDER — PENICILLIN V POTASSIUM 500 MG PO TABS: ORAL_TABLET | ORAL | 0 refills | Status: DC

## 2020-11-24 NOTE — Assessment & Plan Note (Signed)
Extensive. He hasn't come up with money yet. PCN prn See a dentist

## 2020-11-24 NOTE — Addendum Note (Signed)
Addended by: Madelon Lips on: 11/24/2020 03:44 PM   Modules accepted: Orders

## 2020-11-24 NOTE — Assessment & Plan Note (Addendum)
Check A1c Metformin Glipizide, Actos

## 2020-11-24 NOTE — Assessment & Plan Note (Signed)
Wt Readings from Last 3 Encounters:  11/24/20 210 lb (95.3 kg)  08/04/20 213 lb (96.6 kg)  04/08/20 211 lb (95.7 kg)  B[place]ian lost wt

## 2020-11-24 NOTE — Progress Notes (Signed)
Subjective:  Patient ID: Timothy Schmidt, male    DOB: 04-28-1956  Age: 65 y.o. MRN: [id number]  CC: Follow-up (3 month follow-up)   HPI AUNDRE HIETALA presents for LBP, DM, HTN  Outpatient Medications Prior to Visit  Medication Sig Dispense Refill  . Alcohol Swabs (B-D SINGLE USE SWABS BUTTERFLY) PADS Use one two times daily.Dx: 250.00 100 each 3  . Ascorbic Acid (VITAMIN C WITH ROSE HIPS) 1000 MG tablet Take 1,000 mg by mouth daily.    Marland Kitchen aspirin EC 81 MG tablet Take 81 mg by mouth daily.    . Blood Glucose Calibration (PRODIGY CONTROL SOLUTION) LOW SOLN Use as directed per meter instructions. 3 each 0  . Blood Glucose Monitoring Suppl (PRODIGY AUTOCODE BLOOD GLUCOSE) W/DEVICE KIT Use two times daily as directed. 1 each 0  . cholecalciferol (VITAMIN D) 1000 UNITS tablet Take 1 tablet (1,000 Units total) by mouth daily. 90 tablet 3  . cyanocobalamin 1000 MCG tablet Take 0.5 tablets (500 mcg total) by mouth daily. 90 tablet 3  . finasteride (PROSCAR) 5 MG tablet Take 1 tablet by mouth once daily 30 tablet 0  . glipiZIDE (GLUCOTROL) 5 MG tablet TAKE 1 TABLET BY MOUTH TWICE DAILY BEFORE MEAL(S) 180 tablet 3  . glucose blood (PRODIGY AUTOCODE TEST) test strip Use one two times daily. Dx: 250.00 100 each 3  . guaiFENesin-Codeine (DIABETIC TUSSIN C PO) Take by mouth as needed.    Marland Kitchen HYDROcodone-acetaminophen (NORCO/VICODIN) 5-325 MG tablet Take 1 tablet by mouth 2 (two) times daily as needed for severe pain. 60 tablet 0  . metFORMIN (GLUCOPHAGE) 1000 MG tablet Take 1 tablet (1,000 mg total) by mouth 2 (two) times daily with a meal. 180 tablet 3  . penicillin v potassium (VEETID) 500 MG tablet 1 po qid 120 tablet 0  . pioglitazone (ACTOS) 30 MG tablet Take 1 tablet by mouth once daily 90 tablet 1  . potassium chloride SA (KLOR-CON) 20 MEQ tablet Take 1 tablet by mouth once daily 90 tablet 1  . PRODIGY TWIST TOP LANCETS 28G MISC Use one two times daily. Dx: 250.00 100 each 3  . tadalafil  (CIALIS) 5 MG tablet Take 1 tablet (5 mg total) by mouth daily. 30 tablet 11  . triamterene-hydrochlorothiazide (MAXZIDE-25) 37.5-25 MG tablet Take 1 tablet by mouth once daily 90 tablet 1   No facility-administered medications prior to visit.    ROS: Review of Systems  Constitutional: Negative for appetite change, fatigue and unexpected weight change.  HENT: Negative for congestion, nosebleeds, sneezing, sore throat and trouble swallowing.   Eyes: Negative for itching and visual disturbance.  Respiratory: Negative for cough.   Cardiovascular: Negative for chest pain, palpitations and leg swelling.  Gastrointestinal: Negative for abdominal distention, blood in stool, diarrhea and nausea.  Genitourinary: Negative for frequency and hematuria.  Musculoskeletal: Positive for back pain. Negative for gait problem, joint swelling and neck pain.  Skin: Negative for rash.  Neurological: Negative for dizziness, tremors, speech difficulty and weakness.  Psychiatric/Behavioral: Negative for agitation, dysphoric mood and sleep disturbance. The patient is not nervous/anxious.     Objective:  BP 122/70 (BP Location: Left Arm, Patient Position: Sitting, Cuff Size: Large)   Pulse 95   Temp 98.1 F (36.7 C) (Oral)   Ht 5' 7.5" (1.715 m)   Wt 210 lb (95.3 kg)   SpO2 97%   BMI 32.41 kg/m   BP Readings from Last 3 Encounters:  11/24/20 122/70  08/04/20 132/76  04/08/20  122/78    Wt Readings from Last 3 Encounters:  11/24/20 210 lb (95.3 kg)  08/04/20 213 lb (96.6 kg)  04/08/20 211 lb (95.7 kg)    Physical Exam Constitutional:      General: He is not in acute distress.    Appearance: He is well-developed. He is obese.     Comments: NAD  Eyes:     Conjunctiva/sclera: Conjunctivae normal.     Pupils: Pupils are equal, round, and reactive to light.  Neck:     Thyroid: No thyromegaly.     Vascular: No JVD.  Cardiovascular:     Rate and Rhythm: Normal rate and regular rhythm.     Heart  sounds: Normal heart sounds. No murmur heard. No friction rub. No gallop.   Pulmonary:     Effort: Pulmonary effort is normal. No respiratory distress.     Breath sounds: Normal breath sounds. No wheezing or rales.  Chest:     Chest wall: No tenderness.  Abdominal:     General: Bowel sounds are normal. There is no distension.     Palpations: Abdomen is soft. There is no mass.     Tenderness: There is no abdominal tenderness. There is no guarding or rebound.  Musculoskeletal:        General: No tenderness. Normal range of motion.     Cervical back: Normal range of motion.  Lymphadenopathy:     Cervical: No cervical adenopathy.  Skin:    General: Skin is warm and dry.     Findings: No rash.  Neurological:     Mental Status: He is alert and oriented to person, place, and time.     Cranial Nerves: No cranial nerve deficit.     Motor: No abnormal muscle tone.     Coordination: Coordination normal.     Gait: Gait normal.     Deep Tendon Reflexes: Reflexes are normal and symmetric.  Psychiatric:        Behavior: Behavior normal.        Thought Content: Thought content normal.        Judgment: Judgment normal.   caries  Lab Results  Component Value Date   WBC 8.8 08/25/2019   HGB 13.3 08/25/2019   HCT 40.8 08/25/2019   PLT 237.0 08/25/2019   GLUCOSE 214 (H) 08/25/2019   CHOL 156 01/02/2014   TRIG 265.0 (H) 01/02/2014   HDL 44.40 01/02/2014   LDLDIRECT 71.5 06/29/2010   LDLCALC 59 01/02/2014   ALT 14 11/24/2016   AST 11 11/24/2016   NA 138 08/25/2019   K 4.0 08/25/2019   CL 100 08/25/2019   CREATININE 1.09 08/25/2019   BUN 13 08/25/2019   CO2 26 08/25/2019   TSH 1.31 01/01/2018   PSA 10.46 (H) 12/02/2019   HGBA1C 7.2 (H) 12/30/2019   MICROALBUR 0.7 11/24/2016    DG Chest 2 View  Result Date: 09/22/2010 *RADIOLOGY REPORT* Clinical Data: Shoulder spasm was obtained. CHEST - 2 VIEW Comparison: 08/05/2009 Findings: Normal heart size.  Linear scar at the left base.   Right lung is clear.  No pneumothorax or pleural effusion.  No acute bony deformity. IMPRESSION: No active cardiopulmonary disease. Original Report Authenticated By: Jamas Lav, M.D.  DG Shoulder Left  Result Date: 09/22/2010 *RADIOLOGY REPORT* Clinical Data: Shoulder spasm was and pain LEFT SHOULDER - 2+ VIEW Comparison: None. Findings: No acute fracture.  No dislocation.  Unremarkable soft tissues. IMPRESSION: No acute bony pathology. Original Report Authenticated By: Cathlean Sauer  HOSS, M.D.   Assessment & Plan:    Walker Kehr, MD

## 2020-11-24 NOTE — Assessment & Plan Note (Signed)
On B12 

## 2020-11-24 NOTE — Assessment & Plan Note (Signed)
Monitor PSA 

## 2020-11-24 NOTE — Assessment & Plan Note (Signed)
Norco prn  Potential benefits of a long term prn opioids use as well as potential risks (i.e. addiction risk, apnea etc) and complications (i.e. Somnolence, constipation and others) were explained to the patient and were aknowledged.  

## 2020-11-24 NOTE — Assessment & Plan Note (Signed)
Cont w/Triamt/HCT

## 2020-11-24 NOTE — Assessment & Plan Note (Signed)
Timothy Schmidt needs to sch appt w/Gen surgery

## 2020-11-25 LAB — PMP SCREEN PROFILE (10S), URINE
Amphetamine Scrn, Ur: NEGATIVE ng/mL
BARBITURATE SCREEN URINE: NEGATIVE ng/mL
BENZODIAZEPINE SCREEN, URINE: NEGATIVE ng/mL
CANNABINOIDS UR QL SCN: NEGATIVE ng/mL
Cocaine (Metab) Scrn, Ur: NEGATIVE ng/mL
Creatinine(Crt), U: 90.5 mg/dL (ref 20.0–300.0)
Methadone Screen, Urine: NEGATIVE ng/mL
OXYCODONE+OXYMORPHONE UR QL SCN: NEGATIVE ng/mL
Opiate Scrn, Ur: POSITIVE ng/mL — AB
Ph of Urine: 5.9 (ref 4.5–8.9)
Phencyclidine Qn, Ur: NEGATIVE ng/mL
Propoxyphene Scrn, Ur: NEGATIVE ng/mL

## 2020-11-30 ENCOUNTER — Telehealth: Payer: Self-pay

## 2020-11-30 DIAGNOSIS — R972 Elevated prostate specific antigen [PSA]: Secondary | ICD-10-CM

## 2020-11-30 NOTE — Telephone Encounter (Signed)
Pt called back for lab results. Results given and pt would to be referred to an urologist.

## 2020-12-01 NOTE — Telephone Encounter (Signed)
Thx. Referral is placed. Thx

## 2020-12-15 ENCOUNTER — Telehealth: Payer: Self-pay | Admitting: Internal Medicine

## 2020-12-15 NOTE — Telephone Encounter (Signed)
1.Medication Requested: HYDROcodone-acetaminophen (NORCO/VICODIN) 5-325 MG tablet    2. Pharmacy (Name, Street, [place]): [facility] - [place], [place] - [address]  3. On Med List: yes   4. Last Visit with PCP: 11-24-20  5. Next visit date with PCP:03-17-21   Agent: Please be advised that RX refills may take up to 3 business days. We ask that you follow-up with your pharmacy.

## 2020-12-16 NOTE — Telephone Encounter (Signed)
Check [name] registry last filled 11/17/2020.Marland KitchenRaechel Schmidt

## 2020-12-17 MED ORDER — HYDROCODONE-ACETAMINOPHEN 5-325 MG PO TABS: 1.0000 | ORAL_TABLET | Freq: Two times a day (BID) | ORAL | 0 refills | Status: DC | PRN

## 2020-12-17 NOTE — Telephone Encounter (Signed)
Okay.  Thanks.

## 2020-12-28 ENCOUNTER — Other Ambulatory Visit: Payer: Self-pay | Admitting: Internal Medicine

## 2021-01-02 ENCOUNTER — Other Ambulatory Visit: Payer: Self-pay | Admitting: Internal Medicine

## 2021-01-18 ENCOUNTER — Telehealth: Payer: Self-pay | Admitting: Internal Medicine

## 2021-01-18 NOTE — Telephone Encounter (Signed)
Check [place] registry last filled 12/17/2020.Marland KitchenRaechel Chute

## 2021-01-18 NOTE — Telephone Encounter (Signed)
   Patient requesting refill for HYDROcodone-acetaminophen (NORCO/VICODIN) 5-325 MG tablet   Pharmacy [facility] - [place], [place] - [address]

## 2021-01-19 MED ORDER — HYDROCODONE-ACETAMINOPHEN 5-325 MG PO TABS: 1.0000 | ORAL_TABLET | Freq: Two times a day (BID) | ORAL | 0 refills | Status: DC | PRN

## 2021-01-19 NOTE — Telephone Encounter (Signed)
Done. Thanks.

## 2021-01-20 ENCOUNTER — Other Ambulatory Visit: Payer: Self-pay | Admitting: Internal Medicine

## 2021-02-08 ENCOUNTER — Telehealth: Payer: Self-pay | Admitting: Internal Medicine

## 2021-02-08 NOTE — Telephone Encounter (Signed)
    Patient requesting refill for HYDROcodone-acetaminophen (NORCO/VICODIN) 5-325 MG tablet  Pharmacy [facility] - [place], [place] - [address]

## 2021-02-10 MED ORDER — HYDROCODONE-ACETAMINOPHEN 5-325 MG PO TABS: 1.0000 | ORAL_TABLET | Freq: Two times a day (BID) | ORAL | 0 refills | Status: DC | PRN

## 2021-02-10 NOTE — Telephone Encounter (Signed)
Okay.  Done.  Thanks 

## 2021-03-04 ENCOUNTER — Telehealth: Payer: Self-pay | Admitting: *Deleted

## 2021-03-04 NOTE — Chronic Care Management (AMB) (Signed)
  Chronic Care Management   Outreach Note  03/04/2021 Name: RAGAN REALE MRN: [id number] DOB: 08-May-1956  DARRYLL RAJU is a 65 y.o. year old male who is a primary care patient of Plotnikov, Georgina Quint, MD. I reached out to Rosezena Sensor by phone today in response to a referral sent by Mr. Esmeralda Blanford Maka's PCP Plotnikov, Georgina Quint, MD     An unsuccessful telephone outreach was attempted today. The patient was referred to the case management team for assistance with care management and care coordination.   Follow Up Plan: A HIPAA compliant phone message was left for the patient providing contact information and requesting a return call.  If patient returns call to provider office, please advise to call Embedded Care Management Care Guide Nekeya Briski at [phone]  Burman Nieves, CCMA Care Guide, Embedded Care Coordination [facility]  Care Management  Direct Dial: [phone]

## 2021-03-14 ENCOUNTER — Other Ambulatory Visit: Payer: Self-pay | Admitting: Internal Medicine

## 2021-03-15 ENCOUNTER — Telehealth: Payer: Self-pay

## 2021-03-15 MED ORDER — HYDROCODONE-ACETAMINOPHEN 5-325 MG PO TABS: 1.0000 | ORAL_TABLET | Freq: Two times a day (BID) | ORAL | 0 refills | Status: DC | PRN

## 2021-03-15 NOTE — Telephone Encounter (Signed)
pt has requested a rx refill for HYDROcodone-acetaminophen (NORCO/VICODIN) 5-325 MG tablet.

## 2021-03-15 NOTE — Telephone Encounter (Signed)
Okay.  Needs office visit every 3 months.  Thanks 

## 2021-03-16 ENCOUNTER — Telehealth: Payer: Self-pay | Admitting: Internal Medicine

## 2021-03-16 DIAGNOSIS — E1165 Type 2 diabetes mellitus with hyperglycemia: Secondary | ICD-10-CM

## 2021-03-16 DIAGNOSIS — E785 Hyperlipidemia, unspecified: Secondary | ICD-10-CM

## 2021-03-16 NOTE — Telephone Encounter (Signed)
Pt is schedule for tomorrow 03/17/21.Marland KitchenRaechel Schmidt

## 2021-03-16 NOTE — Telephone Encounter (Signed)
Per chart Lipid panel hasn't been check since "2015".

## 2021-03-16 NOTE — Telephone Encounter (Signed)
[facility]) calling & requesting to see if statin is appropriate for patient  Callback # [phone]

## 2021-03-17 ENCOUNTER — Other Ambulatory Visit: Payer: Self-pay

## 2021-03-17 ENCOUNTER — Encounter: Payer: Self-pay | Admitting: Internal Medicine

## 2021-03-17 ENCOUNTER — Ambulatory Visit (INDEPENDENT_AMBULATORY_CARE_PROVIDER_SITE_OTHER): Payer: Medicare HMO | Admitting: Internal Medicine

## 2021-03-17 DIAGNOSIS — G8929 Other chronic pain: Secondary | ICD-10-CM | POA: Diagnosis not present

## 2021-03-17 DIAGNOSIS — F411 Generalized anxiety disorder: Secondary | ICD-10-CM | POA: Diagnosis not present

## 2021-03-17 DIAGNOSIS — M5440 Lumbago with sciatica, unspecified side: Secondary | ICD-10-CM

## 2021-03-17 DIAGNOSIS — I10 Essential (primary) hypertension: Secondary | ICD-10-CM

## 2021-03-17 DIAGNOSIS — M153 Secondary multiple arthritis: Secondary | ICD-10-CM

## 2021-03-17 DIAGNOSIS — E785 Hyperlipidemia, unspecified: Secondary | ICD-10-CM | POA: Diagnosis not present

## 2021-03-17 DIAGNOSIS — E1165 Type 2 diabetes mellitus with hyperglycemia: Secondary | ICD-10-CM

## 2021-03-17 LAB — COMPREHENSIVE METABOLIC PANEL
ALT: 13 U/L (ref 0–53)
AST: 13 U/L (ref 0–37)
Albumin: 4.1 g/dL (ref 3.5–5.2)
Alkaline Phosphatase: 40 U/L (ref 39–117)
BUN: 14 mg/dL (ref 6–23)
CO2: 27 mEq/L (ref 19–32)
Calcium: 9.8 mg/dL (ref 8.4–10.5)
Chloride: 104 mEq/L (ref 96–112)
Creatinine, Ser: 1.34 mg/dL (ref 0.40–1.50)
GFR: 55.82 mL/min — ABNORMAL LOW (ref >60.00)
Glucose, Bld: 97 mg/dL (ref 70–99)
Potassium: 4.1 mEq/L (ref 3.5–5.1)
Sodium: 139 mEq/L (ref 135–145)
Total Bilirubin: 0.3 mg/dL (ref 0.2–1.2)
Total Protein: 7.1 g/dL (ref 6.0–8.3)

## 2021-03-17 LAB — LIPID PANEL
Cholesterol: 178 mg/dL (ref 0–200)
HDL: 42.9 mg/dL (ref >39.00)
NonHDL: 135.01
Total CHOL/HDL Ratio: 4
Triglycerides: 255 mg/dL — ABNORMAL HIGH (ref 0.0–149.0)
VLDL: 51 mg/dL — ABNORMAL HIGH (ref 0.0–40.0)

## 2021-03-17 LAB — LDL CHOLESTEROL, DIRECT: Direct LDL: 59 mg/dL

## 2021-03-17 LAB — HEMOGLOBIN A1C: Hgb A1c MFr Bld: 7.3 % — ABNORMAL HIGH (ref 4.6–6.5)

## 2021-03-17 NOTE — Assessment & Plan Note (Signed)
LS spine, knees

## 2021-03-17 NOTE — Assessment & Plan Note (Signed)
Wellbutrin Potential benefits of a long term wellbutrin use as well as potential risks  and complications were explained to the patient and were aknowledged.

## 2021-03-17 NOTE — Telephone Encounter (Signed)
I would be happy to check at, but the patient has never fasted when he comes here.  I will put an order in  - thanks

## 2021-03-17 NOTE — Addendum Note (Signed)
Addended by: Madelon Lips on: 03/17/2021 03:49 PM   Modules accepted: Orders

## 2021-03-17 NOTE — Assessment & Plan Note (Signed)
Check A1c Cont w/Metformin, Glipizide, Actos

## 2021-03-17 NOTE — Assessment & Plan Note (Signed)
Norco prn  Potential benefits of a long term prn opioids use as well as potential risks (i.e. addiction risk, apnea etc) and complications (i.e. Somnolence, constipation and others) were explained to the patient and were aknowledged.  

## 2021-03-17 NOTE — Progress Notes (Signed)
Subjective:  Patient ID: Timothy Schmidt, male    DOB: 1955/12/08  Age: 65 y.o. MRN: [id number]  CC: Follow-up (3 month follow up )   HPI [name] presents for DM, LBP, caries, HTN  Outpatient Medications Prior to Visit  Medication Sig Dispense Refill   Alcohol Swabs (B-D SINGLE USE SWABS BUTTERFLY) PADS Use one two times daily.Dx: 250.00 100 each 3   Ascorbic Acid (VITAMIN C WITH ROSE HIPS) 1000 MG tablet Take 1,000 mg by mouth daily.     aspirin EC 81 MG tablet Take 81 mg by mouth daily.     Blood Glucose Calibration (PRODIGY CONTROL SOLUTION) LOW SOLN Use as directed per meter instructions. 3 each 0   Blood Glucose Monitoring Suppl (PRODIGY AUTOCODE BLOOD GLUCOSE) W/DEVICE KIT Use two times daily as directed. 1 each 0   cholecalciferol (VITAMIN D) 1000 UNITS tablet Take 1 tablet (1,000 Units total) by mouth daily. 90 tablet 3   cyanocobalamin 1000 MCG tablet Take 0.5 tablets (500 mcg total) by mouth daily. 90 tablet 3   finasteride (PROSCAR) 5 MG tablet Take 1 tablet by mouth once daily 30 tablet 0   glipiZIDE (GLUCOTROL) 5 MG tablet TAKE 1 TABLET BY MOUTH TWICE DAILY BEFORE MEAL(S) 180 tablet 3   glucose blood (PRODIGY AUTOCODE TEST) test strip Use one two times daily. Dx: 250.00 100 each 3   guaiFENesin-Codeine (DIABETIC TUSSIN C PO) Take by mouth as needed.     HYDROcodone-acetaminophen (NORCO/VICODIN) 5-325 MG tablet Take 1 tablet by mouth 2 (two) times daily as needed for severe pain. 60 tablet 0   metFORMIN (GLUCOPHAGE) 1000 MG tablet TAKE 1 TABLET BY MOUTH TWICE DAILY WITH MEALS 180 tablet 3   penicillin v potassium (VEETID) 500 MG tablet 1 po qid 120 tablet 0   pioglitazone (ACTOS) 30 MG tablet Take 1 tablet by mouth once daily 90 tablet 1   potassium chloride SA (KLOR-CON) 20 MEQ tablet Take 1 tablet by mouth once daily 90 tablet 3   PRODIGY TWIST TOP LANCETS 28G MISC Use one two times daily. Dx: 250.00 100 each 3   tadalafil (CIALIS) 5 MG tablet Take 1 tablet (5 mg  total) by mouth daily. 30 tablet 11   triamterene-hydrochlorothiazide (MAXZIDE-25) 37.5-25 MG tablet Take 1 tablet by mouth once daily 90 tablet 0   No facility-administered medications prior to visit.    ROS: Review of Systems  Constitutional:  Negative for appetite change, fatigue and unexpected weight change.  HENT:  Negative for congestion, nosebleeds, sneezing, sore throat and trouble swallowing.   Eyes:  Negative for itching and visual disturbance.  Respiratory:  Negative for cough.   Cardiovascular:  Negative for chest pain, palpitations and leg swelling.  Gastrointestinal:  Negative for abdominal distention, blood in stool, diarrhea and nausea.  Genitourinary:  Negative for frequency and hematuria.  Musculoskeletal:  Positive for back pain. Negative for gait problem, joint swelling and neck pain.  Skin:  Negative for rash.  Neurological:  Negative for dizziness, tremors, speech difficulty and weakness.  Psychiatric/Behavioral:  Negative for agitation, dysphoric mood, sleep disturbance and suicidal ideas. The patient is not nervous/anxious.    Objective:  BP 126/72 (BP Location: Left Arm, Patient Position: Sitting, Cuff Size: Large)   Pulse 94   Temp 98.5 F (36.9 C) (Oral)   Ht 5' 7.5" (1.715 m)   Wt 209 lb (94.8 kg)   SpO2 99%   BMI 32.25 kg/m   BP Readings from Last 3  Encounters:  03/17/21 126/72  11/24/20 122/70  08/04/20 132/76    Wt Readings from Last 3 Encounters:  03/17/21 209 lb (94.8 kg)  11/24/20 210 lb (95.3 kg)  08/04/20 213 lb (96.6 kg)    Physical Exam Constitutional:      General: He is not in acute distress.    Appearance: He is well-developed. He is obese.     Comments: NAD  Eyes:     Conjunctiva/sclera: Conjunctivae normal.     Pupils: Pupils are equal, round, and reactive to light.  Neck:     Thyroid: No thyromegaly.     Vascular: No JVD.  Cardiovascular:     Rate and Rhythm: Normal rate and regular rhythm.     Heart sounds: Normal  heart sounds. No murmur heard.   No friction rub. No gallop.  Pulmonary:     Effort: Pulmonary effort is normal. No respiratory distress.     Breath sounds: Normal breath sounds. No wheezing or rales.  Chest:     Chest wall: No tenderness.  Abdominal:     General: Bowel sounds are normal. There is no distension.     Palpations: Abdomen is soft. There is no mass.     Tenderness: There is no abdominal tenderness. There is no guarding or rebound.  Musculoskeletal:        General: No tenderness. Normal range of motion.     Cervical back: Normal range of motion.  Lymphadenopathy:     Cervical: No cervical adenopathy.  Skin:    General: Skin is warm and dry.     Findings: No rash.  Neurological:     Mental Status: He is alert and oriented to person, place, and time.     Cranial Nerves: No cranial nerve deficit.     Motor: No abnormal muscle tone.     Coordination: Coordination normal.     Gait: Gait normal.     Deep Tendon Reflexes: Reflexes are normal and symmetric.  Psychiatric:        Behavior: Behavior normal.        Thought Content: Thought content normal.        Judgment: Judgment normal.   Umb hernia Caries Lab Results  Component Value Date   WBC 8.8 08/25/2019   HGB 13.3 08/25/2019   HCT 40.8 08/25/2019   PLT 237.0 08/25/2019   GLUCOSE 109 (H) 11/24/2020   CHOL 156 01/02/2014   TRIG 265.0 (H) 01/02/2014   HDL 44.40 01/02/2014   LDLDIRECT 71.5 06/29/2010   LDLCALC 59 01/02/2014   ALT 11 11/24/2020   AST 11 11/24/2020   NA 141 11/24/2020   K 4.0 11/24/2020   CL 104 11/24/2020   CREATININE 1.27 11/24/2020   BUN 13 11/24/2020   CO2 30 11/24/2020   TSH 1.31 01/01/2018   PSA 12.54 (H) 11/24/2020   HGBA1C 7.3 (H) 11/24/2020   MICROALBUR 0.7 11/24/2016    DG Chest 2 View  Result Date: 09/22/2010 *RADIOLOGY REPORT* Clinical Data: Shoulder spasm was obtained. CHEST - 2 VIEW Comparison: 08/05/2009 Findings: Normal heart size.  Linear scar at the left base.  Right  lung is clear.  No pneumothorax or pleural effusion.  No acute bony deformity. IMPRESSION: No active cardiopulmonary disease. Original Report Authenticated By: Jamas Lav, M.D.  DG Shoulder Left  Result Date: 09/22/2010 *RADIOLOGY REPORT* Clinical Data: Shoulder spasm was and pain LEFT SHOULDER - 2+ VIEW Comparison: None. Findings: No acute fracture.  No dislocation.  Unremarkable soft tissues.  IMPRESSION: No acute bony pathology. Original Report Authenticated By: Jamas Lav, M.D.   Assessment & Plan:        Follow-up: Return in about 3 months (around 06/17/2021) for a follow-up visit.  Walker Kehr, MD

## 2021-03-17 NOTE — Assessment & Plan Note (Signed)
Triamt/HCT 

## 2021-03-18 LAB — PMP SCREEN PROFILE (10S), URINE
Amphetamine Scrn, Ur: NEGATIVE ng/mL
BARBITURATE SCREEN URINE: NEGATIVE ng/mL
BENZODIAZEPINE SCREEN, URINE: NEGATIVE ng/mL
CANNABINOIDS UR QL SCN: NEGATIVE ng/mL
Cocaine (Metab) Scrn, Ur: NEGATIVE ng/mL
Creatinine(Crt), U: 71.6 mg/dL (ref 20.0–300.0)
Methadone Screen, Urine: NEGATIVE ng/mL
OXYCODONE+OXYMORPHONE UR QL SCN: NEGATIVE ng/mL
Opiate Scrn, Ur: POSITIVE ng/mL — AB
Ph of Urine: 5.2 (ref 4.5–8.9)
Phencyclidine Qn, Ur: NEGATIVE ng/mL
Propoxyphene Scrn, Ur: NEGATIVE ng/mL

## 2021-03-18 NOTE — Chronic Care Management (AMB) (Signed)
  Chronic Care Management   Outreach Note  03/18/2021 Name: REGGINALD PASK MRN: [id number] DOB: 08-28-1955  THORNTON DOHRMANN is a 65 y.o. year old male who is a primary care patient of Plotnikov, Georgina Quint, MD. I reached out to Rosezena Sensor by phone today in response to a referral sent by Mr. Viyan Rosamond Stepp's PCP Plotnikov, Georgina Quint, MD     A second unsuccessful telephone outreach was attempted today. The patient was referred to the case management team for assistance with care management and care coordination.   Follow Up Plan: If patient returns call to provider office, please advise to call Embedded Care Management Care Guide Arien Morine at [phone]  Burman Nieves, CCMA Care Guide, Embedded Care Coordination [facility]  Care Management  Direct Dial: [phone]

## 2021-03-21 ENCOUNTER — Other Ambulatory Visit: Payer: Self-pay | Admitting: Internal Medicine

## 2021-03-24 NOTE — Chronic Care Management (AMB) (Signed)
  Chronic Care Management   Outreach Note  03/24/2021 Name: [name] MRN: [id number] DOB: 1956/04/10  Timothy Schmidt is a 65 y.o. year old male who is a primary care patient of Plotnikov, Georgina Quint, MD. I reached out to Timothy Schmidt by phone today in response to a referral sent by Mr. Rossie Bretado Chenault's PCP Plotnikov, Georgina Quint, MD     Third unsuccessful telephone outreach was attempted today.The care management team is pleased to engage with this patient at any time in the future should he/she be interested in assistance from the care management team.   Follow Up Plan: We have been unable to make contact with the patient for follow up.   Burman Nieves, CCMA Care Guide, Embedded Care Coordination [facility]  Care Management  Direct Dial: [phone]

## 2021-03-28 ENCOUNTER — Telehealth: Payer: Self-pay | Admitting: Internal Medicine

## 2021-03-28 MED ORDER — GLIPIZIDE 5 MG PO TABS: ORAL_TABLET | ORAL | 3 refills | Status: DC

## 2021-03-28 MED ORDER — TRIAMTERENE-HCTZ 37.5-25 MG PO TABS: 1.0000 | ORAL_TABLET | Freq: Every day | ORAL | 3 refills | Status: DC

## 2021-03-28 MED ORDER — METFORMIN HCL 1000 MG PO TABS: 1000.0000 mg | ORAL_TABLET | Freq: Two times a day (BID) | ORAL | 3 refills | Status: DC

## 2021-03-28 NOTE — Telephone Encounter (Signed)
    Patient requesting refill for penicillin v potassium (VEETID) 500 MG tablet  gliipiZIDE (GLUCOTROL) 5 MG tablet  metFORMIN (GLUCOPHAGE) 1000 MG tablet  Pharmacy [facility] - [place], [place] - [address]

## 2021-03-28 NOTE — Telephone Encounter (Signed)
Follow up message    Patient calling back, states if there are any questions about the penicillin please call him. He states it is for dental issues.  Advised patient Metformin has been sent

## 2021-03-28 NOTE — Telephone Encounter (Signed)
Maintenance meds been sent.Marland Kitchenlmb

## 2021-03-29 MED ORDER — PENICILLIN V POTASSIUM 500 MG PO TABS: ORAL_TABLET | ORAL | 0 refills | Status: DC

## 2021-03-29 MED ORDER — GLIPIZIDE 5 MG PO TABS: ORAL_TABLET | ORAL | 3 refills | Status: DC

## 2021-03-29 MED ORDER — METFORMIN HCL 1000 MG PO TABS: 1000.0000 mg | ORAL_TABLET | Freq: Two times a day (BID) | ORAL | 3 refills | Status: DC

## 2021-03-29 NOTE — Addendum Note (Signed)
Addended by: Tresa Garter on: 03/29/2021 07:19 AM   Modules accepted: Orders

## 2021-03-29 NOTE — Telephone Encounter (Signed)
Okay.  Thanks.

## 2021-04-21 ENCOUNTER — Telehealth: Payer: Self-pay | Admitting: Internal Medicine

## 2021-04-21 NOTE — Telephone Encounter (Signed)
   Patient requesting refill for HYDROcodone-acetaminophen (NORCO/VICODIN) 5-325 MG tablet   Pharmacy [facility] - [place], [place] - [address]

## 2021-04-22 MED ORDER — HYDROCODONE-ACETAMINOPHEN 5-325 MG PO TABS: 1.0000 | ORAL_TABLET | Freq: Two times a day (BID) | ORAL | 0 refills | Status: DC | PRN

## 2021-04-22 NOTE — Telephone Encounter (Signed)
Okay.  Done.  Thanks 

## 2021-05-16 ENCOUNTER — Telehealth: Payer: Self-pay | Admitting: Internal Medicine

## 2021-05-16 NOTE — Telephone Encounter (Signed)
1.Medication Requested: HYDROcodone-acetaminophen (NORCO/VICODIN) 5-325 MG tablet  2. Pharmacy (Name, Street, [place]): [facility] - [place], [place] - [address]  Phone:  [phone] Fax:  [phone]   3. On Med List: yes  4. Last Visit with PCP: 07.28.22  5. Next visit date with PCP: 11.01.22   Agent: Please be advised that RX refills may take up to 3 business days. We ask that you follow-up with your pharmacy.

## 2021-05-17 MED ORDER — HYDROCODONE-ACETAMINOPHEN 5-325 MG PO TABS: 1.0000 | ORAL_TABLET | Freq: Two times a day (BID) | ORAL | 0 refills | Status: DC | PRN

## 2021-05-17 NOTE — Telephone Encounter (Signed)
Ok Thx 

## 2021-05-23 ENCOUNTER — Telehealth: Payer: Self-pay | Admitting: Internal Medicine

## 2021-05-23 NOTE — Telephone Encounter (Signed)
See below

## 2021-05-23 NOTE — Telephone Encounter (Signed)
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 Posey Rea does not have any availability until the week of 10.17.22, patient did not want to wait.  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

## 2021-05-24 ENCOUNTER — Ambulatory Visit: Payer: Medicare HMO | Admitting: Internal Medicine

## 2021-05-27 NOTE — Telephone Encounter (Signed)
We can try to work him in next week if needed.  Thanks

## 2021-05-27 NOTE — Telephone Encounter (Signed)
See below

## 2021-05-31 ENCOUNTER — Ambulatory Visit (INDEPENDENT_AMBULATORY_CARE_PROVIDER_SITE_OTHER): Payer: Medicare HMO | Admitting: Internal Medicine

## 2021-05-31 ENCOUNTER — Encounter: Payer: Self-pay | Admitting: Internal Medicine

## 2021-05-31 ENCOUNTER — Other Ambulatory Visit: Payer: Self-pay

## 2021-05-31 VITALS — BP 132/80 | HR 108 | Temp 98.8°F | Ht 67.5 in | Wt 208.8 lb

## 2021-05-31 DIAGNOSIS — G8929 Other chronic pain: Secondary | ICD-10-CM | POA: Diagnosis not present

## 2021-05-31 DIAGNOSIS — R4189 Other symptoms and signs involving cognitive functions and awareness: Secondary | ICD-10-CM

## 2021-05-31 DIAGNOSIS — E1159 Type 2 diabetes mellitus with other circulatory complications: Secondary | ICD-10-CM | POA: Diagnosis not present

## 2021-05-31 DIAGNOSIS — M5440 Lumbago with sciatica, unspecified side: Secondary | ICD-10-CM | POA: Diagnosis not present

## 2021-05-31 DIAGNOSIS — K029 Dental caries, unspecified: Secondary | ICD-10-CM

## 2021-05-31 DIAGNOSIS — E538 Deficiency of other specified B group vitamins: Secondary | ICD-10-CM

## 2021-05-31 LAB — COMPREHENSIVE METABOLIC PANEL
ALT: 11 U/L (ref 0–53)
AST: 14 U/L (ref 0–37)
Albumin: 4.2 g/dL (ref 3.5–5.2)
Alkaline Phosphatase: 44 U/L (ref 39–117)
BUN: 17 mg/dL (ref 6–23)
CO2: 28 mEq/L (ref 19–32)
Calcium: 10 mg/dL (ref 8.4–10.5)
Chloride: 104 mEq/L (ref 96–112)
Creatinine, Ser: 1.38 mg/dL (ref 0.40–1.50)
GFR: 53.8 mL/min — ABNORMAL LOW (ref >60.00)
Glucose, Bld: 119 mg/dL — ABNORMAL HIGH (ref 70–99)
Potassium: 3.9 mEq/L (ref 3.5–5.1)
Sodium: 141 mEq/L (ref 135–145)
Total Bilirubin: 0.3 mg/dL (ref 0.2–1.2)
Total Protein: 7.1 g/dL (ref 6.0–8.3)

## 2021-05-31 LAB — HEMOGLOBIN A1C: Hgb A1c MFr Bld: 7.2 % — ABNORMAL HIGH (ref 4.6–6.5)

## 2021-05-31 NOTE — Progress Notes (Signed)
Subjective:  Patient ID: Timothy Schmidt, male    DOB: 04-Oct-1955  Age: 65 y.o. MRN: [id number]  CC: Follow-up   HPI Timothy Schmidt presents for stress - he is in bankruptcy now.  Follow-up on diabetes, low back pain, caries.  Outpatient Medications Prior to Visit  Medication Sig Dispense Refill   Alcohol Swabs (B-D SINGLE USE SWABS BUTTERFLY) PADS Use one two times daily.Dx: 250.00 100 each 3   Ascorbic Acid (VITAMIN C WITH ROSE HIPS) 1000 MG tablet Take 1,000 mg by mouth daily.     aspirin EC 81 MG tablet Take 81 mg by mouth daily.     Blood Glucose Calibration (PRODIGY CONTROL SOLUTION) LOW SOLN Use as directed per meter instructions. 3 each 0   Blood Glucose Monitoring Suppl (PRODIGY AUTOCODE BLOOD GLUCOSE) W/DEVICE KIT Use two times daily as directed. 1 each 0   cholecalciferol (VITAMIN D) 1000 UNITS tablet Take 1 tablet (1,000 Units total) by mouth daily. 90 tablet 3   cyanocobalamin 1000 MCG tablet Take 0.5 tablets (500 mcg total) by mouth daily. 90 tablet 3   finasteride (PROSCAR) 5 MG tablet Take 1 tablet by mouth once daily 30 tablet 0   glipiZIDE (GLUCOTROL) 5 MG tablet TAKE 1 TABLET BY MOUTH TWICE DAILY BEFORE MEAL(S) 180 tablet 3   glucose blood (PRODIGY AUTOCODE TEST) test strip Use one two times daily. Dx: 250.00 100 each 3   guaiFENesin-Codeine (DIABETIC TUSSIN C PO) Take by mouth as needed.     HYDROcodone-acetaminophen (NORCO/VICODIN) 5-325 MG tablet Take 1 tablet by mouth 2 (two) times daily as needed for severe pain. 60 tablet 0   metFORMIN (GLUCOPHAGE) 1000 MG tablet Take 1 tablet (1,000 mg total) by mouth 2 (two) times daily with a meal. 180 tablet 3   pioglitazone (ACTOS) 30 MG tablet Take 1 tablet by mouth once daily 90 tablet 1   potassium chloride SA (KLOR-CON) 20 MEQ tablet Take 1 tablet by mouth once daily 90 tablet 3   PRODIGY TWIST TOP LANCETS 28G MISC Use one two times daily. Dx: 250.00 100 each 3   tadalafil (CIALIS) 5 MG tablet Take 1 tablet (5 mg  total) by mouth daily. 30 tablet 11   triamterene-hydrochlorothiazide (MAXZIDE-25) 37.5-25 MG tablet Take 1 tablet by mouth daily. 90 tablet 3   penicillin v potassium (VEETID) 500 MG tablet 1 po qid (Patient not taking: Reported on 05/31/2021) 120 tablet 0   No facility-administered medications prior to visit.    ROS: Review of Systems  Constitutional:  Negative for appetite change, fatigue and unexpected weight change.  HENT:  Negative for congestion, nosebleeds, sneezing, sore throat and trouble swallowing.   Eyes:  Negative for itching and visual disturbance.  Respiratory:  Negative for cough.   Cardiovascular:  Negative for chest pain, palpitations and leg swelling.  Gastrointestinal:  Negative for abdominal distention, blood in stool, diarrhea and nausea.  Genitourinary:  Negative for frequency and hematuria.  Musculoskeletal:  Positive for back pain. Negative for gait problem, joint swelling and neck pain.  Skin:  Negative for rash.  Neurological:  Negative for dizziness, tremors, speech difficulty and weakness.  Psychiatric/Behavioral:  Positive for decreased concentration. Negative for agitation, dysphoric mood and sleep disturbance. The patient is nervous/anxious.    Objective:  BP 132/80 (BP Location: Left Arm)   Pulse (!) 108   Temp 98.8 F (37.1 C) (Oral)   Ht 5' 7.5" (1.715 m)   Wt 208 lb 12.8 oz (94.7 kg)  SpO2 97%   BMI 32.22 kg/m   BP Readings from Last 3 Encounters:  05/31/21 132/80  03/17/21 126/72  11/24/20 122/70    Wt Readings from Last 3 Encounters:  05/31/21 208 lb 12.8 oz (94.7 kg)  03/17/21 209 lb (94.8 kg)  11/24/20 210 lb (95.3 kg)    Physical Exam Constitutional:      General: He is not in acute distress.    Appearance: He is well-developed. He is obese.     Comments: NAD  Eyes:     Conjunctiva/sclera: Conjunctivae normal.     Pupils: Pupils are equal, round, and reactive to light.  Neck:     Thyroid: No thyromegaly.     Vascular: No  JVD.  Cardiovascular:     Rate and Rhythm: Normal rate and regular rhythm.     Heart sounds: Normal heart sounds. No murmur heard.   No friction rub. No gallop.  Pulmonary:     Effort: Pulmonary effort is normal. No respiratory distress.     Breath sounds: Normal breath sounds. No wheezing or rales.  Chest:     Chest wall: No tenderness.  Abdominal:     General: Bowel sounds are normal. There is no distension.     Palpations: Abdomen is soft. There is no mass.     Tenderness: There is no abdominal tenderness. There is no guarding or rebound.  Musculoskeletal:        General: Tenderness present. Normal range of motion.     Cervical back: Normal range of motion.  Lymphadenopathy:     Cervical: No cervical adenopathy.  Skin:    General: Skin is warm and dry.     Findings: No rash.  Neurological:     Mental Status: He is alert and oriented to person, place, and time.     Cranial Nerves: No cranial nerve deficit.     Motor: No abnormal muscle tone.     Coordination: Coordination normal.     Gait: Gait normal.     Deep Tendon Reflexes: Reflexes are normal and symmetric.  Psychiatric:        Behavior: Behavior normal.        Thought Content: Thought content normal.        Judgment: Judgment normal.  LS spine with pain Teeth with caries  Lab Results  Component Value Date   WBC 8.8 08/25/2019   HGB 13.3 08/25/2019   HCT 40.8 08/25/2019   PLT 237.0 08/25/2019   GLUCOSE 119 (H) 05/31/2021   CHOL 178 03/17/2021   TRIG 255.0 (H) 03/17/2021   HDL 42.90 03/17/2021   LDLDIRECT 59.0 03/17/2021   LDLCALC 59 01/02/2014   ALT 11 05/31/2021   AST 14 05/31/2021   NA 141 05/31/2021   K 3.9 05/31/2021   CL 104 05/31/2021   CREATININE 1.38 05/31/2021   BUN 17 05/31/2021   CO2 28 05/31/2021   TSH 1.31 01/01/2018   PSA 12.54 (H) 11/24/2020   HGBA1C 7.2 (H) 05/31/2021   MICROALBUR 0.7 11/24/2016    DG Chest 2 View  Result Date: 09/22/2010 *RADIOLOGY REPORT* Clinical Data: Shoulder  spasm was obtained. CHEST - 2 VIEW Comparison: 08/05/2009 Findings: Normal heart size.  Linear scar at the left base.  Right lung is clear.  No pneumothorax or pleural effusion.  No acute bony deformity. IMPRESSION: No active cardiopulmonary disease. Original Report Authenticated By: Jamas Lav, M.D.  DG Shoulder Left  Result Date: 09/22/2010 *RADIOLOGY REPORT* Clinical Data: Shoulder spasm was  and pain LEFT SHOULDER - 2+ VIEW Comparison: None. Findings: No acute fracture.  No dislocation.  Unremarkable soft tissues. IMPRESSION: No acute bony pathology. Original Report Authenticated By: Jamas Lav, M.D.   Assessment & Plan:   Problem List Items Addressed This Visit     B12 deficiency    On B12      Caries    We will have to use antibiotics periodically.  Cian is still looking for options to fix his teeth.      Cognitive deficits    [place] needs a letter to the judge: Dear Jonette Mate, Mr. Meyn has been a patient of mine for many years.  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  Due to Mr. Pitta mental deficiency and incompetency it would be prudent to appoint a guardian for Mr. Middlebrooks to represent him in your court.      LOW BACK PAIN    Norco prn  Potential benefits of a long term prn opioids use as well as potential risks (i.e. addiction risk, apnea etc) and complications (i.e. Somnolence, constipation and others) were explained to the patient and were aknowledged.      Type 2 diabetes mellitus (HCC) - Primary    Cont w/Metformin, Glipizide, Actos Monitor A1c      Relevant Orders   Comprehensive metabolic panel (Completed)   Hemoglobin A1c (Completed)      Follow-up: No follow-ups on file.  Walker Kehr, MD

## 2021-05-31 NOTE — Assessment & Plan Note (Signed)
On B12 

## 2021-05-31 NOTE — Assessment & Plan Note (Signed)
Cont w/Metformin, Glipizide, Actos Monitor A1c

## 2021-05-31 NOTE — Assessment & Plan Note (Addendum)
[place] needs a letter to the judge: Dear Tenna Child, Mr. Balfour has been a patient of mine for many years.  Mr. Keil is suffering with a cognitive disability.  His IQ is 64 or 65.  Due to Mr. Fillinger mental deficiency and incompetency it would be prudent to appoint a guardian for Mr. Dobratz to represent him in your court.

## 2021-06-01 LAB — PMP SCREEN PROFILE (10S), URINE
Amphetamine Scrn, Ur: NEGATIVE ng/mL
BARBITURATE SCREEN URINE: NEGATIVE ng/mL
BENZODIAZEPINE SCREEN, URINE: NEGATIVE ng/mL
CANNABINOIDS UR QL SCN: NEGATIVE ng/mL
Cocaine (Metab) Scrn, Ur: NEGATIVE ng/mL
Creatinine(Crt), U: 104.4 mg/dL (ref 20.0–300.0)
Methadone Screen, Urine: NEGATIVE ng/mL
OXYCODONE+OXYMORPHONE UR QL SCN: NEGATIVE ng/mL
Opiate Scrn, Ur: NEGATIVE ng/mL
Ph of Urine: 5.1 (ref 4.5–8.9)
Phencyclidine Qn, Ur: NEGATIVE ng/mL
Propoxyphene Scrn, Ur: NEGATIVE ng/mL

## 2021-06-07 ENCOUNTER — Ambulatory Visit: Payer: Medicare HMO | Admitting: Internal Medicine

## 2021-06-07 ENCOUNTER — Telehealth: Payer: Self-pay | Admitting: Internal Medicine

## 2021-06-07 NOTE — Telephone Encounter (Signed)
Patient is wanting nurse to call regarding previous test results.Marland Kitchen also wants to know if letter has been sent out from provider  Please call [phone]

## 2021-06-08 ENCOUNTER — Telehealth: Payer: Self-pay | Admitting: Internal Medicine

## 2021-06-08 NOTE — Telephone Encounter (Signed)
Patient calling back about letter needing to be sent from provider  I advised patient per previous message that nurse has routed all information to provider & is awaiting a provider response before calling patient back

## 2021-06-08 NOTE — Telephone Encounter (Signed)
Pt return call back he states MD suppose to write a letter to the judge. He wouldn't give anymore detail. He states MD made copy's of what he needed, and the letter need to go out by tomorrow. The judge has to have it by the 25th.Marland KitchenRaechel Chute

## 2021-06-08 NOTE — Telephone Encounter (Signed)
Pt. Has called and is requesting Dr. Posey Rea assistant to call him about a letter he is supposed to receive. Pt. Declined to give any other details about letter. Please advise.    Callback #- [phone]

## 2021-06-08 NOTE — Telephone Encounter (Signed)
Patient says he is still waiting for nurse to call him back w/ last results  Please call [phone]

## 2021-06-08 NOTE — Telephone Encounter (Signed)
Called pt there was no answer LMOM RTC...lmb 

## 2021-06-08 NOTE — Telephone Encounter (Signed)
DUPLIATE MSG'S PLS SEE PREVIOUS MSG..Called pt no answer LMOM rx ready for pick-up.../lmb pt several times concerning lab results there was no answer...[name]

## 2021-06-13 ENCOUNTER — Encounter: Payer: Self-pay | Admitting: Internal Medicine

## 2021-06-13 NOTE — Telephone Encounter (Signed)
Done. Thanks.

## 2021-06-13 NOTE — Telephone Encounter (Signed)
See previous msg called pt to pick-up letter.Marland KitchenRaechel Chute

## 2021-06-13 NOTE — Assessment & Plan Note (Signed)
Norco prn  Potential benefits of a long term prn opioids use as well as potential risks (i.e. addiction risk, apnea etc) and complications (i.e. Somnolence, constipation and others) were explained to the patient and were aknowledged.  

## 2021-06-13 NOTE — Assessment & Plan Note (Signed)
We will have to use antibiotics periodically.  Timothy Schmidt is still looking for options to fix his teeth.

## 2021-06-13 NOTE — Telephone Encounter (Signed)
Called pt there was no answer [facility] letter is ready for pick-up.Marland KitchenRaechel Schmidt

## 2021-06-15 ENCOUNTER — Telehealth: Payer: Self-pay | Admitting: Internal Medicine

## 2021-06-15 NOTE — Telephone Encounter (Signed)
While on the phone with patient scheduling his AWV, patient asked if a nurse could put in an order for a refill on his pain medication Hydrocodone. I advised patient that I would send the nurse a message and someone would contact him in regards to this.

## 2021-06-16 MED ORDER — HYDROCODONE-ACETAMINOPHEN 5-325 MG PO TABS: 1.0000 | ORAL_TABLET | Freq: Two times a day (BID) | ORAL | 0 refills | Status: DC | PRN

## 2021-06-16 NOTE — Telephone Encounter (Signed)
Ok Done Thx 

## 2021-06-21 ENCOUNTER — Ambulatory Visit: Payer: Medicare HMO | Admitting: Internal Medicine

## 2021-06-22 ENCOUNTER — Ambulatory Visit: Payer: Medicare HMO

## 2021-06-27 ENCOUNTER — Ambulatory Visit (INDEPENDENT_AMBULATORY_CARE_PROVIDER_SITE_OTHER): Payer: Medicare HMO

## 2021-06-27 DIAGNOSIS — Z0000 Encounter for general adult medical examination without abnormal findings: Secondary | ICD-10-CM | POA: Diagnosis not present

## 2021-06-27 NOTE — Telephone Encounter (Signed)
Called no answer [facility] letter been ready for pick-up. If we mail letter it may take some days before him to received due to our mail turn around system.Marland KitchenRaechel Chute

## 2021-06-27 NOTE — Telephone Encounter (Signed)
Pt. Is requesting an update on letter to judge. Wants to know if it was mailed out.

## 2021-06-27 NOTE — Progress Notes (Addendum)
I connected with Timothy Schmidt today by telephone and verified that I am speaking with the correct person using two identifiers. Location patient: home Location provider: work Persons participating in the virtual visit: patient, provider.   I discussed the limitations, risks, security and privacy concerns of performing an evaluation and management service by telephone and the availability of in person appointments. I also discussed with the patient that there may be a patient responsible charge related to this service. The patient expressed understanding and verbally consented to this telephonic visit.    Interactive audio and video telecommunications were attempted between this provider and patient, however failed, due to patient having technical difficulties OR patient did not have access to video capability.  We continued and completed visit with audio only.  Some vital signs may be absent or patient reported.   Time Spent with patient on telephone encounter: 40 minutes  Subjective:   Timothy Schmidt is a 65 y.o. male who presents for Medicare Annual/Subsequent preventive examination.  Review of Systems     Cardiac Risk Factors include: advanced age (>62men, >20 women);diabetes mellitus;hypertension;family history of premature cardiovascular disease;male gender;sedentary lifestyle     Objective:    There were no vitals filed for this visit. There is no height or weight on file to calculate BMI.  Advanced Directives 06/27/2021  Does Patient Have a Medical Advance Directive? No  Would patient like information on creating a medical advance directive? No - Patient declined    Current Medications (verified) Outpatient Encounter Medications as of 06/27/2021  Medication Sig   Alcohol Swabs (B-D SINGLE USE SWABS BUTTERFLY) PADS Use one two times daily.Dx: 250.00   Ascorbic Acid (VITAMIN C WITH ROSE HIPS) 1000 MG tablet Take 1,000 mg by mouth daily.   aspirin EC 81 MG tablet Take 81  mg by mouth daily.   Blood Glucose Calibration (PRODIGY CONTROL SOLUTION) LOW SOLN Use as directed per meter instructions.   Blood Glucose Monitoring Suppl (PRODIGY AUTOCODE BLOOD GLUCOSE) W/DEVICE KIT Use two times daily as directed.   cholecalciferol (VITAMIN D) 1000 UNITS tablet Take 1 tablet (1,000 Units total) by mouth daily.   cyanocobalamin 1000 MCG tablet Take 0.5 tablets (500 mcg total) by mouth daily.   finasteride (PROSCAR) 5 MG tablet Take 1 tablet by mouth once daily   glipiZIDE (GLUCOTROL) 5 MG tablet TAKE 1 TABLET BY MOUTH TWICE DAILY BEFORE MEAL(S)   glucose blood (PRODIGY AUTOCODE TEST) test strip Use one two times daily. Dx: 250.00   guaiFENesin-Codeine (DIABETIC TUSSIN C PO) Take by mouth as needed.   HYDROcodone-acetaminophen (NORCO/VICODIN) 5-325 MG tablet Take 1 tablet by mouth 2 (two) times daily as needed for severe pain.   metFORMIN (GLUCOPHAGE) 1000 MG tablet Take 1 tablet (1,000 mg total) by mouth 2 (two) times daily with a meal.   pioglitazone (ACTOS) 30 MG tablet Take 1 tablet by mouth once daily   potassium chloride SA (KLOR-CON) 20 MEQ tablet Take 1 tablet by mouth once daily   PRODIGY TWIST TOP LANCETS 28G MISC Use one two times daily. Dx: 250.00   tadalafil (CIALIS) 5 MG tablet Take 1 tablet (5 mg total) by mouth daily.   triamterene-hydrochlorothiazide (MAXZIDE-25) 37.5-25 MG tablet Take 1 tablet by mouth daily.   No facility-administered encounter medications on file as of 06/27/2021.    Allergies (verified) Patient has no known allergies.   History: Past Medical History:  Diagnosis Date   Anxiety    BPH (benign prostatic hypertrophy)    Caries  Depression    GERD (gastroesophageal reflux disease)    Hernia    right   Hypertension    LBP (low back pain)    OA (osteoarthritis)    Type II or unspecified type diabetes mellitus without mention of complication, not stated as uncontrolled    Vitamin B 12 deficiency 2009   Past Surgical History:   Procedure Laterality Date   HERNIA REPAIR     Family History  Problem Relation Age of Onset   Hypertension Other    Stroke Other    Dementia Other    Mental illness Mother        dementia   Cancer Mother        pancreatic   Hypertension Father    Social History   Socioeconomic History   Marital status: Single    Spouse name: Not on file   Number of children: Not on file   Years of education: Not on file   Highest education level: Not on file  Occupational History   Not on file  Tobacco Use   Smoking status: Every Day   Smokeless tobacco: Current   Tobacco comments:    tobacco  Substance and Sexual Activity   Alcohol use: No   Drug use: No   Sexual activity: Not Currently  Other Topics Concern   Not on file  Social History Narrative   Regular exercise- No   Social Determinants of Health   Financial Resource Strain: Low Risk    Difficulty of Paying Living Expenses: Not hard at all  Food Insecurity: No Food Insecurity   Worried About Charity fundraiser in the Last Year: Never true   Ran Out of Food in the Last Year: Never true  Transportation Needs: No Transportation Needs   Lack of Transportation (Medical): No   Lack of Transportation (Non-Medical): No  Physical Activity: Inactive   Days of Exercise per Week: 0 days   Minutes of Exercise per Session: 0 min  Stress: No Stress Concern Present   Feeling of Stress : Not at all  Social Connections: Moderately Integrated   Frequency of Communication with Friends and Family: More than three times a week   Frequency of Social Gatherings with Friends and Family: Three times a week   Attends Religious Services: More than 4 times per year   Active Member of Clubs or Organizations: Yes   Attends Music therapist: More than 4 times per year   Marital Status: Never married    Tobacco Counseling Ready to quit: Not Answered Counseling given: Not Answered Tobacco comments: tobacco   Clinical  Intake:  Pre-visit preparation completed: Yes  Pain : No/denies pain     Nutritional Risks: None Diabetes: Yes CBG done?: No Did pt. bring in CBG monitor from home?: No  How often do you need to have someone help you when you read instructions, pamphlets, or other written materials from your doctor or pharmacy?: 1 - Never What is the last grade level you completed in school?: [employer]  Diabetic? yes  Interpreter Needed?: No  Information entered by :: Lisette Abu, LPN   Activities of Daily Living In your present state of health, do you have any difficulty performing the following activities: 06/27/2021  Hearing? N  Vision? N  Difficulty concentrating or making decisions? Y  Walking or climbing stairs? N  Dressing or bathing? N  Doing errands, shopping? N  Preparing Food and eating ? N  Using the  Toilet? N  In the past six months, have you accidently leaked urine? N  Do you have problems with loss of bowel control? N  Managing your Medications? N  Managing your Finances? N  Housekeeping or managing your Housekeeping? N  Some recent data might be hidden    Patient Care Team: Plotnikov, Evie Lacks, MD as PCP - General McNatt, Remo Lipps, MD as Physician Assistant (Surgery)  Indicate any recent Medical Services you may have received from other than [facility] providers in the past year (date may be approximate).     Assessment:   This is a routine wellness examination for [place].  Hearing/Vision screen Hearing Screening - Comments:: Patient denied any hearing difficulty. No hearing aids needed. Vision Screening - Comments:: No issues with vision per patient.  Dietary issues and exercise activities discussed: Current Exercise Habits: The patient does not participate in regular exercise at present, Exercise limited by: orthopedic condition(s)   Goals Addressed   None   Depression Screen PHQ 2/9 Scores 06/27/2021 08/04/2020  PHQ - 2 Score  0 0    Fall Risk Fall Risk  06/27/2021  Falls in the past year? 1  Number falls in past yr: 0  Injury with Fall? 1    FALL RISK PREVENTION PERTAINING TO THE HOME:  Any stairs in or around the home? No  If so, are there any without handrails? No  Home free of loose throw rugs in walkways, pet beds, electrical cords, etc? Yes  Adequate lighting in your home to reduce risk of falls? Yes   ASSISTIVE DEVICES UTILIZED TO PREVENT FALLS:  Life alert? No  Use of a cane, walker or w/c? No  Grab bars in the bathroom? No  Shower chair or bench in shower? No  Elevated toilet seat or a handicapped toilet? No   TIMED UP AND GO:  Was the test performed? No .  Length of time to ambulate 10 feet: n/a sec.   Gait steady and fast without use of assistive device  Cognitive Function: Patient has current diagnosis of cognitive impairment.         Immunizations There is no immunization history for the selected administration types on file for this patient.  TDAP status: Due, Education has been provided regarding the importance of this vaccine. Advised may receive this vaccine at local pharmacy or Health Dept. Aware to provide a copy of the vaccination record if obtained from local pharmacy or Health Dept. Verbalized acceptance and understanding.  Flu Vaccine status: Declined, Education has been provided regarding the importance of this vaccine but patient still declined. Advised may receive this vaccine at local pharmacy or Health Dept. Aware to provide a copy of the vaccination record if obtained from local pharmacy or Health Dept. Verbalized acceptance and understanding.  Pneumococcal vaccine status: Declined,  Education has been provided regarding the importance of this vaccine but patient still declined. Advised may receive this vaccine at local pharmacy or Health Dept. Aware to provide a copy of the vaccination record if obtained from local pharmacy or Health Dept. Verbalized acceptance and  understanding.   Covid-19 vaccine status: Declined, Education has been provided regarding the importance of this vaccine but patient still declined. Advised may receive this vaccine at local pharmacy or Health Dept.or vaccine clinic. Aware to provide a copy of the vaccination record if obtained from local pharmacy or Health Dept. Verbalized acceptance and understanding.  Qualifies for Shingles Vaccine? Yes   Zostavax completed No   Shingrix Completed?: No.  Education has been provided regarding the importance of this vaccine. Patient has been advised to call insurance company to determine out of pocket expense if they have not yet received this vaccine. Advised may also receive vaccine at local pharmacy or Health Dept. Verbalized acceptance and understanding.  Screening Tests Health Maintenance  Topic Date Due   COVID-19 Vaccine (1) Never done   Pneumonia Vaccine 71+ Years old (1 - PCV) Never done   OPHTHALMOLOGY EXAM  Never done   HIV Screening  Never done   Hepatitis C Screening  Never done   COLONOSCOPY (Pts 45-39yr Insurance coverage will need to be confirmed)  Never done   Zoster Vaccines- Shingrix (1 of 2) Never done   FOOT EXAM  04/14/2017   URINE MICROALBUMIN  11/24/2017   TETANUS/TDAP  11/24/2021 (Originally 04/13/1975)   HEMOGLOBIN A1C  11/29/2021   HPV VACCINES  Aged Out   INFLUENZA VACCINE  Discontinued    Health Maintenance  Health Maintenance Due  Topic Date Due   COVID-19 Vaccine (1) Never done   Pneumonia Vaccine 65 Years old (1 - PCV) Never done   OPHTHALMOLOGY EXAM  Never done   HIV Screening  Never done   Hepatitis C Screening  Never done   COLONOSCOPY (Pts 45-465yrInsurance coverage will need to be confirmed)  Never done   Zoster Vaccines- Shingrix (1 of 2) Never done   FOOT EXAM  04/14/2017   URINE MICROALBUMIN  11/24/2017    Colorectal cancer screening: Type of screening: Cologuard. Completed 02/09/2018. Repeat every 3 years  Lung Cancer Screening:  (Low Dose CT Chest recommended if Age 65-80ears, 30 pack-year currently smoking OR have quit w/in 15years.) does qualify.   Lung Cancer Screening Referral: no  Additional Screening:  Hepatitis C Screening: does qualify; Completed no  Vision Screening: Recommended annual ophthalmology exams for early detection of glaucoma and other disorders of the eye. Is the patient up to date with their annual eye exam?  No  Who is the provider or what is the name of the office in which the patient attends annual eye exams? Patient refused If pt is not established with a provider, would they like to be referred to a provider to establish care? No .   Dental Screening: Recommended annual dental exams for proper oral hygiene  Community Resource Referral / Chronic Care Management: CRR required this visit?  No   CCM required this visit?  No      Plan:     I have personally reviewed and noted the following in the patient's chart:   Medical and social history Use of alcohol, tobacco or illicit drugs  Current medications and supplements including opioid prescriptions. Patient is currently taking opioid prescriptions. Information provided to patient regarding non-opioid alternatives. Patient advised to discuss non-opioid treatment plan with their provider. Functional ability and status Nutritional status Physical activity Advanced directives List of other physicians Hospitalizations, surgeries, and ER visits in previous 12 months Vitals Screenings to include cognitive, depression, and falls Referrals and appointments  In addition, I have reviewed and discussed with patient certain preventive protocols, quality metrics, and best practice recommendations. A written personalized care plan for preventive services as well as general preventive health recommendations were provided to patient.     ShSheral FlowLPN   1194/03/[id number] Nurse Notes:  There were no vitals filed for this visit. There  is no height or weight on file to calculate BMI. Hearing Screening - Comments:: Patient denied  any hearing difficulty. No hearing aids needed. Vision Screening - Comments:: No issues with vision per patient.   Medical screening examination/treatment/procedure(s) were performed by non-physician practitioner and as supervising physician I was immediately available for consultation/collaboration.  I agree with above. Lew Dawes, MD

## 2021-07-21 ENCOUNTER — Encounter: Payer: Self-pay | Admitting: Internal Medicine

## 2021-07-21 ENCOUNTER — Other Ambulatory Visit: Payer: Self-pay

## 2021-07-21 ENCOUNTER — Ambulatory Visit (INDEPENDENT_AMBULATORY_CARE_PROVIDER_SITE_OTHER): Payer: Medicare HMO | Admitting: Internal Medicine

## 2021-07-21 DIAGNOSIS — E538 Deficiency of other specified B group vitamins: Secondary | ICD-10-CM | POA: Diagnosis not present

## 2021-07-21 DIAGNOSIS — F411 Generalized anxiety disorder: Secondary | ICD-10-CM

## 2021-07-21 DIAGNOSIS — G8929 Other chronic pain: Secondary | ICD-10-CM | POA: Diagnosis not present

## 2021-07-21 DIAGNOSIS — M5440 Lumbago with sciatica, unspecified side: Secondary | ICD-10-CM | POA: Diagnosis not present

## 2021-07-21 DIAGNOSIS — E1142 Type 2 diabetes mellitus with diabetic polyneuropathy: Secondary | ICD-10-CM

## 2021-07-21 DIAGNOSIS — E119 Type 2 diabetes mellitus without complications: Secondary | ICD-10-CM | POA: Diagnosis not present

## 2021-07-21 DIAGNOSIS — K029 Dental caries, unspecified: Secondary | ICD-10-CM | POA: Diagnosis not present

## 2021-07-21 MED ORDER — HYDROCODONE-ACETAMINOPHEN 5-325 MG PO TABS: 1.0000 | ORAL_TABLET | Freq: Two times a day (BID) | ORAL | 0 refills | Status: DC | PRN

## 2021-07-21 MED ORDER — TRIAMTERENE-HCTZ 37.5-25 MG PO TABS: 1.0000 | ORAL_TABLET | Freq: Every day | ORAL | 3 refills | Status: DC

## 2021-07-21 MED ORDER — PENICILLIN V POTASSIUM 500 MG PO TABS: ORAL_TABLET | ORAL | 0 refills | Status: DC

## 2021-07-21 NOTE — Assessment & Plan Note (Signed)
Norco prn  Potential benefits of a long term prn opioids use as well as potential risks (i.e. addiction risk, apnea etc) and complications (i.e. Somnolence, constipation and others) were explained to the patient and were aknowledged.  

## 2021-07-21 NOTE — Assessment & Plan Note (Signed)
On Vit B12 

## 2021-07-21 NOTE — Progress Notes (Signed)
Subjective:  Patient ID: Timothy Schmidt, male    DOB: 26-Dec-1955  Age: 65 y.o. MRN: [id number]  CC: Stress   HPI HYDEN SOLEY presents for caries, DM, HTN, LBP C/o stress  Outpatient Medications Prior to Visit  Medication Sig Dispense Refill   Alcohol Swabs (B-D SINGLE USE SWABS BUTTERFLY) PADS Use one two times daily.Dx: 250.00 100 each 3   Ascorbic Acid (VITAMIN C WITH ROSE HIPS) 1000 MG tablet Take 1,000 mg by mouth daily.     aspirin EC 81 MG tablet Take 81 mg by mouth daily.     Blood Glucose Calibration (PRODIGY CONTROL SOLUTION) LOW SOLN Use as directed per meter instructions. 3 each 0   Blood Glucose Monitoring Suppl (PRODIGY AUTOCODE BLOOD GLUCOSE) W/DEVICE KIT Use two times daily as directed. 1 each 0   cholecalciferol (VITAMIN D) 1000 UNITS tablet Take 1 tablet (1,000 Units total) by mouth daily. 90 tablet 3   cyanocobalamin 1000 MCG tablet Take 0.5 tablets (500 mcg total) by mouth daily. 90 tablet 3   glipiZIDE (GLUCOTROL) 5 MG tablet TAKE 1 TABLET BY MOUTH TWICE DAILY BEFORE MEAL(S) 180 tablet 3   glucose blood (PRODIGY AUTOCODE TEST) test strip Use one two times daily. Dx: 250.00 100 each 3   guaiFENesin-Codeine (DIABETIC TUSSIN C PO) Take by mouth as needed.     HYDROcodone-acetaminophen (NORCO/VICODIN) 5-325 MG tablet Take 1 tablet by mouth 2 (two) times daily as needed for severe pain. 60 tablet 0   metFORMIN (GLUCOPHAGE) 1000 MG tablet Take 1 tablet (1,000 mg total) by mouth 2 (two) times daily with a meal. 180 tablet 3   Misc Natural Products ([employer] PROSTATE MAX +) TABS Take 1 tablet by mouth 2 (two) times daily.     pioglitazone (ACTOS) 30 MG tablet Take 1 tablet by mouth once daily 90 tablet 1   potassium chloride SA (KLOR-CON) 20 MEQ tablet Take 1 tablet by mouth once daily 90 tablet 3   PRODIGY TWIST TOP LANCETS 28G MISC Use one two times daily. Dx: 250.00 100 each 3   triamterene-hydrochlorothiazide (MAXZIDE-25) 37.5-25 MG tablet Take 1 tablet by mouth daily.  90 tablet 3   finasteride (PROSCAR) 5 MG tablet Take 1 tablet by mouth once daily (Patient not taking: Reported on 07/21/2021) 30 tablet 0   tadalafil (CIALIS) 5 MG tablet Take 1 tablet (5 mg total) by mouth daily. (Patient not taking: Reported on 07/21/2021) 30 tablet 11   No facility-administered medications prior to visit.    ROS: Review of Systems  Constitutional:  Negative for appetite change, fatigue and unexpected weight change.  HENT:  Negative for congestion, nosebleeds, sneezing, sore throat and trouble swallowing.   Eyes:  Negative for itching and visual disturbance.  Respiratory:  Negative for cough.   Cardiovascular:  Negative for chest pain, palpitations and leg swelling.  Gastrointestinal:  Negative for abdominal distention, blood in stool, diarrhea and nausea.  Genitourinary:  Negative for frequency and hematuria.  Musculoskeletal:  Positive for back pain. Negative for joint swelling and neck pain.  Skin:  Negative for rash.  Neurological:  Negative for dizziness, tremors, speech difficulty and weakness.  Psychiatric/Behavioral:  Negative for agitation, dysphoric mood, sleep disturbance and suicidal ideas. The patient is not nervous/anxious.    Objective:  BP 124/80 (BP Location: Left Arm)   Pulse 93   Temp 98.8 F (37.1 C) (Oral)   Ht 5' 7.5" (1.715 m)   Wt 211 lb 9.6 oz (96 kg)  SpO2 98%   BMI 32.65 kg/m   BP Readings from Last 3 Encounters:  07/21/21 124/80  05/31/21 132/80  03/17/21 126/72    Wt Readings from Last 3 Encounters:  07/21/21 211 lb 9.6 oz (96 kg)  05/31/21 208 lb 12.8 oz (94.7 kg)  03/17/21 209 lb (94.8 kg)    Physical Exam Constitutional:      General: He is not in acute distress.    Appearance: He is well-developed. He is obese.     Comments: NAD  Eyes:     Conjunctiva/sclera: Conjunctivae normal.     Pupils: Pupils are equal, round, and reactive to light.  Neck:     Thyroid: No thyromegaly.     Vascular: No JVD.   Cardiovascular:     Rate and Rhythm: Normal rate and regular rhythm.     Heart sounds: Normal heart sounds. No murmur heard.   No friction rub. No gallop.  Pulmonary:     Effort: Pulmonary effort is normal. No respiratory distress.     Breath sounds: Normal breath sounds. No wheezing or rales.  Chest:     Chest wall: No tenderness.  Abdominal:     General: Bowel sounds are normal. There is no distension.     Palpations: Abdomen is soft. There is no mass.     Tenderness: There is no abdominal tenderness. There is no guarding or rebound.  Musculoskeletal:        General: Tenderness present. Normal range of motion.     Cervical back: Normal range of motion.  Lymphadenopathy:     Cervical: No cervical adenopathy.  Skin:    General: Skin is warm and dry.     Findings: No rash.  Neurological:     Mental Status: He is alert and oriented to person, place, and time.     Cranial Nerves: No cranial nerve deficit.     Motor: No abnormal muscle tone.     Coordination: Coordination normal.     Gait: Gait normal.     Deep Tendon Reflexes: Reflexes are normal and symmetric.  Psychiatric:        Behavior: Behavior normal.        Thought Content: Thought content normal.        Judgment: Judgment normal.   LS w/pain  Lab Results  Component Value Date   WBC 8.8 08/25/2019   HGB 13.3 08/25/2019   HCT 40.8 08/25/2019   PLT 237.0 08/25/2019   GLUCOSE 119 (H) 05/31/2021   CHOL 178 03/17/2021   TRIG 255.0 (H) 03/17/2021   HDL 42.90 03/17/2021   LDLDIRECT 59.0 03/17/2021   LDLCALC 59 01/02/2014   ALT 11 05/31/2021   AST 14 05/31/2021   NA 141 05/31/2021   K 3.9 05/31/2021   CL 104 05/31/2021   CREATININE 1.38 05/31/2021   BUN 17 05/31/2021   CO2 28 05/31/2021   TSH 1.31 01/01/2018   PSA 12.54 (H) 11/24/2020   HGBA1C 7.2 (H) 05/31/2021   MICROALBUR 0.7 11/24/2016    DG Chest 2 View  Result Date: 09/22/2010 *RADIOLOGY REPORT* Clinical Data: Shoulder spasm was obtained. CHEST - 2  VIEW Comparison: 08/05/2009 Findings: Normal heart size.  Linear scar at the left base.  Right lung is clear.  No pneumothorax or pleural effusion.  No acute bony deformity. IMPRESSION: No active cardiopulmonary disease. Original Report Authenticated By: Jamas Lav, M.D.  DG Shoulder Left  Result Date: 09/22/2010 *RADIOLOGY REPORT* Clinical Data: Shoulder spasm was and pain  LEFT SHOULDER - 2+ VIEW Comparison: None. Findings: No acute fracture.  No dislocation.  Unremarkable soft tissues. IMPRESSION: No acute bony pathology. Original Report Authenticated By: Jamas Lav, M.D.   Assessment & Plan:   Problem List Items Addressed This Visit     Anxiety state    Cont w/Wellbutrin po Potential benefits of a long term wellbutrin use as well as potential risks  and complications were explained to the patient and were aknowledged.      Relevant Orders   Comprehensive metabolic panel   D59 deficiency    On Vit B12      Caries    Extensive - worse. He hasn't come up with money yet. He missed his dental appt (free) in summer of 2013 He has to see a dentist - sch w/[employer] PCN po Rx given      LOW BACK PAIN    Norco prn  Potential benefits of a long term prn opioids use as well as potential risks (i.e. addiction risk, apnea etc) and complications (i.e. Somnolence, constipation and others) were explained to the patient and were aknowledged.      Type 2 diabetes mellitus (HCC)    Stable Cont w/Metformin, Glipizide, Actos      Relevant Orders   Hemoglobin A1c   Comprehensive metabolic panel      No orders of the defined types were placed in this encounter.     Follow-up: Return in about 3 months (around 10/19/2021) for a follow-up visit.  Walker Kehr, MD

## 2021-07-21 NOTE — Assessment & Plan Note (Signed)
Extensive - worse. He hasn't come up with money yet. He missed his dental appt (free) in summer of 2013 He has to see a dentist - sch w/[employer] PCN po Rx given

## 2021-07-21 NOTE — Assessment & Plan Note (Signed)
Cont w/Wellbutrin po Potential benefits of a long term wellbutrin use as well as potential risks  and complications were explained to the patient and were aknowledged.

## 2021-07-21 NOTE — Assessment & Plan Note (Addendum)
Stable Cont w/Metformin, Glipizide, Actos

## 2021-07-26 ENCOUNTER — Ambulatory Visit: Payer: Medicare HMO | Admitting: Internal Medicine

## 2021-08-17 ENCOUNTER — Telehealth: Payer: Self-pay | Admitting: Internal Medicine

## 2021-08-17 MED ORDER — HYDROCODONE-ACETAMINOPHEN 5-325 MG PO TABS: 1.0000 | ORAL_TABLET | Freq: Two times a day (BID) | ORAL | 0 refills | Status: DC | PRN

## 2021-08-17 NOTE — Telephone Encounter (Signed)
Patient requesting a refill for pain medication  Patient does not know the name of the medication  Patient was last seen 07-21-2021  Pharmacy  [id number] - [place], [place] - [address]

## 2021-09-01 ENCOUNTER — Encounter: Payer: Self-pay | Admitting: Internal Medicine

## 2021-09-01 ENCOUNTER — Ambulatory Visit (INDEPENDENT_AMBULATORY_CARE_PROVIDER_SITE_OTHER): Payer: Medicare HMO | Admitting: Internal Medicine

## 2021-09-01 ENCOUNTER — Other Ambulatory Visit: Payer: Self-pay

## 2021-09-01 DIAGNOSIS — Z6831 Body mass index (BMI) 31.0-31.9, adult: Secondary | ICD-10-CM | POA: Diagnosis not present

## 2021-09-01 DIAGNOSIS — M5440 Lumbago with sciatica, unspecified side: Secondary | ICD-10-CM | POA: Diagnosis not present

## 2021-09-01 DIAGNOSIS — E1142 Type 2 diabetes mellitus with diabetic polyneuropathy: Secondary | ICD-10-CM | POA: Diagnosis not present

## 2021-09-01 DIAGNOSIS — E6609 Other obesity due to excess calories: Secondary | ICD-10-CM | POA: Diagnosis not present

## 2021-09-01 DIAGNOSIS — E538 Deficiency of other specified B group vitamins: Secondary | ICD-10-CM

## 2021-09-01 DIAGNOSIS — G8929 Other chronic pain: Secondary | ICD-10-CM

## 2021-09-01 DIAGNOSIS — F411 Generalized anxiety disorder: Secondary | ICD-10-CM | POA: Diagnosis not present

## 2021-09-01 LAB — COMPREHENSIVE METABOLIC PANEL
ALT: 10 U/L (ref 0–53)
AST: 13 U/L (ref 0–37)
Albumin: 4.2 g/dL (ref 3.5–5.2)
Alkaline Phosphatase: 41 U/L (ref 39–117)
BUN: 13 mg/dL (ref 6–23)
CO2: 28 mEq/L (ref 19–32)
Calcium: 9.7 mg/dL (ref 8.4–10.5)
Chloride: 104 mEq/L (ref 96–112)
Creatinine, Ser: 1.36 mg/dL (ref 0.40–1.50)
GFR: 54.66 mL/min — ABNORMAL LOW (ref >60.00)
Glucose, Bld: 142 mg/dL — ABNORMAL HIGH (ref 70–99)
Potassium: 3.7 mEq/L (ref 3.5–5.1)
Sodium: 140 mEq/L (ref 135–145)
Total Bilirubin: 0.4 mg/dL (ref 0.2–1.2)
Total Protein: 7.1 g/dL (ref 6.0–8.3)

## 2021-09-01 LAB — HEMOGLOBIN A1C: Hgb A1c MFr Bld: 7.3 % — ABNORMAL HIGH (ref 4.6–6.5)

## 2021-09-01 NOTE — Assessment & Plan Note (Signed)
Chronic MSK/OA Norco prn  Potential benefits of a long term prn opioids use as well as potential risks (i.e. addiction risk, apnea etc) and complications (i.e. Somnolence, constipation and others) were explained to the patient and were aknowledged. 

## 2021-09-01 NOTE — Assessment & Plan Note (Signed)
Wt Readings from Last 3 Encounters:  09/01/21 207 lb 3.2 oz (94 kg)  07/21/21 211 lb 9.6 oz (96 kg)  05/31/21 208 lb 12.8 oz (94.7 kg)  Sione lost wt

## 2021-09-01 NOTE — Assessment & Plan Note (Signed)
Doing well 

## 2021-09-01 NOTE — Progress Notes (Signed)
Subjective:  Patient ID: Timothy Schmidt, male    DOB: 1956/05/13  Age: 66 y.o. MRN: [id number]  CC: Follow-up (3 month f/u)   HPI Timothy Schmidt presents for LBP, DM, HTN  Outpatient Medications Prior to Visit  Medication Sig Dispense Refill   Alcohol Swabs (B-D SINGLE USE SWABS BUTTERFLY) PADS Use one two times daily.Dx: 250.00 100 each 3   Ascorbic Acid (VITAMIN C WITH ROSE HIPS) 1000 MG tablet Take 1,000 mg by mouth daily.     aspirin EC 81 MG tablet Take 81 mg by mouth daily.     Blood Glucose Calibration (PRODIGY CONTROL SOLUTION) LOW SOLN Use as directed per meter instructions. 3 each 0   Blood Glucose Monitoring Suppl (PRODIGY AUTOCODE BLOOD GLUCOSE) W/DEVICE KIT Use two times daily as directed. 1 each 0   cholecalciferol (VITAMIN D) 1000 UNITS tablet Take 1 tablet (1,000 Units total) by mouth daily. 90 tablet 3   cyanocobalamin 1000 MCG tablet Take 0.5 tablets (500 mcg total) by mouth daily. 90 tablet 3   glipiZIDE (GLUCOTROL) 5 MG tablet TAKE 1 TABLET BY MOUTH TWICE DAILY BEFORE MEAL(S) 180 tablet 3   glucose blood (PRODIGY AUTOCODE TEST) test strip Use one two times daily. Dx: 250.00 100 each 3   guaiFENesin-Codeine (DIABETIC TUSSIN C PO) Take by mouth as needed.     HYDROcodone-acetaminophen (NORCO/VICODIN) 5-325 MG tablet Take 1 tablet by mouth 2 (two) times daily as needed for severe pain. 60 tablet 0   metFORMIN (GLUCOPHAGE) 1000 MG tablet Take 1 tablet (1,000 mg total) by mouth 2 (two) times daily with a meal. 180 tablet 3   Misc Natural Products (CVS PROSTATE MAX +) TABS Take 1 tablet by mouth 2 (two) times daily.     pioglitazone (ACTOS) 30 MG tablet Take 1 tablet by mouth once daily 90 tablet 1   potassium chloride SA (KLOR-CON) 20 MEQ tablet Take 1 tablet by mouth once daily 90 tablet 3   PRODIGY TWIST TOP LANCETS 28G MISC Use one two times daily. Dx: 250.00 100 each 3   triamterene-hydrochlorothiazide (MAXZIDE-25) 37.5-25 MG tablet Take 1 tablet by mouth daily. 90  tablet 3   penicillin v potassium (VEETID) 500 MG tablet TAKE ONE TABLET BY MOUTH FOUR TIMES DAILY (Patient not taking: Reported on 09/01/2021) 120 tablet 0   No facility-administered medications prior to visit.    ROS: Review of Systems  Constitutional:  Negative for appetite change, fatigue and unexpected weight change.  HENT:  Negative for congestion, nosebleeds, sneezing, sore throat and trouble swallowing.   Eyes:  Negative for itching and visual disturbance.  Respiratory:  Negative for cough.   Cardiovascular:  Negative for chest pain, palpitations and leg swelling.  Gastrointestinal:  Negative for abdominal distention, blood in stool, diarrhea and nausea.  Genitourinary:  Negative for frequency and hematuria.  Musculoskeletal:  Positive for back pain. Negative for gait problem, joint swelling and neck pain.  Skin:  Negative for rash.  Neurological:  Negative for dizziness, tremors, speech difficulty and weakness.  Psychiatric/Behavioral:  Negative for agitation, dysphoric mood, sleep disturbance and suicidal ideas. The patient is not nervous/anxious.    Objective:  BP 120/64 (BP Location: Left Arm)    Pulse (!) 106    Temp 99.1 F (37.3 C) (Oral)    Ht 5' 7.5" (1.715 m)    Wt 207 lb 3.2 oz (94 kg)    SpO2 97%    BMI 31.97 kg/m   BP Readings from Last  3 Encounters:  09/01/21 120/64  07/21/21 124/80  05/31/21 132/80    Wt Readings from Last 3 Encounters:  09/01/21 207 lb 3.2 oz (94 kg)  07/21/21 211 lb 9.6 oz (96 kg)  05/31/21 208 lb 12.8 oz (94.7 kg)    Physical Exam Constitutional:      General: He is not in acute distress.    Appearance: He is well-developed. He is obese.     Comments: NAD  Eyes:     Conjunctiva/sclera: Conjunctivae normal.     Pupils: Pupils are equal, round, and reactive to light.  Neck:     Thyroid: No thyromegaly.     Vascular: No JVD.  Cardiovascular:     Rate and Rhythm: Normal rate and regular rhythm.     Heart sounds: Normal heart  sounds. No murmur heard.   No friction rub. No gallop.  Pulmonary:     Effort: Pulmonary effort is normal. No respiratory distress.     Breath sounds: Normal breath sounds. No wheezing or rales.  Chest:     Chest wall: No tenderness.  Abdominal:     General: Bowel sounds are normal. There is no distension.     Palpations: Abdomen is soft. There is no mass.     Tenderness: There is no abdominal tenderness. There is no guarding or rebound.  Musculoskeletal:        General: Tenderness present. Normal range of motion.     Cervical back: Normal range of motion.  Lymphadenopathy:     Cervical: No cervical adenopathy.  Skin:    General: Skin is warm and dry.     Findings: No rash.  Neurological:     Mental Status: He is alert and oriented to person, place, and time.     Cranial Nerves: No cranial nerve deficit.     Motor: No abnormal muscle tone.     Coordination: Coordination normal.     Gait: Gait normal.     Deep Tendon Reflexes: Reflexes are normal and symmetric.  Psychiatric:        Behavior: Behavior normal.        Thought Content: Thought content normal.        Judgment: Judgment normal.  LS is tender Caries  Lab Results  Component Value Date   WBC 8.8 08/25/2019   HGB 13.3 08/25/2019   HCT 40.8 08/25/2019   PLT 237.0 08/25/2019   GLUCOSE 119 (H) 05/31/2021   CHOL 178 03/17/2021   TRIG 255.0 (H) 03/17/2021   HDL 42.90 03/17/2021   LDLDIRECT 59.0 03/17/2021   LDLCALC 59 01/02/2014   ALT 11 05/31/2021   AST 14 05/31/2021   NA 141 05/31/2021   K 3.9 05/31/2021   CL 104 05/31/2021   CREATININE 1.38 05/31/2021   BUN 17 05/31/2021   CO2 28 05/31/2021   TSH 1.31 01/01/2018   PSA 12.54 (H) 11/24/2020   HGBA1C 7.2 (H) 05/31/2021   MICROALBUR 0.7 11/24/2016    DG Chest 2 View  Result Date: 09/22/2010 *RADIOLOGY REPORT* Clinical Data: Shoulder spasm was obtained. CHEST - 2 VIEW Comparison: 08/05/2009 Findings: Normal heart size.  Linear scar at the left base.  Right  lung is clear.  No pneumothorax or pleural effusion.  No acute bony deformity. IMPRESSION: No active cardiopulmonary disease. Original Report Authenticated By: Jamas Lav, M.D.  DG Shoulder Left  Result Date: 09/22/2010 *RADIOLOGY REPORT* Clinical Data: Shoulder spasm was and pain LEFT SHOULDER - 2+ VIEW Comparison: None. Findings: No acute  fracture.  No dislocation.  Unremarkable soft tissues. IMPRESSION: No acute bony pathology. Original Report Authenticated By: Jamas Lav, M.D.   Assessment & Plan:   Problem List Items Addressed This Visit     Anxiety state    Doing well      B12 deficiency    On B12      LOW BACK PAIN    Chronic MSK/OA Norco prn  Potential benefits of a long term prn opioids use as well as potential risks (i.e. addiction risk, apnea etc) and complications (i.e. Somnolence, constipation and others) were explained to the patient and were aknowledged.      Obesity    Wt Readings from Last 3 Encounters:  09/01/21 207 lb 3.2 oz (94 kg)  07/21/21 211 lb 9.6 oz (96 kg)  05/31/21 208 lb 12.8 oz (94.7 kg)  Taegan lost wt       Type 2 diabetes mellitus (HCC)    Cont w/Metformin, Glipizide, Actos Check A1c today         No orders of the defined types were placed in this encounter.     Follow-up: Return in about 3 months (around 11/30/2021) for a follow-up visit.  Walker Kehr, MD

## 2021-09-01 NOTE — Assessment & Plan Note (Addendum)
Cont w/Metformin, Glipizide, Actos Check A1c today

## 2021-09-01 NOTE — Assessment & Plan Note (Signed)
On B12 

## 2021-09-11 ENCOUNTER — Other Ambulatory Visit: Payer: Self-pay | Admitting: Internal Medicine

## 2021-09-15 ENCOUNTER — Telehealth: Payer: Self-pay | Admitting: Internal Medicine

## 2021-09-15 NOTE — Telephone Encounter (Signed)
1.Medication Requested: HYDROcodone-acetaminophen (NORCO/VICODIN) 5-325 MG tablet  2. Pharmacy (Name, Street, [place]): [id number] - [place], [place] - [address]  Phone:  [phone] Fax:  [phone]   3. On Med List: yes  4. Last Visit with PCP: 01.12.23  5. Next visit date with PCP: 03.01.23   Agent: Please be advised that RX refills may take up to 3 business days. We ask that you follow-up with your pharmacy.

## 2021-09-16 MED ORDER — HYDROCODONE-ACETAMINOPHEN 5-325 MG PO TABS: 1.0000 | ORAL_TABLET | Freq: Two times a day (BID) | ORAL | 0 refills | Status: DC | PRN

## 2021-09-16 NOTE — Telephone Encounter (Signed)
Okay.  Thanks.

## 2021-09-20 ENCOUNTER — Telehealth: Payer: Self-pay | Admitting: Internal Medicine

## 2021-09-20 NOTE — Telephone Encounter (Signed)
Pt requesting to pick up "a letter the doctor wrote for him pertaining to a court case."  Pt requesting a call back to discuss request

## 2021-09-21 NOTE — Telephone Encounter (Signed)
Called pt there was no answer LMOM RTC,.../lb

## 2021-09-22 NOTE — Telephone Encounter (Signed)
Printed letter from Oct 24, place up front for pick-up.Marland KitchenJohny Schmidt

## 2021-09-22 NOTE — Telephone Encounter (Signed)
Pt states that he will come by tomorrow to pick up letter. Pt was unaware of the message that Lucy left on 09/21/21. Pt was going to listen to the message after getting off the phone with me.  Please advise pt if its not ready [phone]

## 2021-10-17 ENCOUNTER — Telehealth: Payer: Self-pay | Admitting: Internal Medicine

## 2021-10-17 NOTE — Telephone Encounter (Signed)
1.Medication Requested: HYDROcodone-acetaminophen (NORCO/VICODIN) 5-325 MG tablet  2. Pharmacy (Name, Street, [place]): [id number] - [place], [place] - [address]  3. On Med List: yes  4. Last Visit with PCP: 09-01-2021  5. Next visit date with PCP: 10-19-2021   Agent: Please be advised that RX refills may take up to 3 business days. We ask that you follow-up with your pharmacy.

## 2021-10-18 MED ORDER — HYDROCODONE-ACETAMINOPHEN 5-325 MG PO TABS: 1.0000 | ORAL_TABLET | Freq: Two times a day (BID) | ORAL | 0 refills | Status: DC | PRN

## 2021-10-18 NOTE — Telephone Encounter (Signed)
Okay.  Thanks.

## 2021-10-19 ENCOUNTER — Other Ambulatory Visit: Payer: Self-pay

## 2021-10-19 ENCOUNTER — Ambulatory Visit (INDEPENDENT_AMBULATORY_CARE_PROVIDER_SITE_OTHER): Payer: Medicare HMO | Admitting: Internal Medicine

## 2021-10-19 ENCOUNTER — Encounter: Payer: Self-pay | Admitting: Internal Medicine

## 2021-10-19 DIAGNOSIS — K029 Dental caries, unspecified: Secondary | ICD-10-CM

## 2021-10-19 DIAGNOSIS — E6609 Other obesity due to excess calories: Secondary | ICD-10-CM | POA: Diagnosis not present

## 2021-10-19 DIAGNOSIS — G8929 Other chronic pain: Secondary | ICD-10-CM | POA: Diagnosis not present

## 2021-10-19 DIAGNOSIS — R972 Elevated prostate specific antigen [PSA]: Secondary | ICD-10-CM

## 2021-10-19 DIAGNOSIS — M5440 Lumbago with sciatica, unspecified side: Secondary | ICD-10-CM

## 2021-10-19 DIAGNOSIS — E538 Deficiency of other specified B group vitamins: Secondary | ICD-10-CM

## 2021-10-19 DIAGNOSIS — Z6831 Body mass index (BMI) 31.0-31.9, adult: Secondary | ICD-10-CM

## 2021-10-19 DIAGNOSIS — E1142 Type 2 diabetes mellitus with diabetic polyneuropathy: Secondary | ICD-10-CM | POA: Diagnosis not present

## 2021-10-19 NOTE — Assessment & Plan Note (Signed)
Monitoring PSA 

## 2021-10-19 NOTE — Assessment & Plan Note (Signed)
Cont w/Metformin, Glipizide, Actos Monitor A1c 

## 2021-10-19 NOTE — Assessment & Plan Note (Signed)
Lacking funds to fix dental problem.  ?

## 2021-10-19 NOTE — Assessment & Plan Note (Signed)
Wt Readings from Last 3 Encounters:  ?10/19/21 208 lb (94.3 kg)  ?09/01/21 207 lb 3.2 oz (94 kg)  ?07/21/21 211 lb 9.6 oz (96 kg)  ?Dolton is trying to do better w/his diet ? ?

## 2021-10-19 NOTE — Assessment & Plan Note (Signed)
Norco prn  Potential benefits of a long term prn opioids use as well as potential risks (i.e. addiction risk, apnea etc) and complications (i.e. Somnolence, constipation and others) were explained to the patient and were aknowledged.  

## 2021-10-19 NOTE — Assessment & Plan Note (Signed)
Cont on Vitamin B12 

## 2021-10-19 NOTE — Progress Notes (Signed)
? ?Subjective:  ?Patient ID: Timothy Schmidt, male    DOB: 1955/09/05  Age: 66 y.o. MRN: [id number] ? ?CC: Follow-up ? ? ?HPI ?Timothy Schmidt presents for DM, OA/LBP, HTN ? ?Outpatient Medications Prior to Visit  ?Medication Sig Dispense Refill  ? Alcohol Swabs (B-D SINGLE USE SWABS BUTTERFLY) PADS Use one two times daily.Dx: 250.00 100 each 3  ? Ascorbic Acid (VITAMIN C WITH ROSE HIPS) 1000 MG tablet Take 1,000 mg by mouth daily.    ? aspirin EC 81 MG tablet Take 81 mg by mouth daily.    ? Blood Glucose Calibration (PRODIGY CONTROL SOLUTION) LOW SOLN Use as directed per meter instructions. 3 each 0  ? Blood Glucose Monitoring Suppl (PRODIGY AUTOCODE BLOOD GLUCOSE) W/DEVICE KIT Use two times daily as directed. 1 each 0  ? cholecalciferol (VITAMIN D) 1000 UNITS tablet Take 1 tablet (1,000 Units total) by mouth daily. 90 tablet 3  ? cyanocobalamin 1000 MCG tablet Take 0.5 tablets (500 mcg total) by mouth daily. 90 tablet 3  ? glipiZIDE (GLUCOTROL) 5 MG tablet TAKE 1 TABLET BY MOUTH TWICE DAILY BEFORE MEAL(S) 180 tablet 3  ? glucose blood (PRODIGY AUTOCODE TEST) test strip Use one two times daily. Dx: 250.00 100 each 3  ? guaiFENesin-Codeine (DIABETIC TUSSIN C PO) Take by mouth as needed.    ? HYDROcodone-acetaminophen (NORCO/VICODIN) 5-325 MG tablet Take 1 tablet by mouth 2 (two) times daily as needed for severe pain. 60 tablet 0  ? metFORMIN (GLUCOPHAGE) 1000 MG tablet Take 1 tablet (1,000 mg total) by mouth 2 (two) times daily with a meal. 180 tablet 3  ? Misc Natural Products (CVS PROSTATE MAX +) TABS Take 1 tablet by mouth 2 (two) times daily.    ? pioglitazone (ACTOS) 30 MG tablet Take 1 tablet by mouth once daily 90 tablet 0  ? potassium chloride SA (KLOR-CON) 20 MEQ tablet Take 1 tablet by mouth once daily 90 tablet 3  ? PRODIGY TWIST TOP LANCETS 28G MISC Use one two times daily. Dx: 250.00 100 each 3  ? triamterene-hydrochlorothiazide (MAXZIDE-25) 37.5-25 MG tablet Take 1 tablet by mouth daily. 90 tablet 3   ? ?No facility-administered medications prior to visit.  ? ? ?ROS: ?Review of Systems  ?Constitutional:  Negative for appetite change, fatigue and unexpected weight change.  ?HENT:  Negative for congestion, nosebleeds, sneezing, sore throat and trouble swallowing.   ?Eyes:  Negative for itching and visual disturbance.  ?Respiratory:  Negative for cough.   ?Cardiovascular:  Negative for chest pain, palpitations and leg swelling.  ?Gastrointestinal:  Negative for abdominal distention, blood in stool, diarrhea and nausea.  ?Genitourinary:  Negative for frequency and hematuria.  ?Musculoskeletal:  Positive for arthralgias and back pain. Negative for gait problem, joint swelling and neck pain.  ?Skin:  Negative for rash.  ?Neurological:  Negative for dizziness, tremors, speech difficulty and weakness.  ?Psychiatric/Behavioral:  Negative for agitation, dysphoric mood, sleep disturbance and suicidal ideas. The patient is not nervous/anxious.   ? ?Objective:  ?BP 126/62 (BP Location: Left Arm, Patient Position: Sitting, Cuff Size: Large)   Pulse 96   Temp 98.5 ?F (36.9 ?C) (Oral)   Ht 5' 7.5" (1.715 m)   Wt 208 lb (94.3 kg)   SpO2 98%   BMI 32.10 kg/m?  ? ?BP Readings from Last 3 Encounters:  ?10/19/21 126/62  ?09/01/21 120/64  ?07/21/21 124/80  ? ? ?Wt Readings from Last 3 Encounters:  ?10/19/21 208 lb (94.3 kg)  ?09/01/21 207 lb  3.2 oz (94 kg)  ?07/21/21 211 lb 9.6 oz (96 kg)  ? ? ?Physical Exam ?Constitutional:   ?   General: He is not in acute distress. ?   Appearance: He is well-developed. He is obese.  ?   Comments: NAD  ?Eyes:  ?   Conjunctiva/sclera: Conjunctivae normal.  ?   Pupils: Pupils are equal, round, and reactive to light.  ?Neck:  ?   Thyroid: No thyromegaly.  ?   Vascular: No JVD.  ?Cardiovascular:  ?   Rate and Rhythm: Normal rate and regular rhythm.  ?   Heart sounds: Normal heart sounds. No murmur heard. ?  No friction rub. No gallop.  ?Pulmonary:  ?   Effort: Pulmonary effort is normal. No  respiratory distress.  ?   Breath sounds: Normal breath sounds. No wheezing or rales.  ?Chest:  ?   Chest wall: No tenderness.  ?Abdominal:  ?   General: Bowel sounds are normal. There is no distension.  ?   Palpations: Abdomen is soft. There is no mass.  ?   Tenderness: There is no abdominal tenderness. There is no guarding or rebound.  ?Musculoskeletal:     ?   General: Tenderness present. Normal range of motion.  ?   Cervical back: Normal range of motion.  ?Lymphadenopathy:  ?   Cervical: No cervical adenopathy.  ?Skin: ?   General: Skin is warm and dry.  ?   Findings: No rash.  ?Neurological:  ?   Mental Status: He is alert and oriented to person, place, and time.  ?   Cranial Nerves: No cranial nerve deficit.  ?   Motor: No abnormal muscle tone.  ?   Coordination: Coordination normal.  ?   Gait: Gait normal.  ?   Deep Tendon Reflexes: Reflexes are normal and symmetric.  ?Psychiatric:     ?   Behavior: Behavior normal.     ?   Thought Content: Thought content normal.     ?   Judgment: Judgment normal.  ?LS is stiff ? ?Lab Results  ?Component Value Date  ? WBC 8.8 08/25/2019  ? HGB 13.3 08/25/2019  ? HCT 40.8 08/25/2019  ? PLT 237.0 08/25/2019  ? GLUCOSE 142 (H) 09/01/2021  ? CHOL 178 03/17/2021  ? TRIG 255.0 (H) 03/17/2021  ? HDL 42.90 03/17/2021  ? LDLDIRECT 59.0 03/17/2021  ? [place] 59 01/02/2014  ? ALT 10 09/01/2021  ? AST 13 09/01/2021  ? NA 140 09/01/2021  ? K 3.7 09/01/2021  ? CL 104 09/01/2021  ? CREATININE 1.36 09/01/2021  ? BUN 13 09/01/2021  ? CO2 28 09/01/2021  ? TSH 1.31 01/01/2018  ? PSA 12.54 (H) 11/24/2020  ? HGBA1C 7.3 (H) 09/01/2021  ? MICROALBUR 0.7 11/24/2016  ? ? ?DG Chest 2 View ? ?Result Date: 09/22/2010 ?*RADIOLOGY REPORT* Clinical Data: Shoulder spasm was obtained. CHEST - 2 VIEW Comparison: 08/05/2009 Findings: Normal heart size.  Linear scar at the left base.  Right lung is clear.  No pneumothorax or pleural effusion.  No acute bony deformity. IMPRESSION: No active cardiopulmonary  disease. Original Report Authenticated By: Jamas Lav, M.D. ? ?DG Shoulder Left ? ?Result Date: 09/22/2010 ?*RADIOLOGY REPORT* Clinical Data: Shoulder spasm was and pain LEFT SHOULDER - 2+ VIEW Comparison: None. Findings: No acute fracture.  No dislocation.  Unremarkable soft tissues. IMPRESSION: No acute bony pathology. Original Report Authenticated By: Jamas Lav, M.D. ? ? ?Assessment & Plan:  ? ?Problem List Items Addressed This  Visit   ? ? B12 deficiency  ?  Cont on Vitamin B12 ?  ?  ? Caries  ?  Lacking funds to fix dental problem.  ?  ?  ? Elevated PSA  ?  Monitoring PSA ?  ?  ? Relevant Orders  ? PSA  ? LOW BACK PAIN  ?  Norco prn ? Potential benefits of a long term prn opioids use as well as potential risks (i.e. addiction risk, apnea etc) and complications (i.e. Somnolence, constipation and others) were explained to the patient and were aknowledged. ?  ?  ? Obesity  ?  Wt Readings from Last 3 Encounters:  ?10/19/21 208 lb (94.3 kg)  ?09/01/21 207 lb 3.2 oz (94 kg)  ?07/21/21 211 lb 9.6 oz (96 kg)  ?Raeshawn is trying to do better w/his diet ? ?  ?  ? Type 2 diabetes mellitus ([place])  ?  Cont w/Metformin, Glipizide, Actos ?Monitor A1c ?  ?  ?  ? ? ?No orders of the defined types were placed in this encounter. ?  ? ? ?Follow-up: No follow-ups on file. ? ?Walker Kehr, MD ?

## 2021-10-21 ENCOUNTER — Encounter: Payer: Self-pay | Admitting: Internal Medicine

## 2021-10-31 ENCOUNTER — Ambulatory Visit (INDEPENDENT_AMBULATORY_CARE_PROVIDER_SITE_OTHER): Payer: Medicare HMO | Admitting: Internal Medicine

## 2021-10-31 ENCOUNTER — Encounter: Payer: Self-pay | Admitting: Internal Medicine

## 2021-10-31 ENCOUNTER — Other Ambulatory Visit: Payer: Self-pay

## 2021-10-31 DIAGNOSIS — R972 Elevated prostate specific antigen [PSA]: Secondary | ICD-10-CM

## 2021-10-31 DIAGNOSIS — K029 Dental caries, unspecified: Secondary | ICD-10-CM

## 2021-10-31 LAB — PSA: PSA: 12.24 ng/mL — ABNORMAL HIGH (ref 0.10–4.00)

## 2021-10-31 MED ORDER — PENICILLIN V POTASSIUM 500 MG PO TABS: ORAL_TABLET | ORAL | 0 refills | Status: DC

## 2021-10-31 NOTE — Progress Notes (Signed)
? ?Subjective:  ?Patient ID: Timothy Schmidt, male    DOB: Oct 13, 1955  Age: 66 y.o. MRN: [id number] ? ?CC: No chief complaint on file. ? ? ?HPI ?Timothy Schmidt presents for caries - worse ? ?Outpatient Medications Prior to Visit  ?Medication Sig Dispense Refill  ? Alcohol Swabs (B-D SINGLE USE SWABS BUTTERFLY) PADS Use one two times daily.Dx: 250.00 100 each 3  ? Ascorbic Acid (VITAMIN C WITH ROSE HIPS) 1000 MG tablet Take 1,000 mg by mouth daily.    ? aspirin EC 81 MG tablet Take 81 mg by mouth daily.    ? Blood Glucose Calibration (PRODIGY CONTROL SOLUTION) LOW SOLN Use as directed per meter instructions. 3 each 0  ? Blood Glucose Monitoring Suppl (PRODIGY AUTOCODE BLOOD GLUCOSE) W/DEVICE KIT Use two times daily as directed. 1 each 0  ? cholecalciferol (VITAMIN D) 1000 UNITS tablet Take 1 tablet (1,000 Units total) by mouth daily. 90 tablet 3  ? cyanocobalamin 1000 MCG tablet Take 0.5 tablets (500 mcg total) by mouth daily. 90 tablet 3  ? glipiZIDE (GLUCOTROL) 5 MG tablet TAKE 1 TABLET BY MOUTH TWICE DAILY BEFORE MEAL(S) 180 tablet 3  ? glucose blood (PRODIGY AUTOCODE TEST) test strip Use one two times daily. Dx: 250.00 100 each 3  ? guaiFENesin-Codeine (DIABETIC TUSSIN C PO) Take by mouth as needed.    ? HYDROcodone-acetaminophen (NORCO/VICODIN) 5-325 MG tablet Take 1 tablet by mouth 2 (two) times daily as needed for severe pain. 60 tablet 0  ? metFORMIN (GLUCOPHAGE) 1000 MG tablet Take 1 tablet (1,000 mg total) by mouth 2 (two) times daily with a meal. 180 tablet 3  ? Misc Natural Products (CVS PROSTATE MAX +) TABS Take 1 tablet by mouth 2 (two) times daily.    ? pioglitazone (ACTOS) 30 MG tablet Take 1 tablet by mouth once daily 90 tablet 0  ? potassium chloride SA (KLOR-CON) 20 MEQ tablet Take 1 tablet by mouth once daily 90 tablet 3  ? PRODIGY TWIST TOP LANCETS 28G MISC Use one two times daily. Dx: 250.00 100 each 3  ? triamterene-hydrochlorothiazide (MAXZIDE-25) 37.5-25 MG tablet Take 1 tablet by mouth  daily. 90 tablet 3  ? ?No facility-administered medications prior to visit.  ? ? ?ROS: ?Review of Systems  ?HENT:  Positive for dental problem.   ? ?Objective:  ?BP (!) 158/98 (BP Location: Left Arm, Patient Position: Sitting, Cuff Size: Large)   Pulse (!) 101   Temp 98.6 ?F (37 ?C) (Oral)   Ht 5' 7.5" (1.715 m)   Wt 207 lb (93.9 kg)   SpO2 95%   BMI 31.94 kg/m?  ? ?BP Readings from Last 3 Encounters:  ?10/31/21 (!) 158/98  ?10/19/21 126/62  ?09/01/21 120/64  ? ? ?Wt Readings from Last 3 Encounters:  ?10/31/21 207 lb (93.9 kg)  ?10/19/21 208 lb (94.3 kg)  ?09/01/21 207 lb 3.2 oz (94 kg)  ? ? ?Physical Exam ?Constitutional:   ?   General: He is not in acute distress. ?   Appearance: He is well-developed. He is obese. He is not ill-appearing.  ?   Comments: NAD  ?Eyes:  ?   Conjunctiva/sclera: Conjunctivae normal.  ?   Pupils: Pupils are equal, round, and reactive to light.  ?Neck:  ?   Thyroid: No thyromegaly.  ?   Vascular: No JVD.  ?Cardiovascular:  ?   Rate and Rhythm: Normal rate and regular rhythm.  ?   Heart sounds: Normal heart sounds. No murmur heard. ?  No friction rub. No gallop.  ?Pulmonary:  ?   Effort: Pulmonary effort is normal. No respiratory distress.  ?   Breath sounds: Normal breath sounds. No wheezing or rales.  ?Chest:  ?   Chest wall: No tenderness.  ?Abdominal:  ?   General: Bowel sounds are normal. There is no distension.  ?   Palpations: Abdomen is soft. There is no mass.  ?   Tenderness: There is no abdominal tenderness. There is no guarding or rebound.  ?Musculoskeletal:     ?   General: No tenderness. Normal range of motion.  ?   Cervical back: Normal range of motion.  ?Lymphadenopathy:  ?   Cervical: No cervical adenopathy.  ?Skin: ?   General: Skin is warm and dry.  ?   Findings: No rash.  ?Neurological:  ?   Mental Status: He is alert and oriented to person, place, and time.  ?   Cranial Nerves: No cranial nerve deficit.  ?   Motor: No abnormal muscle tone.  ?   Coordination:  Coordination normal.  ?   Gait: Gait normal.  ?   Deep Tendon Reflexes: Reflexes are normal and symmetric.  ?Psychiatric:     ?   Behavior: Behavior normal.     ?   Thought Content: Thought content normal.     ?   Judgment: Judgment normal.  ?Caries teeth ? ?Lab Results  ?Component Value Date  ? WBC 8.8 08/25/2019  ? HGB 13.3 08/25/2019  ? HCT 40.8 08/25/2019  ? PLT 237.0 08/25/2019  ? GLUCOSE 142 (H) 09/01/2021  ? CHOL 178 03/17/2021  ? TRIG 255.0 (H) 03/17/2021  ? HDL 42.90 03/17/2021  ? LDLDIRECT 59.0 03/17/2021  ? [place] 59 01/02/2014  ? ALT 10 09/01/2021  ? AST 13 09/01/2021  ? NA 140 09/01/2021  ? K 3.7 09/01/2021  ? CL 104 09/01/2021  ? CREATININE 1.36 09/01/2021  ? BUN 13 09/01/2021  ? CO2 28 09/01/2021  ? TSH 1.31 01/01/2018  ? PSA 12.54 (H) 11/24/2020  ? HGBA1C 7.3 (H) 09/01/2021  ? MICROALBUR 0.7 11/24/2016  ? ? ?DG Chest 2 View ? ?Result Date: 09/22/2010 ?*RADIOLOGY REPORT* Clinical Data: Shoulder spasm was obtained. CHEST - 2 VIEW Comparison: 08/05/2009 Findings: Normal heart size.  Linear scar at the left base.  Right lung is clear.  No pneumothorax or pleural effusion.  No acute bony deformity. IMPRESSION: No active cardiopulmonary disease. Original Report Authenticated By: Jamas Lav, M.D. ? ?DG Shoulder Left ? ?Result Date: 09/22/2010 ?*RADIOLOGY REPORT* Clinical Data: Shoulder spasm was and pain LEFT SHOULDER - 2+ VIEW Comparison: None. Findings: No acute fracture.  No dislocation.  Unremarkable soft tissues. IMPRESSION: No acute bony pathology. Original Report Authenticated By: Jamas Lav, M.D. ? ? ?Assessment & Plan:  ? ?Problem List Items Addressed This Visit   ? ? Caries  ?  Extensive. He hasn't come up with money yet in 2023. He missed his dental appts (free). ?He needs to see a dentist - Dental School is suggested again ?2023 Lacking funds to fix dental problem.  ?PCN Rx given ?  ?  ?  ? ? ?Meds ordered this encounter  ?Medications  ? penicillin v potassium (VEETID) 500 MG tablet  ?   Sig: TAKE ONE TABLET BY MOUTH FOUR TIMES DAILY  ?  Dispense:  120 tablet  ?  Refill:  0  ?  ? ? ?Follow-up: No follow-ups on file. ? ?Walker Kehr, MD ?

## 2021-10-31 NOTE — Assessment & Plan Note (Signed)
Extensive. He hasn't come up with money yet in 2023. He missed his dental appts (free). ?He needs to see a dentist - Dental School is suggested again ?2023 Lacking funds to fix dental problem.  ?PCN Rx given ?

## 2021-11-02 ENCOUNTER — Telehealth: Payer: Self-pay

## 2021-11-02 NOTE — Telephone Encounter (Signed)
Pt is calling requesting a call back about a referral to urology for his prostate. Pt states someone called him yesterday to set up appt but he needed a referral.  ? ?Please advise [phone] ?

## 2021-11-02 NOTE — Telephone Encounter (Signed)
Pt states [facility] [place] is requesting his PSA result ? ?Pt provided phone # [phone]- [facility] ? ?Pt requesting cb ? ?

## 2021-11-04 NOTE — Telephone Encounter (Signed)
Pt checking status of referral ? ?Pt requesting a cb w/ status update ?

## 2021-11-07 NOTE — Telephone Encounter (Signed)
PSA results have been faxed [facility] [place]. ? ?Referral has been sent in once insurance approves pt will get a call from referral deptmnt with date and time of his apptmnt. ?

## 2021-11-15 ENCOUNTER — Telehealth: Payer: Self-pay

## 2021-11-15 NOTE — Telephone Encounter (Signed)
Pt is requesting a refill on: ?HYDROcodone-acetaminophen (NORCO/VICODIN) 5-325 MG tablet. ? ?Pharmacy: ?[id number] - [place], [place] - [address] ? ?LOV 10/31/21 ?ROV 12/01/21 ? ?

## 2021-11-16 MED ORDER — HYDROCODONE-ACETAMINOPHEN 5-325 MG PO TABS: 1.0000 | ORAL_TABLET | Freq: Two times a day (BID) | ORAL | 0 refills | Status: DC | PRN

## 2021-11-16 NOTE — Telephone Encounter (Signed)
Okay.  Done.  Thanks 

## 2021-11-30 ENCOUNTER — Other Ambulatory Visit: Payer: Self-pay | Admitting: Internal Medicine

## 2021-12-01 ENCOUNTER — Encounter: Payer: Self-pay | Admitting: Internal Medicine

## 2021-12-01 ENCOUNTER — Ambulatory Visit (INDEPENDENT_AMBULATORY_CARE_PROVIDER_SITE_OTHER): Payer: Medicare HMO | Admitting: Internal Medicine

## 2021-12-01 VITALS — BP 130/86 | HR 89 | Temp 98.8°F | Ht 67.5 in | Wt 211.0 lb

## 2021-12-01 DIAGNOSIS — G8929 Other chronic pain: Secondary | ICD-10-CM | POA: Diagnosis not present

## 2021-12-01 DIAGNOSIS — I10 Essential (primary) hypertension: Secondary | ICD-10-CM | POA: Diagnosis not present

## 2021-12-01 DIAGNOSIS — E6609 Other obesity due to excess calories: Secondary | ICD-10-CM

## 2021-12-01 DIAGNOSIS — K029 Dental caries, unspecified: Secondary | ICD-10-CM | POA: Diagnosis not present

## 2021-12-01 DIAGNOSIS — R972 Elevated prostate specific antigen [PSA]: Secondary | ICD-10-CM

## 2021-12-01 DIAGNOSIS — Z6831 Body mass index (BMI) 31.0-31.9, adult: Secondary | ICD-10-CM | POA: Diagnosis not present

## 2021-12-01 DIAGNOSIS — M5440 Lumbago with sciatica, unspecified side: Secondary | ICD-10-CM | POA: Diagnosis not present

## 2021-12-01 NOTE — Assessment & Plan Note (Signed)
Worse ?Will ref to Dr Jeralyn Bennett ?

## 2021-12-01 NOTE — Assessment & Plan Note (Signed)
Norco prn  Potential benefits of a long term prn opioids use as well as potential risks (i.e. addiction risk, apnea etc) and complications (i.e. Somnolence, constipation and others) were explained to the patient and were aknowledged.  

## 2021-12-01 NOTE — Assessment & Plan Note (Signed)
Chronic Cont on Triamt/HCT 

## 2021-12-01 NOTE — Progress Notes (Signed)
? ?Subjective:  ?Patient ID: Timothy Schmidt, male    DOB: 24-Apr-1956  Age: 66 y.o. MRN: [id number] ? ?CC: No chief complaint on file. ? ? ?HPI ?Timothy Schmidt presents for caries, elevated PSA, HTN, DM ? ?Outpatient Medications Prior to Visit  ?Medication Sig Dispense Refill  ? Alcohol Swabs (B-D SINGLE USE SWABS BUTTERFLY) PADS Use one two times daily.Dx: 250.00 100 each 3  ? Ascorbic Acid (VITAMIN C WITH ROSE HIPS) 1000 MG tablet Take 1,000 mg by mouth daily.    ? aspirin EC 81 MG tablet Take 81 mg by mouth daily.    ? Blood Glucose Calibration (PRODIGY CONTROL SOLUTION) LOW SOLN Use as directed per meter instructions. 3 each 0  ? Blood Glucose Monitoring Suppl (PRODIGY AUTOCODE BLOOD GLUCOSE) W/DEVICE KIT Use two times daily as directed. 1 each 0  ? cholecalciferol (VITAMIN D) 1000 UNITS tablet Take 1 tablet (1,000 Units total) by mouth daily. 90 tablet 3  ? cyanocobalamin 1000 MCG tablet Take 0.5 tablets (500 mcg total) by mouth daily. 90 tablet 3  ? glipiZIDE (GLUCOTROL) 5 MG tablet TAKE 1 TABLET BY MOUTH TWICE DAILY BEFORE MEAL(S) 180 tablet 3  ? glucose blood (PRODIGY AUTOCODE TEST) test strip Use one two times daily. Dx: 250.00 100 each 3  ? guaiFENesin-Codeine (DIABETIC TUSSIN C PO) Take by mouth as needed.    ? HYDROcodone-acetaminophen (NORCO/VICODIN) 5-325 MG tablet Take 1 tablet by mouth 2 (two) times daily as needed for severe pain. 60 tablet 0  ? metFORMIN (GLUCOPHAGE) 1000 MG tablet Take 1 tablet (1,000 mg total) by mouth 2 (two) times daily with a meal. 180 tablet 3  ? Misc Natural Products (CVS PROSTATE MAX +) TABS Take 1 tablet by mouth 2 (two) times daily.    ? penicillin v potassium (VEETID) 500 MG tablet TAKE ONE TABLET BY MOUTH FOUR TIMES DAILY 120 tablet 0  ? pioglitazone (ACTOS) 30 MG tablet Take 1 tablet by mouth once daily 90 tablet 3  ? potassium chloride SA (KLOR-CON M) 20 MEQ tablet Take 1 tablet by mouth once daily 90 tablet 3  ? PRODIGY TWIST TOP LANCETS 28G MISC Use one two times  daily. Dx: 250.00 100 each 3  ? triamterene-hydrochlorothiazide (MAXZIDE-25) 37.5-25 MG tablet Take 1 tablet by mouth daily. 90 tablet 3  ? ?No facility-administered medications prior to visit.  ? ? ?ROS: ?Review of Systems  ?Constitutional:  Negative for appetite change, fatigue and unexpected weight change.  ?HENT:  Positive for dental problem. Negative for congestion, nosebleeds, sneezing, sore throat and trouble swallowing.   ?Eyes:  Negative for itching and visual disturbance.  ?Respiratory:  Negative for cough.   ?Cardiovascular:  Negative for chest pain, palpitations and leg swelling.  ?Gastrointestinal:  Negative for abdominal distention, blood in stool, diarrhea and nausea.  ?Genitourinary:  Negative for frequency and hematuria.  ?Musculoskeletal:  Positive for back pain. Negative for gait problem, joint swelling and neck pain.  ?Skin:  Negative for rash.  ?Neurological:  Negative for dizziness, tremors, speech difficulty and weakness.  ?Psychiatric/Behavioral:  Negative for agitation, dysphoric mood and sleep disturbance. The patient is not nervous/anxious.   ? ?Objective:  ?BP 130/86 (BP Location: Left Arm, Patient Position: Sitting, Cuff Size: Large)   Pulse 89   Temp 98.8 ?F (37.1 ?C) (Oral)   Ht 5' 7.5" (1.715 m)   Wt 211 lb (95.7 kg)   SpO2 98%   BMI 32.56 kg/m?  ? ?BP Readings from Last 3 Encounters:  ?  12/01/21 130/86  ?10/31/21 (!) 158/98  ?10/19/21 126/62  ? ? ?Wt Readings from Last 3 Encounters:  ?12/01/21 211 lb (95.7 kg)  ?10/31/21 207 lb (93.9 kg)  ?10/19/21 208 lb (94.3 kg)  ? ? ?Physical Exam ?Constitutional:   ?   General: He is not in acute distress. ?   Appearance: He is well-developed. He is obese.  ?   Comments: NAD  ?Eyes:  ?   Conjunctiva/sclera: Conjunctivae normal.  ?   Pupils: Pupils are equal, round, and reactive to light.  ?Neck:  ?   Thyroid: No thyromegaly.  ?   Vascular: No JVD.  ?Cardiovascular:  ?   Rate and Rhythm: Normal rate and regular rhythm.  ?   Heart sounds:  Normal heart sounds. No murmur heard. ?  No friction rub. No gallop.  ?Pulmonary:  ?   Effort: Pulmonary effort is normal. No respiratory distress.  ?   Breath sounds: Normal breath sounds. No wheezing or rales.  ?Chest:  ?   Chest wall: No tenderness.  ?Abdominal:  ?   General: Bowel sounds are normal. There is no distension.  ?   Palpations: Abdomen is soft. There is no mass.  ?   Tenderness: There is no abdominal tenderness. There is no guarding or rebound.  ?Musculoskeletal:     ?   General: No tenderness. Normal range of motion.  ?   Cervical back: Normal range of motion.  ?Lymphadenopathy:  ?   Cervical: No cervical adenopathy.  ?Skin: ?   General: Skin is warm and dry.  ?   Findings: No rash.  ?Neurological:  ?   Mental Status: He is alert and oriented to person, place, and time.  ?   Cranial Nerves: No cranial nerve deficit.  ?   Motor: No abnormal muscle tone.  ?   Coordination: Coordination normal.  ?   Gait: Gait normal.  ?   Deep Tendon Reflexes: Reflexes are normal and symmetric.  ?Psychiatric:     ?   Behavior: Behavior normal.     ?   Thought Content: Thought content normal.     ?   Judgment: Judgment normal.  ?LS is stiff ? ?Lab Results  ?Component Value Date  ? WBC 8.8 08/25/2019  ? HGB 13.3 08/25/2019  ? HCT 40.8 08/25/2019  ? PLT 237.0 08/25/2019  ? GLUCOSE 142 (H) 09/01/2021  ? CHOL 178 03/17/2021  ? TRIG 255.0 (H) 03/17/2021  ? HDL 42.90 03/17/2021  ? LDLDIRECT 59.0 03/17/2021  ? Timothy Schmidt 59 01/02/2014  ? ALT 10 09/01/2021  ? AST 13 09/01/2021  ? NA 140 09/01/2021  ? K 3.7 09/01/2021  ? CL 104 09/01/2021  ? CREATININE 1.36 09/01/2021  ? BUN 13 09/01/2021  ? CO2 28 09/01/2021  ? TSH 1.31 01/01/2018  ? PSA 12.24 (H) 10/31/2021  ? HGBA1C 7.3 (H) 09/01/2021  ? MICROALBUR 0.7 11/24/2016  ? ? ?DG Chest 2 View ? ?Result Date: 09/22/2010 ?*RADIOLOGY REPORT* Clinical Data: Shoulder spasm was obtained. CHEST - 2 VIEW Comparison: 08/05/2009 Findings: Normal heart size.  Linear scar at the left base.  Right  lung is clear.  No pneumothorax or pleural effusion.  No acute bony deformity. IMPRESSION: No active cardiopulmonary disease. Original Report Authenticated By: Jamas Lav, M.D. ? ?DG Shoulder Left ? ?Result Date: 09/22/2010 ?*RADIOLOGY REPORT* Clinical Data: Shoulder spasm was and pain LEFT SHOULDER - 2+ VIEW Comparison: None. Findings: No acute fracture.  No dislocation.  Unremarkable soft tissues. IMPRESSION:  No acute bony pathology. Original Report Authenticated By: Jamas Lav, M.D. ? ? ?Assessment & Plan:  ? ?Problem List Items Addressed This Visit   ? ? Essential hypertension  ?  Chronic ?Cont on Triamt/HCT ?  ?  ? Caries  ?  Extensive. He hasn't come up with money yet in 2023. He missed his dental appts (free). ?He needs to see a dentist - [employer] is suggested again ?PCN Rx given ?  ?  ? LOW BACK PAIN  ?  Norco prn ? Potential benefits of a long term prn opioids use as well as potential risks (i.e. addiction risk, apnea etc) and complications (i.e. Somnolence, constipation and others) were explained to the patient and were aknowledged. ?  ?  ? Obesity  ?  Wt Readings from Last 3 Encounters:  ?12/01/21 211 lb (95.7 kg)  ?10/31/21 207 lb (93.9 kg)  ?10/19/21 208 lb (94.3 kg)  ?Go on a diet ? ?  ?  ? Elevated PSA - Primary  ?  Worse ?Will ref to Dr Hazle Nordmann ?  ?  ? Relevant Orders  ? Ambulatory referral to Urology  ?  ? ? ?No orders of the defined types were placed in this encounter. ?  ? ? ?Follow-up: Return in about 3 months (around 03/02/2022) for a follow-up visit. ? ?Walker Kehr, MD ?

## 2021-12-01 NOTE — Assessment & Plan Note (Signed)
Extensive. He hasn't come up with money yet in 2023. He missed his dental appts (free). He needs to see a dentist - [employer] is suggested again PCN Rx given 

## 2021-12-01 NOTE — Assessment & Plan Note (Signed)
Wt Readings from Last 3 Encounters:  ?12/01/21 211 lb (95.7 kg)  ?10/31/21 207 lb (93.9 kg)  ?10/19/21 208 lb (94.3 kg)  ?Go on a diet ? ?

## 2021-12-03 LAB — PMP SCREEN PROFILE (10S), URINE
Amphetamine Scrn, Ur: NEGATIVE ng/mL
BARBITURATE SCREEN URINE: NEGATIVE ng/mL
BENZODIAZEPINE SCREEN, URINE: NEGATIVE ng/mL
CANNABINOIDS UR QL SCN: NEGATIVE ng/mL
Cocaine (Metab) Scrn, Ur: NEGATIVE ng/mL
Creatinine(Crt), U: 123.4 mg/dL (ref 20.0–300.0)
Methadone Screen, Urine: NEGATIVE ng/mL
OXYCODONE+OXYMORPHONE UR QL SCN: NEGATIVE ng/mL
Opiate Scrn, Ur: NEGATIVE ng/mL
Ph of Urine: 6.9 (ref 4.5–8.9)
Phencyclidine Qn, Ur: NEGATIVE ng/mL
Propoxyphene Scrn, Ur: NEGATIVE ng/mL

## 2021-12-08 ENCOUNTER — Telehealth: Payer: Self-pay | Admitting: Internal Medicine

## 2021-12-08 NOTE — Telephone Encounter (Signed)
A representative of Dr.Puschinsky calls today in regards to PT's referral to their office. The referral was made in response to PT's elevated PSA, but the office requires the most recent lab for PT that shows those readings. Please fax these results to them at ? ?Fax: [phone] ? ?CB: [phone] ?

## 2021-12-08 NOTE — Telephone Encounter (Signed)
Faxed last PSA to fax # below.Marland KitchenRaechel Chute ?

## 2021-12-14 ENCOUNTER — Telehealth: Payer: Self-pay | Admitting: Internal Medicine

## 2021-12-14 NOTE — Telephone Encounter (Signed)
1.Medication Requested: HYDROcodone-acetaminophen (NORCO/VICODIN) 5-325 MG tablet ? ?2. Pharmacy (Name, Street, [place]): [facility] - [place], [place] - [address] ? ?3. On Med List: Y ? ?4. Last Visit with PCP: 12-01-2021 ? ?5. Next visit date with PCP: 03-02-2022 ? ? ?Agent: Please be advised that RX refills may take up to 3 business days. We ask that you follow-up with your pharmacy.  ?

## 2021-12-15 NOTE — Telephone Encounter (Signed)
Check [place] registry last filled 11/21/2021.Marland KitchenJohny Schmidt ? ?

## 2021-12-16 MED ORDER — HYDROCODONE-ACETAMINOPHEN 5-325 MG PO TABS: 1.0000 | ORAL_TABLET | Freq: Two times a day (BID) | ORAL | 0 refills | Status: DC | PRN

## 2021-12-16 NOTE — Telephone Encounter (Signed)
Okay.  Thanks.

## 2021-12-16 NOTE — Telephone Encounter (Signed)
MD sent refill to [facility] 

## 2021-12-28 ENCOUNTER — Ambulatory Visit (INDEPENDENT_AMBULATORY_CARE_PROVIDER_SITE_OTHER): Payer: Medicare HMO | Admitting: Internal Medicine

## 2021-12-28 ENCOUNTER — Ambulatory Visit (INDEPENDENT_AMBULATORY_CARE_PROVIDER_SITE_OTHER): Payer: Medicare HMO

## 2021-12-28 ENCOUNTER — Encounter: Payer: Self-pay | Admitting: Internal Medicine

## 2021-12-28 VITALS — BP 110/67 | HR 99 | Temp 98.8°F | Ht 67.5 in | Wt 210.0 lb

## 2021-12-28 DIAGNOSIS — S63501A Unspecified sprain of right wrist, initial encounter: Secondary | ICD-10-CM

## 2021-12-28 DIAGNOSIS — S0081XA Abrasion of other part of head, initial encounter: Secondary | ICD-10-CM | POA: Diagnosis not present

## 2021-12-28 DIAGNOSIS — W19XXXA Unspecified fall, initial encounter: Secondary | ICD-10-CM

## 2021-12-28 DIAGNOSIS — Z23 Encounter for immunization: Secondary | ICD-10-CM | POA: Diagnosis not present

## 2021-12-28 DIAGNOSIS — M25531 Pain in right wrist: Secondary | ICD-10-CM | POA: Diagnosis not present

## 2021-12-28 NOTE — Assessment & Plan Note (Addendum)
ACE wrap applied ?X ray ?Norco po prn ? ?

## 2021-12-28 NOTE — Progress Notes (Signed)
? ?Subjective:  ?Patient ID: Timothy Schmidt, male    DOB: 1955/10/06  Age: 66 y.o. MRN: [id number] ? ?CC: No chief complaint on file. ? ? ?HPI ?Timothy Schmidt presents for a fall on Sun last week (12/25/21) when he was walking out of [employer] store, fell after the cart with a battery he was pushing flipped off the edge - hit his chin, R side, R hand. No LOC. ? ?Outpatient Medications Prior to Visit  ?Medication Sig Dispense Refill  ? Alcohol Swabs (B-D SINGLE USE SWABS BUTTERFLY) PADS Use one two times daily.Dx: 250.00 100 each 3  ? Ascorbic Acid (VITAMIN C WITH ROSE HIPS) 1000 MG tablet Take 1,000 mg by mouth daily.    ? aspirin EC 81 MG tablet Take 81 mg by mouth daily.    ? Blood Glucose Calibration (PRODIGY CONTROL SOLUTION) LOW SOLN Use as directed per meter instructions. 3 each 0  ? Blood Glucose Monitoring Suppl (PRODIGY AUTOCODE BLOOD GLUCOSE) W/DEVICE KIT Use two times daily as directed. 1 each 0  ? cholecalciferol (VITAMIN D) 1000 UNITS tablet Take 1 tablet (1,000 Units total) by mouth daily. 90 tablet 3  ? cyanocobalamin 1000 MCG tablet Take 0.5 tablets (500 mcg total) by mouth daily. 90 tablet 3  ? glipiZIDE (GLUCOTROL) 5 MG tablet TAKE 1 TABLET BY MOUTH TWICE DAILY BEFORE MEAL(S) 180 tablet 3  ? glucose blood (PRODIGY AUTOCODE TEST) test strip Use one two times daily. Dx: 250.00 100 each 3  ? guaiFENesin-Codeine (DIABETIC TUSSIN C PO) Take by mouth as needed.    ? HYDROcodone-acetaminophen (NORCO/VICODIN) 5-325 MG tablet Take 1 tablet by mouth 2 (two) times daily as needed for severe pain. 60 tablet 0  ? metFORMIN (GLUCOPHAGE) 1000 MG tablet Take 1 tablet (1,000 mg total) by mouth 2 (two) times daily with a meal. 180 tablet 3  ? Misc Natural Products (CVS PROSTATE MAX +) TABS Take 1 tablet by mouth 2 (two) times daily.    ? penicillin v potassium (VEETID) 500 MG tablet TAKE ONE TABLET BY MOUTH FOUR TIMES DAILY 120 tablet 0  ? pioglitazone (ACTOS) 30 MG tablet Take 1 tablet by mouth once  daily 90 tablet 3  ? potassium chloride SA (KLOR-CON M) 20 MEQ tablet Take 1 tablet by mouth once daily 90 tablet 3  ? PRODIGY TWIST TOP LANCETS 28G MISC Use one two times daily. Dx: 250.00 100 each 3  ? triamterene-hydrochlorothiazide (MAXZIDE-25) 37.5-25 MG tablet Take 1 tablet by mouth daily. 90 tablet 3  ? ?No facility-administered medications prior to visit.  ? ? ?ROS: ?Review of Systems ? ?Objective:  ?BP 110/67 (BP Location: Left Arm, Patient Position: Sitting, Cuff Size: Normal)   Pulse 99   Temp 98.8 ?F (37.1 ?C) (Oral)   Ht 5' 7.5" (1.715 m)   Wt 210 lb (95.3 kg)   SpO2 96%   BMI 32.41 kg/m?  ? ?BP Readings from Last 3 Encounters:  ?12/28/21 110/67  ?12/01/21 130/86  ?10/31/21 (!) 158/98  ? ? ?Wt Readings from Last 3 Encounters:  ?12/28/21 210 lb (95.3 kg)  ?12/01/21 211 lb (95.7 kg)  ?10/31/21 207 lb (93.9 kg)  ? ? ?Physical Exam ?R wrist w/pain/swelling ?R shoulder is sensitive w/ROM ?1 cm abrasion on the chin ? ?Lab Results  ?Component Value Date  ? WBC 8.8 08/25/2019  ? HGB 13.3 08/25/2019  ? HCT 40.8 08/25/2019  ? PLT 237.0 08/25/2019  ? GLUCOSE 142 (H) 09/01/2021  ? CHOL 178 03/17/2021  ?  TRIG 255.0 (H) 03/17/2021  ? HDL 42.90 03/17/2021  ? LDLDIRECT 59.0 03/17/2021  ? [place] 59 01/02/2014  ? ALT 10 09/01/2021  ? AST 13 09/01/2021  ? NA 140 09/01/2021  ? K 3.7 09/01/2021  ? CL 104 09/01/2021  ? CREATININE 1.36 09/01/2021  ? BUN 13 09/01/2021  ? CO2 28 09/01/2021  ? TSH 1.31 01/01/2018  ? PSA 12.24 (H) 10/31/2021  ? HGBA1C 7.3 (H) 09/01/2021  ? MICROALBUR 0.7 11/24/2016  ? ? ?DG Chest 2 View ? ?Result Date: 09/22/2010 ?*RADIOLOGY REPORT* Clinical Data: Shoulder spasm was obtained. CHEST - 2 VIEW Comparison: 08/05/2009 Findings: Normal heart size.  Linear scar at the left base.  Right lung is clear.  No pneumothorax or pleural effusion.  No acute bony deformity. IMPRESSION: No active cardiopulmonary disease. Original Report Authenticated By: Jamas Lav, M.D. ? ?DG Shoulder Left ? ?Result Date:  09/22/2010 ?*RADIOLOGY REPORT* Clinical Data: Shoulder spasm was and pain LEFT SHOULDER - 2+ VIEW Comparison: None. Findings: No acute fracture.  No dislocation.  Unremarkable soft tissues. IMPRESSION: No acute bony pathology. Original Report Authenticated By: Jamas Lav, M.D. ? ? ?Assessment & Plan:  ? ?Problem List Items Addressed This Visit   ? ? Fall on hard surface - Primary  ?  S/p a fall on Sun last week (12/25/21) when he was walking out of [employer] store, fell after the cart with a battery he was pushing flipped off the edge - hit his chin, R side, R hand. No LOC. ?  ?  ? Wrist sprain, right, initial encounter  ?  ACE wrap applied ?X ray ?Norco po prn ? ? ?  ?  ? Relevant Orders  ? DG Wrist Complete Right  ? Abrasion of chin  ?  S/p fall ?Abx oint  ?tDAP  ? ? ?  ?  ? Relevant Orders  ? Tdap vaccine greater than or equal to 7yo IM (Completed)  ?  ? ? ?No orders of the defined types were placed in this encounter. ?  ? ? ?Follow-up: No follow-ups on file. ? ?Walker Kehr, MD ?

## 2021-12-28 NOTE — Assessment & Plan Note (Addendum)
S/p a fall on Sun last week (12/25/21) when he was walking out of [employer] store, fell after the cart with a battery he was pushing flipped off the edge - hit his chin, R side, R hand. No LOC. ?

## 2021-12-28 NOTE — Assessment & Plan Note (Signed)
S/p fall ?Abx oint  ?tDAP  ? ?

## 2022-01-12 DIAGNOSIS — R339 Retention of urine, unspecified: Secondary | ICD-10-CM | POA: Diagnosis not present

## 2022-01-12 DIAGNOSIS — R972 Elevated prostate specific antigen [PSA]: Secondary | ICD-10-CM | POA: Diagnosis not present

## 2022-01-17 ENCOUNTER — Telehealth: Payer: Self-pay | Admitting: Internal Medicine

## 2022-01-17 NOTE — Telephone Encounter (Signed)
Pt called in and states he is wanting to have a second opinion from his urologist referral.   States he wanted to make MD aware- he will call back once he decides where he wants to go.   Also, needs refill on HYDROcodone-acetaminophen (NORCO/VICODIN) 5-325 MG tablet.  [facility] - [place], [place] - [address] Phone:  [phone]  Fax:  [phone]

## 2022-01-19 MED ORDER — HYDROCODONE-ACETAMINOPHEN 5-325 MG PO TABS: 1.0000 | ORAL_TABLET | Freq: Two times a day (BID) | ORAL | 0 refills | Status: DC | PRN

## 2022-01-19 NOTE — Telephone Encounter (Signed)
Okay.  Thanks.

## 2022-02-15 ENCOUNTER — Telehealth: Payer: Self-pay | Admitting: Internal Medicine

## 2022-02-15 NOTE — Telephone Encounter (Signed)
Pt requesting refill     HYDROcodone-acetaminophen (NORCO/VICODIN) 5-325 MG tablet   [facility] - [place], [place] - [address] Phone:  [phone]  Fax:  [phone]

## 2022-02-16 MED ORDER — HYDROCODONE-ACETAMINOPHEN 5-325 MG PO TABS: 1.0000 | ORAL_TABLET | Freq: Two times a day (BID) | ORAL | 0 refills | Status: DC | PRN

## 2022-02-16 NOTE — Telephone Encounter (Signed)
Okay.  Done.  Thanks 

## 2022-02-20 ENCOUNTER — Encounter: Payer: Self-pay | Admitting: Internal Medicine

## 2022-02-20 ENCOUNTER — Ambulatory Visit (INDEPENDENT_AMBULATORY_CARE_PROVIDER_SITE_OTHER): Payer: Medicare HMO | Admitting: Internal Medicine

## 2022-02-20 VITALS — BP 130/68 | HR 87 | Temp 98.6°F | Ht 67.5 in | Wt 208.0 lb

## 2022-02-20 DIAGNOSIS — F79 Unspecified intellectual disabilities: Secondary | ICD-10-CM

## 2022-02-20 DIAGNOSIS — M5440 Lumbago with sciatica, unspecified side: Secondary | ICD-10-CM

## 2022-02-20 DIAGNOSIS — E1142 Type 2 diabetes mellitus with diabetic polyneuropathy: Secondary | ICD-10-CM | POA: Diagnosis not present

## 2022-02-20 DIAGNOSIS — G8929 Other chronic pain: Secondary | ICD-10-CM | POA: Diagnosis not present

## 2022-02-20 DIAGNOSIS — F4323 Adjustment disorder with mixed anxiety and depressed mood: Secondary | ICD-10-CM

## 2022-02-20 DIAGNOSIS — R972 Elevated prostate specific antigen [PSA]: Secondary | ICD-10-CM | POA: Diagnosis not present

## 2022-02-20 LAB — COMPREHENSIVE METABOLIC PANEL
ALT: 10 U/L (ref 0–53)
AST: 14 U/L (ref 0–37)
Albumin: 4.5 g/dL (ref 3.5–5.2)
Alkaline Phosphatase: 42 U/L (ref 39–117)
BUN: 16 mg/dL (ref 6–23)
CO2: 27 mEq/L (ref 19–32)
Calcium: 10.5 mg/dL (ref 8.4–10.5)
Chloride: 102 mEq/L (ref 96–112)
Creatinine, Ser: 1.37 mg/dL (ref 0.40–1.50)
GFR: 54 mL/min — ABNORMAL LOW (ref >60.00)
Glucose, Bld: 102 mg/dL — ABNORMAL HIGH (ref 70–99)
Potassium: 3.8 mEq/L (ref 3.5–5.1)
Sodium: 138 mEq/L (ref 135–145)
Total Bilirubin: 0.4 mg/dL (ref 0.2–1.2)
Total Protein: 7.4 g/dL (ref 6.0–8.3)

## 2022-02-20 LAB — HEMOGLOBIN A1C: Hgb A1c MFr Bld: 6.9 % — ABNORMAL HIGH (ref 4.6–6.5)

## 2022-02-20 NOTE — Assessment & Plan Note (Signed)
Timothy Schmidt needs to retain Timothy Schmidt as his legal guardian due to his MR and adjustment disorder.

## 2022-02-20 NOTE — Progress Notes (Signed)
Subjective:  Patient ID: Timothy Schmidt, male    DOB: 12-17-1955  Age: 66 y.o. MRN: [id number]  CC: No chief complaint on file.   HPI Timothy Schmidt presents for stress, MR    Outpatient Medications Prior to Visit  Medication Sig Dispense Refill   Alcohol Swabs (B-D SINGLE USE SWABS BUTTERFLY) PADS Use one two times daily.Dx: 250.00 100 each 3   Ascorbic Acid (VITAMIN C WITH ROSE HIPS) 1000 MG tablet Take 1,000 mg by mouth daily.     aspirin EC 81 MG tablet Take 81 mg by mouth daily.     Blood Glucose Calibration (PRODIGY CONTROL SOLUTION) LOW SOLN Use as directed per meter instructions. 3 each 0   Blood Glucose Monitoring Suppl (PRODIGY AUTOCODE BLOOD GLUCOSE) W/DEVICE KIT Use two times daily as directed. 1 each 0   cholecalciferol (VITAMIN D) 1000 UNITS tablet Take 1 tablet (1,000 Units total) by mouth daily. 90 tablet 3   cyanocobalamin 1000 MCG tablet Take 0.5 tablets (500 mcg total) by mouth daily. 90 tablet 3   glipiZIDE (GLUCOTROL) 5 MG tablet TAKE 1 TABLET BY MOUTH TWICE DAILY BEFORE MEAL(S) 180 tablet 3   glucose blood (PRODIGY AUTOCODE TEST) test strip Use one two times daily. Dx: 250.00 100 each 3   guaiFENesin-Codeine (DIABETIC TUSSIN C PO) Take by mouth as needed.     HYDROcodone-acetaminophen (NORCO/VICODIN) 5-325 MG tablet Take 1 tablet by mouth 2 (two) times daily as needed for severe pain. 60 tablet 0   metFORMIN (GLUCOPHAGE) 1000 MG tablet Take 1 tablet (1,000 mg total) by mouth 2 (two) times daily with a meal. 180 tablet 3   Misc Natural Products (CVS PROSTATE MAX +) TABS Take 1 tablet by mouth 2 (two) times daily.     penicillin v potassium (VEETID) 500 MG tablet TAKE ONE TABLET BY MOUTH FOUR TIMES DAILY 120 tablet 0   pioglitazone (ACTOS) 30 MG tablet Take 1 tablet by mouth once daily 90 tablet 3   potassium chloride SA (KLOR-CON M) 20 MEQ tablet Take 1 tablet by mouth once daily 90 tablet 3   PRODIGY TWIST TOP LANCETS 28G MISC Use one two times daily. Dx:  250.00 100 each 3   triamterene-hydrochlorothiazide (MAXZIDE-25) 37.5-25 MG tablet Take 1 tablet by mouth daily. 90 tablet 3   No facility-administered medications prior to visit.    ROS: Review of Systems  Constitutional:  Negative for appetite change, fatigue and unexpected weight change.  HENT:  Negative for congestion, nosebleeds, sneezing, sore throat and trouble swallowing.   Eyes:  Negative for itching and visual disturbance.  Respiratory:  Negative for cough.   Cardiovascular:  Negative for chest pain, palpitations and leg swelling.  Gastrointestinal:  Negative for abdominal distention, blood in stool, diarrhea and nausea.  Genitourinary:  Negative for frequency and hematuria.  Musculoskeletal:  Positive for back pain. Negative for gait problem, joint swelling and neck pain.  Skin:  Negative for rash.  Neurological:  Negative for dizziness, tremors, speech difficulty and weakness.  Psychiatric/Behavioral:  Positive for decreased concentration. Negative for agitation, dysphoric mood, sleep disturbance and suicidal ideas. The patient is nervous/anxious.     Objective:  BP 130/68 (BP Location: Left Arm, Patient Position: Sitting, Cuff Size: Normal)   Pulse 87   Temp 98.6 F (37 C) (Oral)   Ht 5' 7.5" (1.715 m)   Wt 208 lb (94.3 kg)   SpO2 94%   BMI 32.10 kg/m   BP Readings from Last 3 Encounters:  02/20/22 130/68  12/28/21 110/67  12/01/21 130/86    Wt Readings from Last 3 Encounters:  02/20/22 208 lb (94.3 kg)  12/28/21 210 lb (95.3 kg)  12/01/21 211 lb (95.7 kg)    Physical Exam Constitutional:      General: He is not in acute distress.    Appearance: He is well-developed. He is obese.     Comments: NAD  Eyes:     Conjunctiva/sclera: Conjunctivae normal.     Pupils: Pupils are equal, round, and reactive to light.  Neck:     Thyroid: No thyromegaly.     Vascular: No JVD.  Cardiovascular:     Rate and Rhythm: Normal rate and regular rhythm.     Heart  sounds: Normal heart sounds. No murmur heard.    No friction rub. No gallop.  Pulmonary:     Effort: Pulmonary effort is normal. No respiratory distress.     Breath sounds: Normal breath sounds. No wheezing or rales.  Chest:     Chest wall: No tenderness.  Abdominal:     General: Bowel sounds are normal. There is no distension.     Palpations: Abdomen is soft. There is no mass.     Tenderness: There is no abdominal tenderness. There is no guarding or rebound.  Musculoskeletal:        General: No tenderness. Normal range of motion.     Cervical back: Normal range of motion.  Lymphadenopathy:     Cervical: No cervical adenopathy.  Skin:    General: Skin is warm and dry.     Findings: No rash.  Neurological:     Mental Status: He is alert.     Cranial Nerves: No cranial nerve deficit.     Motor: No abnormal muscle tone.     Coordination: Coordination normal.     Gait: Gait normal.     Deep Tendon Reflexes: Reflexes are normal and symmetric.  Psychiatric:        Behavior: Behavior normal.        Thought Content: Thought content normal.     A total time of 30 minutes was spent preparing to see the patient, reviewing other medical and legal records.  Also, obtaining history and performing comprehensive physical exam.  Additionally, counseling the patient regarding the above listed issues.   Finally, documenting clinical information in the health records, coordination of care, educating the patient re stress.    Lab Results  Component Value Date   WBC 8.8 08/25/2019   HGB 13.3 08/25/2019   HCT 40.8 08/25/2019   PLT 237.0 08/25/2019   GLUCOSE 142 (H) 09/01/2021   CHOL 178 03/17/2021   TRIG 255.0 (H) 03/17/2021   HDL 42.90 03/17/2021   LDLDIRECT 59.0 03/17/2021   LDLCALC 59 01/02/2014   ALT 10 09/01/2021   AST 13 09/01/2021   NA 140 09/01/2021   K 3.7 09/01/2021   CL 104 09/01/2021   CREATININE 1.36 09/01/2021   BUN 13 09/01/2021   CO2 28 09/01/2021   TSH 1.31 01/01/2018    PSA 12.24 (H) 10/31/2021   HGBA1C 7.3 (H) 09/01/2021   MICROALBUR 0.7 11/24/2016    DG Chest 2 View  Result Date: 09/22/2010 *RADIOLOGY REPORT* Clinical Data: Shoulder spasm was obtained. CHEST - 2 VIEW Comparison: 08/05/2009 Findings: Normal heart size.  Linear scar at the left base.  Right lung is clear.  No pneumothorax or pleural effusion.  No acute bony deformity. IMPRESSION: No active cardiopulmonary disease. Original Report Authenticated  By: Jamas Lav, M.D.  DG Shoulder Left  Result Date: 09/22/2010 *RADIOLOGY REPORT* Clinical Data: Shoulder spasm was and pain LEFT SHOULDER - 2+ VIEW Comparison: None. Findings: No acute fracture.  No dislocation.  Unremarkable soft tissues. IMPRESSION: No acute bony pathology. Original Report Authenticated By: Jamas Lav, M.D.   Assessment & Plan:   Problem List Items Addressed This Visit     Adjustment disorder with mixed anxiety and depressed mood    Chronic Stress w/his house foreclosure. [place] needs to retain Michaell Cowing as his legal guardian due to his MR and adjustment disorder.      Intellectual disability    [place] needs to retain Michaell Cowing as his legal guardian due to his MR and adjustment disorder.      LOW BACK PAIN      No orders of the defined types were placed in this encounter.     Follow-up: No follow-ups on file.  Walker Kehr, MD

## 2022-02-20 NOTE — Assessment & Plan Note (Signed)
Chronic Stress w/his house foreclosure. [name] needs to retain Timothy Schmidt as his legal guardian due to his MR and adjustment disorder.

## 2022-02-21 ENCOUNTER — Other Ambulatory Visit: Payer: Self-pay

## 2022-02-21 ENCOUNTER — Encounter (HOSPITAL_BASED_OUTPATIENT_CLINIC_OR_DEPARTMENT_OTHER): Payer: Self-pay | Admitting: Emergency Medicine

## 2022-02-21 ENCOUNTER — Emergency Department (HOSPITAL_BASED_OUTPATIENT_CLINIC_OR_DEPARTMENT_OTHER): Payer: Medicare HMO

## 2022-02-21 ENCOUNTER — Emergency Department (HOSPITAL_BASED_OUTPATIENT_CLINIC_OR_DEPARTMENT_OTHER)
Admission: EM | Admit: 2022-02-21 | Discharge: 2022-02-22 | Disposition: A | Discharge status: Home / Self Care | Payer: Medicare HMO | Attending: Emergency Medicine | Admitting: Emergency Medicine

## 2022-02-21 DIAGNOSIS — N200 Calculus of kidney: Secondary | ICD-10-CM | POA: Diagnosis not present

## 2022-02-21 DIAGNOSIS — Z7982 Long term (current) use of aspirin: Secondary | ICD-10-CM | POA: Insufficient documentation

## 2022-02-21 DIAGNOSIS — K429 Umbilical hernia without obstruction or gangrene: Secondary | ICD-10-CM | POA: Diagnosis not present

## 2022-02-21 DIAGNOSIS — N400 Enlarged prostate without lower urinary tract symptoms: Secondary | ICD-10-CM | POA: Diagnosis not present

## 2022-02-21 DIAGNOSIS — K4020 Bilateral inguinal hernia, without obstruction or gangrene, not specified as recurrent: Secondary | ICD-10-CM | POA: Diagnosis not present

## 2022-02-21 DIAGNOSIS — R1033 Periumbilical pain: Secondary | ICD-10-CM | POA: Diagnosis present

## 2022-02-21 LAB — CBC WITH DIFFERENTIAL/PLATELET
Abs Immature Granulocytes: 0.02 10*3/uL (ref 0.00–0.07)
Basophils Absolute: 0.1 10*3/uL (ref 0.0–0.1)
Basophils Relative: 1 %
Eosinophils Absolute: 0.2 10*3/uL (ref 0.0–0.5)
Eosinophils Relative: 2 %
HCT: 40.5 % (ref 39.0–52.0)
Hemoglobin: 13.7 g/dL (ref 13.0–17.0)
Immature Granulocytes: 0 %
Lymphocytes Relative: 36 %
Lymphs Abs: 3.5 10*3/uL (ref 0.7–4.0)
MCH: 31.7 pg (ref 26.0–34.0)
MCHC: 33.8 g/dL (ref 30.0–36.0)
MCV: 93.8 fL (ref 80.0–100.0)
Monocytes Absolute: 0.7 10*3/uL (ref 0.1–1.0)
Monocytes Relative: 8 %
Neutro Abs: 5.4 10*3/uL (ref 1.7–7.7)
Neutrophils Relative %: 53 %
Platelets: 266 10*3/uL (ref 150–400)
RBC: 4.32 MIL/uL (ref 4.22–5.81)
RDW: 14.6 % (ref 11.5–15.5)
WBC: 9.9 10*3/uL (ref 4.0–10.5)
nRBC: 0 % (ref 0.0–0.2)

## 2022-02-21 NOTE — ED Triage Notes (Signed)
Pt states his hernia came out and is hurting and burning  Pt states it is an umbilical hernia  Pt states he took a pain pill earlier but it did not help the pain

## 2022-02-21 NOTE — ED Provider Notes (Signed)
[facility] Provider Note   CSN: [id number] Arrival date & time: 02/21/22  2322     History {Add pertinent medical, surgical, social history, OB history to HPI:1} Chief Complaint  Patient presents with   Abdominal Pain    Timothy Schmidt is a 66 y.o. male.  The history is provided by the patient.  Abdominal Pain Pain location:  Periumbilical Pain quality: aching   Pain radiates to:  Does not radiate Pain severity:  Moderate Onset quality:  Gradual Duration:  104 weeks Timing:  Constant Progression:  Waxing and waning Chronicity:  Chronic Context: not trauma   Relieved by:  Nothing Worsened by:  Nothing Ineffective treatments:  None tried Associated symptoms: no anorexia   Risk factors: no alcohol abuse        Home Medications Prior to Admission medications   Medication Sig Start Date End Date Taking? Authorizing Provider  Alcohol Swabs (B-D SINGLE USE SWABS BUTTERFLY) PADS Use one two times daily.Dx: 250.00 10/15/13   Plotnikov, Evie Lacks, MD  Ascorbic Acid (VITAMIN C WITH ROSE HIPS) 1000 MG tablet Take 1,000 mg by mouth daily.    [provider]  aspirin EC 81 MG tablet Take 81 mg by mouth daily.    [provider]  Blood Glucose Calibration (PRODIGY CONTROL SOLUTION) LOW SOLN Use as directed per meter instructions. 10/15/13   Plotnikov, Evie Lacks, MD  Blood Glucose Monitoring Suppl (PRODIGY AUTOCODE BLOOD GLUCOSE) W/DEVICE KIT Use two times daily as directed. 10/15/13   Plotnikov, Evie Lacks, MD  cholecalciferol (VITAMIN D) 1000 UNITS tablet Take 1 tablet (1,000 Units total) by mouth daily. 03/22/11   Plotnikov, Evie Lacks, MD  cyanocobalamin 1000 MCG tablet Take 0.5 tablets (500 mcg total) by mouth daily. 09/25/13   Plotnikov, Evie Lacks, MD  glipiZIDE (GLUCOTROL) 5 MG tablet TAKE 1 TABLET BY MOUTH TWICE DAILY BEFORE MEAL(S) 03/29/21   Plotnikov, Evie Lacks, MD  glucose blood (PRODIGY AUTOCODE TEST) test strip Use one two times  daily. Dx: 250.00 10/15/13   Plotnikov, Evie Lacks, MD  guaiFENesin-Codeine (DIABETIC TUSSIN C PO) Take by mouth as needed.    [provider]  HYDROcodone-acetaminophen (NORCO/VICODIN) 5-325 MG tablet Take 1 tablet by mouth 2 (two) times daily as needed for severe pain. 02/16/22   Plotnikov, Evie Lacks, MD  metFORMIN (GLUCOPHAGE) 1000 MG tablet Take 1 tablet (1,000 mg total) by mouth 2 (two) times daily with a meal. 03/29/21   Plotnikov, Evie Lacks, MD  Misc Natural Products ([employer] PROSTATE MAX +) TABS Take 1 tablet by mouth 2 (two) times daily.    [provider]  penicillin v potassium (VEETID) 500 MG tablet TAKE ONE TABLET BY MOUTH FOUR TIMES DAILY 10/31/21   Plotnikov, Evie Lacks, MD  pioglitazone (ACTOS) 30 MG tablet Take 1 tablet by mouth once daily 11/30/21   Plotnikov, Evie Lacks, MD  potassium chloride SA (KLOR-CON M) 20 MEQ tablet Take 1 tablet by mouth once daily 11/30/21   Plotnikov, Evie Lacks, MD  PRODIGY TWIST TOP LANCETS 28G MISC Use one two times daily. Dx: 250.00 10/15/13   Plotnikov, Evie Lacks, MD  triamterene-hydrochlorothiazide (MAXZIDE-25) 37.5-25 MG tablet Take 1 tablet by mouth daily. 07/21/21   Plotnikov, Evie Lacks, MD      Allergies    Patient has no known allergies.    Review of Systems   Review of Systems  HENT:  Negative for facial swelling.   Eyes:  Negative for redness.  Respiratory:  Negative  for wheezing and stridor.   Gastrointestinal:  Positive for abdominal pain. Negative for anorexia.  All other systems reviewed and are negative.   Physical Exam Updated Vital Signs BP (!) 149/93 (BP Location: Left Arm)   Pulse (!) 106   Temp 99.9 F (37.7 C) (Oral)   Resp 20   Ht '5\' 8"'  (1.727 m)   Wt 94.3 kg   SpO2 100%   BMI 31.63 kg/m  Physical Exam Vitals and nursing note reviewed.  Constitutional:      General: He is not in acute distress.    Appearance: He is well-developed. He is not diaphoretic.  HENT:     Head: Normocephalic and atraumatic.   Eyes:     Conjunctiva/sclera: Conjunctivae normal.     Pupils: Pupils are equal, round, and reactive to light.  Cardiovascular:     Rate and Rhythm: Normal rate and regular rhythm.  Pulmonary:     Effort: Pulmonary effort is normal.     Breath sounds: Normal breath sounds. No wheezing or rales.  Abdominal:     General: Bowel sounds are normal.     Palpations: Abdomen is soft.     Tenderness: There is no abdominal tenderness. There is no guarding or rebound.  Musculoskeletal:        General: Normal range of motion.     Cervical back: Normal range of motion and neck supple.  Skin:    General: Skin is warm and dry.     Capillary Refill: Capillary refill takes less than 2 seconds.  Neurological:     General: No focal deficit present.     Mental Status: He is alert and oriented to person, place, and time.     ED Results / Procedures / Treatments   Labs (all labs ordered are listed, but only abnormal results are displayed) Labs Reviewed  CBC WITH DIFFERENTIAL/PLATELET  BASIC METABOLIC PANEL    EKG None  Radiology No results found.  Procedures Procedures  {Document cardiac monitor, telemetry assessment procedure when appropriate:1}  Medications Ordered in ED Medications - No data to display  ED Course/ Medical Decision Making/ A&P                           Medical Decision Making Umbilical hernia x 2 years   Amount and/or Complexity of Data Reviewed External Data Reviewed: notes. Labs: ordered. Radiology: ordered.    Final Clinical Impression(s) / ED Diagnoses Final diagnoses:  None   Return for intractable cough, coughing up blood, fevers > 100.4 unrelieved by medication, shortness of breath, intractable vomiting, chest pain, shortness of breath, weakness, numbness, changes in speech, facial asymmetry, abdominal pain, passing out, Inability to tolerate liquids or food, cough, altered mental status or any concerns. No signs of systemic illness or infection.  The patient is nontoxic-appearing on exam and vital signs are within normal limits.  I have reviewed the triage vital signs and the nursing notes. Pertinent labs & imaging results that were available during my care of the patient were reviewed by me and considered in my medical decision making (see chart for details). After history, exam, and medical workup I feel the patient has been appropriately medically screened and is safe for discharge home. Pertinent diagnoses were  Rx / DC Orders ED Discharge Orders     None

## 2022-02-22 ENCOUNTER — Telehealth: Payer: Self-pay | Admitting: Internal Medicine

## 2022-02-22 LAB — BASIC METABOLIC PANEL WITH GFR
Anion gap: 11 (ref 5–15)
BUN: 18 mg/dL (ref 8–23)
CO2: 22 mmol/L (ref 22–32)
Calcium: 9.8 mg/dL (ref 8.9–10.3)
Chloride: 105 mmol/L (ref 98–111)
Creatinine, Ser: 1.42 mg/dL — ABNORMAL HIGH (ref 0.61–1.24)
GFR, Estimated: 55 mL/min — ABNORMAL LOW
Glucose, Bld: 189 mg/dL — ABNORMAL HIGH (ref 70–99)
Potassium: 3.7 mmol/L (ref 3.5–5.1)
Sodium: 138 mmol/L (ref 135–145)

## 2022-02-22 MED ORDER — KETOROLAC TROMETHAMINE 60 MG/2ML IM SOLN
60.0000 mg | Freq: Once | INTRAMUSCULAR | Status: AC
Start: 2022-02-22 — End: 2022-02-22
  Administered 2022-02-22: 15 mg via INTRAMUSCULAR
  Filled 2022-02-22: qty 2

## 2022-02-22 NOTE — Telephone Encounter (Signed)
Pt.requesting  Excuse for missing Court  today 02/22/22 due to ER visit for Umbilical hernia x 2 years     Please Advise

## 2022-02-22 NOTE — ED Notes (Signed)
Patient verbalizes understanding of discharge instructions. Opportunity for questioning and answers were provided. Armband removed by staff, pt discharged from ED. Wheeled out to lobby and assisted to car

## 2022-02-23 LAB — PMP SCREEN PROFILE (10S), URINE
Amphetamine Scrn, Ur: NEGATIVE ng/mL
BARBITURATE SCREEN URINE: NEGATIVE ng/mL
BENZODIAZEPINE SCREEN, URINE: NEGATIVE ng/mL
CANNABINOIDS UR QL SCN: NEGATIVE ng/mL
Cocaine (Metab) Scrn, Ur: NEGATIVE ng/mL
Creatinine(Crt), U: 137.4 mg/dL (ref 20.0–300.0)
Methadone Screen, Urine: NEGATIVE ng/mL
OXYCODONE+OXYMORPHONE UR QL SCN: NEGATIVE ng/mL
Opiate Scrn, Ur: NEGATIVE ng/mL
Ph of Urine: 4.8 (ref 4.5–8.9)
Phencyclidine Qn, Ur: NEGATIVE ng/mL
Propoxyphene Scrn, Ur: NEGATIVE ng/mL

## 2022-02-23 LAB — PSA, TOTAL AND FREE
PSA, Free: 3.5 ng/mL
PSA, Total: 13.3 ng/mL — ABNORMAL HIGH (ref <=4.0)

## 2022-02-23 NOTE — Telephone Encounter (Signed)
Please provide a note for Timothy Schmidt - he  was seen in the ER on 02/21/2022 for umbilical hernia.  Thank you

## 2022-03-01 ENCOUNTER — Encounter: Payer: Self-pay | Admitting: Internal Medicine

## 2022-03-01 ENCOUNTER — Ambulatory Visit (INDEPENDENT_AMBULATORY_CARE_PROVIDER_SITE_OTHER): Payer: Medicare HMO | Admitting: Internal Medicine

## 2022-03-01 DIAGNOSIS — R972 Elevated prostate specific antigen [PSA]: Secondary | ICD-10-CM | POA: Diagnosis not present

## 2022-03-01 DIAGNOSIS — E1142 Type 2 diabetes mellitus with diabetic polyneuropathy: Secondary | ICD-10-CM | POA: Diagnosis not present

## 2022-03-01 DIAGNOSIS — G8929 Other chronic pain: Secondary | ICD-10-CM

## 2022-03-01 DIAGNOSIS — K429 Umbilical hernia without obstruction or gangrene: Secondary | ICD-10-CM | POA: Diagnosis not present

## 2022-03-01 DIAGNOSIS — M5440 Lumbago with sciatica, unspecified side: Secondary | ICD-10-CM

## 2022-03-01 MED ORDER — PENICILLIN V POTASSIUM 500 MG PO TABS: ORAL_TABLET | ORAL | 0 refills | Status: DC

## 2022-03-01 MED ORDER — POTASSIUM CHLORIDE CRYS ER 20 MEQ PO TBCR: 20.0000 meq | EXTENDED_RELEASE_TABLET | Freq: Every day | ORAL | 3 refills | Status: DC

## 2022-03-01 MED ORDER — PIOGLITAZONE HCL 30 MG PO TABS
30.0000 mg | ORAL_TABLET | Freq: Every day | ORAL | 3 refills | Status: DC
Start: 2022-03-01 — End: 2023-03-01

## 2022-03-01 MED ORDER — METFORMIN HCL 1000 MG PO TABS
1000.0000 mg | ORAL_TABLET | Freq: Two times a day (BID) | ORAL | 3 refills | Status: DC
Start: 2022-03-01 — End: 2023-03-19

## 2022-03-01 NOTE — Assessment & Plan Note (Signed)
PSA 13 discussed w/Ann  Onalee Hua said he saw Dr Gerome Sam recently

## 2022-03-01 NOTE — Progress Notes (Signed)
Subjective:  Patient ID: Timothy Schmidt, male    DOB: 1955-10-31  Age: 66 y.o. MRN: [id number]  CC: No chief complaint on file.   HPI Timothy Schmidt presents for elevated PSA, hernia, LBP  Outpatient Medications Prior to Visit  Medication Sig Dispense Refill   Alcohol Swabs (B-D SINGLE USE SWABS BUTTERFLY) PADS Use one two times daily.Dx: 250.00 100 each 3   Ascorbic Acid (VITAMIN C WITH ROSE HIPS) 1000 MG tablet Take 1,000 mg by mouth daily.     aspirin EC 81 MG tablet Take 81 mg by mouth daily.     Blood Glucose Calibration (PRODIGY CONTROL SOLUTION) LOW SOLN Use as directed per meter instructions. 3 each 0   Blood Glucose Monitoring Suppl (PRODIGY AUTOCODE BLOOD GLUCOSE) W/DEVICE KIT Use two times daily as directed. 1 each 0   cholecalciferol (VITAMIN D) 1000 UNITS tablet Take 1 tablet (1,000 Units total) by mouth daily. 90 tablet 3   cyanocobalamin 1000 MCG tablet Take 0.5 tablets (500 mcg total) by mouth daily. 90 tablet 3   glipiZIDE (GLUCOTROL) 5 MG tablet TAKE 1 TABLET BY MOUTH TWICE DAILY BEFORE MEAL(S) 180 tablet 3   glucose blood (PRODIGY AUTOCODE TEST) test strip Use one two times daily. Dx: 250.00 100 each 3   guaiFENesin-Codeine (DIABETIC TUSSIN C PO) Take by mouth as needed.     HYDROcodone-acetaminophen (NORCO/VICODIN) 5-325 MG tablet Take 1 tablet by mouth 2 (two) times daily as needed for severe pain. 60 tablet 0   Misc Natural Products ([employer]S PROSTATE MAX +) TABS Take 1 tablet by mouth 2 (two) times daily.     PRODIGY TWIST TOP LANCETS 28G MISC Use one two times daily. Dx: 250.00 100 each 3   triamterene-hydrochlorothiazide (MAXZIDE-25) 37.5-25 MG tablet Take 1 tablet by mouth daily. 90 tablet 3   metFORMIN (GLUCOPHAGE) 1000 MG tablet Take 1 tablet (1,000 mg total) by mouth 2 (two) times daily with a meal. 180 tablet 3   penicillin v potassium (VEETID) 500 MG tablet TAKE ONE TABLET BY MOUTH FOUR TIMES DAILY 120 tablet 0   pioglitazone (ACTOS) 30 MG tablet Take 1  tablet by mouth once daily 90 tablet 3   potassium chloride SA (KLOR-CON M) 20 MEQ tablet Take 1 tablet by mouth once daily 90 tablet 3   No facility-administered medications prior to visit.    ROS: Review of Systems  Constitutional:  Positive for fatigue. Negative for appetite change and unexpected weight change.  HENT:  Negative for congestion, nosebleeds, sneezing, sore throat and trouble swallowing.   Eyes:  Negative for itching and visual disturbance.  Respiratory:  Negative for cough.   Cardiovascular:  Negative for chest pain, palpitations and leg swelling.  Gastrointestinal:  Negative for abdominal distention, abdominal pain, blood in stool, diarrhea, nausea and vomiting.  Genitourinary:  Negative for frequency and hematuria.  Musculoskeletal:  Positive for back pain. Negative for gait problem, joint swelling and neck pain.  Skin:  Negative for rash.  Neurological:  Negative for dizziness, tremors, speech difficulty and weakness.  Psychiatric/Behavioral:  Negative for agitation, dysphoric mood, sleep disturbance and suicidal ideas. The patient is not nervous/anxious.     Objective:  BP 126/80 (BP Location: Right Arm, Patient Position: Sitting, Cuff Size: Normal)   Pulse 80   Temp 98.6 F (37 C) (Oral)   Ht _0  (1.727 m)   Wt 208 lb (94.3 kg)   SpO2 95%   BMI 31.63 kg/m   BP Readings from Last  3 Encounters:  03/01/22 126/80  02/22/22 123/67  02/20/22 130/68    Wt Readings from Last 3 Encounters:  03/01/22 208 lb (94.3 kg)  02/21/22 208 lb (94.3 kg)  02/20/22 208 lb (94.3 kg)    Physical Exam Constitutional:      General: He is not in acute distress.    Appearance: He is well-developed. He is obese.     Comments: NAD  Eyes:     Conjunctiva/sclera: Conjunctivae normal.     Pupils: Pupils are equal, round, and reactive to light.  Neck:     Thyroid: No thyromegaly.     Vascular: No JVD.  Cardiovascular:     Rate and Rhythm: Normal rate and regular rhythm.      Heart sounds: Normal heart sounds. No murmur heard.    No friction rub. No gallop.  Pulmonary:     Effort: Pulmonary effort is normal. No respiratory distress.     Breath sounds: Normal breath sounds. No wheezing or rales.  Chest:     Chest wall: No tenderness.  Abdominal:     General: Bowel sounds are normal. There is no distension.     Palpations: Abdomen is soft. There is no mass.     Tenderness: There is no abdominal tenderness. There is no guarding or rebound.     Hernia: A hernia is present.  Musculoskeletal:        General: No tenderness. Normal range of motion.     Cervical back: Normal range of motion.  Lymphadenopathy:     Cervical: No cervical adenopathy.  Skin:    General: Skin is warm and dry.     Findings: No rash.  Neurological:     Mental Status: He is alert and oriented to person, place, and time.     Cranial Nerves: No cranial nerve deficit.     Motor: No abnormal muscle tone.     Coordination: Coordination normal.     Gait: Gait normal.     Deep Tendon Reflexes: Reflexes are normal and symmetric.  Psychiatric:        Behavior: Behavior normal.        Thought Content: Thought content normal.        Judgment: Judgment normal.     Lab Results  Component Value Date   WBC 9.9 02/21/2022   HGB 13.7 02/21/2022   HCT 40.5 02/21/2022   PLT 266 02/21/2022   GLUCOSE 189 (H) 02/21/2022   CHOL 178 03/17/2021   TRIG 255.0 (H) 03/17/2021   HDL 42.90 03/17/2021   LDLDIRECT 59.0 03/17/2021   LDLCALC 59 01/02/2014   ALT 10 02/20/2022   AST 14 02/20/2022   NA 138 02/21/2022   K 3.7 02/21/2022   CL 105 02/21/2022   CREATININE 1.42 (H) 02/21/2022   BUN 18 02/21/2022   CO2 22 02/21/2022   TSH 1.31 01/01/2018   PSA 12.24 (H) 10/31/2021   HGBA1C 6.9 (H) 02/20/2022   MICROALBUR 0.7 11/24/2016    CT Renal Stone Study  Result Date: 02/21/2022 CLINICAL DATA:  Flank pain, kidney stone suspected. EXAM: CT ABDOMEN AND PELVIS WITHOUT CONTRAST TECHNIQUE:  Multidetector CT imaging of the abdomen and pelvis was performed following the standard protocol without IV contrast. RADIATION DOSE REDUCTION: This exam was performed according to the departmental dose-optimization program which includes automated exposure control, adjustment of the mA and/or kV according to patient size and/or use of iterative reconstruction technique. COMPARISON:  None Available. FINDINGS: Lower chest: The heart is normal  in size and there is no pericardial effusion. Multi-vessel coronary artery calcifications are noted. The lung bases are clear. Hepatobiliary: No focal liver abnormality is seen. No gallstones, gallbladder wall thickening, or biliary dilatation. Pancreas: Unremarkable. No pancreatic ductal dilatation or surrounding inflammatory changes. Spleen: Normal in size without focal abnormality. Adrenals/Urinary Tract: No adrenal nodule or mass. A nonobstructive calculus is present in the mid right kidney measuring 4 mm. No ureteral calculus hydroureteronephrosis bilaterally. Multiple small stones are present in the urinary bladder bilaterally. Stomach/Bowel: Stomach is within normal limits. Appendix appears normal. No evidence of bowel wall thickening, distention, or inflammatory changes. No free air or pneumatosis. A few scattered diverticula are present along the colon without evidence of diverticulitis. Vascular/Lymphatic: Aortic atherosclerosis. No enlarged abdominal or pelvic lymph nodes. Reproductive: The prostate gland is enlarged. Other: A fat containing periumbilical hernia is noted. Fat containing inguinal hernias are present bilaterally. No abdominopelvic ascites. Musculoskeletal: Degenerative changes are present in the thoracolumbar spine. No acute osseous abnormality. IMPRESSION: 1. Nonobstructive right renal calculus. No ureteral calculus or obstructive uropathy bilaterally. 2. Multiple tiny bladder calculi bilaterally. 3. Enlarged prostate gland. 4. Coronary artery  calcifications. 5. Aortic atherosclerosis. Electronically Signed   By: Brett Fairy M.D.   On: 02/21/2022 23:57    Assessment & Plan:   Problem List Items Addressed This Visit     Elevated PSA    PSA 13 discussed w/Loy  Shanon Brow said he saw Dr Harlow Asa recently      LOW BACK PAIN    Chronic MSK/OA Norco prn  Potential benefits of a long term prn opioids use as well as potential risks (i.e. addiction risk, apnea etc) and complications (i.e. Somnolence, constipation and others) were explained to the patient and were aknowledged.      Type 2 diabetes mellitus (HCC)    Chronic, poor control Cont w/Metformin, Glipizide, Actos Monitor A1c Risks associated with treatment and diet noncompliance were discussed. Compliance was encouraged.      Relevant Medications   metFORMIN (GLUCOPHAGE) 1000 MG tablet   pioglitazone (ACTOS) 30 MG tablet   Umbilical hernia    Chronic. Gen surgery referral was offered again -  Dr Alvan Dame in [place]      Relevant Orders   Ambulatory referral to General Surgery      Meds ordered this encounter  Medications   metFORMIN (GLUCOPHAGE) 1000 MG tablet    Sig: Take 1 tablet (1,000 mg total) by mouth 2 (two) times daily with a meal.    Dispense:  180 tablet    Refill:  3   pioglitazone (ACTOS) 30 MG tablet    Sig: Take 1 tablet (30 mg total) by mouth daily.    Dispense:  90 tablet    Refill:  3   penicillin v potassium (VEETID) 500 MG tablet    Sig: TAKE ONE TABLET BY MOUTH FOUR TIMES DAILY    Dispense:  120 tablet    Refill:  0   potassium chloride SA (KLOR-CON M) 20 MEQ tablet    Sig: Take 1 tablet (20 mEq total) by mouth daily.    Dispense:  90 tablet    Refill:  3      Follow-up: No follow-ups on file.  Walker Kehr, MD

## 2022-03-01 NOTE — Assessment & Plan Note (Addendum)
Chronic. Gen surgery referral was offered again -  Dr Barnetta Chapel in [place]

## 2022-03-01 NOTE — Assessment & Plan Note (Signed)
Chronic, poor control Cont w/Metformin, Glipizide, Actos Monitor A1c Risks associated with treatment and diet noncompliance were discussed. Compliance was encouraged.

## 2022-03-01 NOTE — Assessment & Plan Note (Signed)
Chronic MSK/OA Norco prn  Potential benefits of a long term prn opioids use as well as potential risks (i.e. addiction risk, apnea etc) and complications (i.e. Somnolence, constipation and others) were explained to the patient and were aknowledged.

## 2022-03-02 ENCOUNTER — Ambulatory Visit: Payer: Medicare HMO | Admitting: Internal Medicine

## 2022-03-14 ENCOUNTER — Telehealth: Payer: Self-pay | Admitting: Internal Medicine

## 2022-03-14 NOTE — Telephone Encounter (Signed)
Caller & Relationship to patient: Timothy Schmidt  Call back number: [phone]  Date of last office visit: 03/01/22  Date of next office visit: 05/29/22  Medication(s) to be refilled:  HYDROcodone-acetaminophen (NORCO/VICODIN) 5-325 MG tablet   Preferred Pharmacy:  [id number] [place], [place] - [address] Phone:  [phone]  Fax:  [phone]

## 2022-03-14 NOTE — Telephone Encounter (Signed)
Check [place] registry last filled 02/18/2022. MD will be back 7/*31/23 will forward to his desktop for refill../l;mb

## 2022-03-20 MED ORDER — HYDROCODONE-ACETAMINOPHEN 5-325 MG PO TABS: 1.0000 | ORAL_TABLET | Freq: Two times a day (BID) | ORAL | 0 refills | Status: DC | PRN

## 2022-03-20 NOTE — Telephone Encounter (Signed)
Okay.  Thanks.

## 2022-03-26 ENCOUNTER — Other Ambulatory Visit: Payer: Self-pay | Admitting: Internal Medicine

## 2022-04-17 ENCOUNTER — Telehealth: Payer: Self-pay | Admitting: Internal Medicine

## 2022-04-17 NOTE — Telephone Encounter (Signed)
Caller & Relationship to patient: Joriel - self  Call back number: [phone]  Date of last office visit: 03-01-22  Date of next office visit: 05-29-22  Medication(s) to be refilled: HYDROcodone-acetaminophen (NORCO/VICODIN) 5-325 MG tablet  Preferred Pharmacy: [id number] - [place], [place] - [address]

## 2022-04-17 NOTE — Telephone Encounter (Signed)
Check  registry last filled 03/21/2022../lmb  

## 2022-04-18 MED ORDER — HYDROCODONE-ACETAMINOPHEN 5-325 MG PO TABS: 1.0000 | ORAL_TABLET | Freq: Two times a day (BID) | ORAL | 0 refills | Status: DC | PRN

## 2022-04-18 NOTE — Telephone Encounter (Signed)
Ok Thx 

## 2022-04-20 ENCOUNTER — Other Ambulatory Visit: Payer: Self-pay | Admitting: Internal Medicine

## 2022-05-17 ENCOUNTER — Telehealth: Payer: Self-pay | Admitting: Internal Medicine

## 2022-05-17 NOTE — Telephone Encounter (Signed)
Requesting refill of HYDROcodone-acetaminophen (NORCO/VICODIN) 5-325 MG tablet be sent to [facility] [facility] 03-01-22 NOV 05-29-22

## 2022-05-19 MED ORDER — HYDROCODONE-ACETAMINOPHEN 5-325 MG PO TABS: 1.0000 | ORAL_TABLET | Freq: Two times a day (BID) | ORAL | 0 refills | Status: DC | PRN

## 2022-05-19 NOTE — Addendum Note (Signed)
Addended by: Cassandria Anger on: 05/19/2022 10:00 AM   Modules accepted: Orders

## 2022-05-19 NOTE — Telephone Encounter (Signed)
Okay. Thank you.

## 2022-05-29 ENCOUNTER — Encounter: Payer: Self-pay | Admitting: Internal Medicine

## 2022-05-29 ENCOUNTER — Ambulatory Visit (INDEPENDENT_AMBULATORY_CARE_PROVIDER_SITE_OTHER): Payer: Medicare HMO | Admitting: Internal Medicine

## 2022-05-29 DIAGNOSIS — M5440 Lumbago with sciatica, unspecified side: Secondary | ICD-10-CM

## 2022-05-29 DIAGNOSIS — I10 Essential (primary) hypertension: Secondary | ICD-10-CM | POA: Diagnosis not present

## 2022-05-29 DIAGNOSIS — R972 Elevated prostate specific antigen [PSA]: Secondary | ICD-10-CM

## 2022-05-29 DIAGNOSIS — K029 Dental caries, unspecified: Secondary | ICD-10-CM | POA: Diagnosis not present

## 2022-05-29 DIAGNOSIS — R051 Acute cough: Secondary | ICD-10-CM

## 2022-05-29 DIAGNOSIS — E6609 Other obesity due to excess calories: Secondary | ICD-10-CM

## 2022-05-29 DIAGNOSIS — E1142 Type 2 diabetes mellitus with diabetic polyneuropathy: Secondary | ICD-10-CM

## 2022-05-29 DIAGNOSIS — G8929 Other chronic pain: Secondary | ICD-10-CM | POA: Diagnosis not present

## 2022-05-29 DIAGNOSIS — E538 Deficiency of other specified B group vitamins: Secondary | ICD-10-CM | POA: Diagnosis not present

## 2022-05-29 DIAGNOSIS — R059 Cough, unspecified: Secondary | ICD-10-CM | POA: Insufficient documentation

## 2022-05-29 DIAGNOSIS — Z6831 Body mass index (BMI) 31.0-31.9, adult: Secondary | ICD-10-CM

## 2022-05-29 LAB — COMPREHENSIVE METABOLIC PANEL
ALT: 9 U/L (ref 0–53)
AST: 10 U/L (ref 0–37)
Albumin: 4.1 g/dL (ref 3.5–5.2)
Alkaline Phosphatase: 47 U/L (ref 39–117)
BUN: 16 mg/dL (ref 6–23)
CO2: 25 mEq/L (ref 19–32)
Calcium: 10 mg/dL (ref 8.4–10.5)
Chloride: 100 mEq/L (ref 96–112)
Creatinine, Ser: 1.4 mg/dL (ref 0.40–1.50)
GFR: 52.52 mL/min — ABNORMAL LOW (ref >60.00)
Glucose, Bld: 226 mg/dL — ABNORMAL HIGH (ref 70–99)
Potassium: 4.1 mEq/L (ref 3.5–5.1)
Sodium: 138 mEq/L (ref 135–145)
Total Bilirubin: 0.2 mg/dL (ref 0.2–1.2)
Total Protein: 6.9 g/dL (ref 6.0–8.3)

## 2022-05-29 LAB — HEMOGLOBIN A1C: Hgb A1c MFr Bld: 7 % — ABNORMAL HIGH (ref 4.6–6.5)

## 2022-05-29 LAB — PSA: PSA: 15 ng/mL — ABNORMAL HIGH (ref 0.10–4.00)

## 2022-05-29 MED ORDER — PROMETHAZINE-DM 6.25-15 MG/5ML PO SYRP: 5.0000 mL | ORAL_SOLUTION | Freq: Three times a day (TID) | ORAL | 0 refills | Status: DC | PRN

## 2022-05-29 MED ORDER — PENICILLIN V POTASSIUM 500 MG PO TABS: ORAL_TABLET | ORAL | 0 refills | Status: DC

## 2022-05-29 NOTE — Assessment & Plan Note (Signed)
Post-URI Prom cough syr prn

## 2022-05-29 NOTE — Assessment & Plan Note (Signed)
Cont on Vitamin B12

## 2022-05-29 NOTE — Assessment & Plan Note (Signed)
F/u w/Dr Pushinsky

## 2022-05-29 NOTE — Assessment & Plan Note (Signed)
Cont on Triamt/HCT

## 2022-05-29 NOTE — Assessment & Plan Note (Signed)
Continue w/Norco prn  Potential benefits of a long term prn opioids use as well as potential risks (i.e. addiction risk, apnea etc) and complications (i.e. Somnolence, constipation and others) were explained to the patient and were aknowledged.

## 2022-05-29 NOTE — Assessment & Plan Note (Signed)
Extensive. He hasn't come up with money yet in 2023. He missed his dental appts (free). He needs to see a dentist - [employer] is suggested again PCN Rx given

## 2022-05-29 NOTE — Assessment & Plan Note (Signed)
Cont w/Metformin, Glipizide, Actos 

## 2022-05-29 NOTE — Progress Notes (Signed)
Subjective:  Patient ID: Timothy Schmidt, male    DOB: 1956-05-30  Age: 66 y.o. MRN: [id number]  CC: Follow-up (3 month follow up)   HPI Timothy Schmidt presents for HTN, DM, LBP C/o cough  Outpatient Medications Prior to Visit  Medication Sig Dispense Refill   Alcohol Swabs (B-D SINGLE USE SWABS BUTTERFLY) PADS Use one two times daily.Dx: 250.00 100 each 3   Ascorbic Acid (VITAMIN C WITH ROSE HIPS) 1000 MG tablet Take 1,000 mg by mouth daily.     aspirin EC 81 MG tablet Take 81 mg by mouth daily.     Blood Glucose Calibration (PRODIGY CONTROL SOLUTION) LOW SOLN Use as directed per meter instructions. 3 each 0   Blood Glucose Monitoring Suppl (PRODIGY AUTOCODE BLOOD GLUCOSE) W/DEVICE KIT Use two times daily as directed. 1 each 0   cholecalciferol (VITAMIN D) 1000 UNITS tablet Take 1 tablet (1,000 Units total) by mouth daily. 90 tablet 3   cyanocobalamin 1000 MCG tablet Take 0.5 tablets (500 mcg total) by mouth daily. 90 tablet 3   glipiZIDE (GLUCOTROL) 5 MG tablet TAKE 1 TABLET BY MOUTH TWICE DAILY BEFORE MEAL(S) 180 tablet 3   glucose blood (PRODIGY AUTOCODE TEST) test strip Use one two times daily. Dx: 250.00 100 each 3   guaiFENesin-Codeine (DIABETIC TUSSIN C PO) Take by mouth as needed.     HYDROcodone-acetaminophen (NORCO/VICODIN) 5-325 MG tablet Take 1 tablet by mouth 2 (two) times daily as needed for severe pain. 60 tablet 0   metFORMIN (GLUCOPHAGE) 1000 MG tablet Take 1 tablet (1,000 mg total) by mouth 2 (two) times daily with a meal. 180 tablet 3   Misc Natural Products ([employer] PROSTATE MAX +) TABS Take 1 tablet by mouth 2 (two) times daily.     pioglitazone (ACTOS) 30 MG tablet Take 1 tablet (30 mg total) by mouth daily. 90 tablet 3   potassium chloride SA (KLOR-CON M) 20 MEQ tablet Take 1 tablet (20 mEq total) by mouth daily. 90 tablet 3   PRODIGY TWIST TOP LANCETS 28G MISC Use one two times daily. Dx: 250.00 100 each 3   triamterene-hydrochlorothiazide (MAXZIDE-25) 37.5-25 MG  tablet Take 1 tablet by mouth once daily 90 tablet 1   penicillin v potassium (VEETID) 500 MG tablet TAKE ONE TABLET BY MOUTH FOUR TIMES DAILY 120 tablet 0   No facility-administered medications prior to visit.    ROS: Review of Systems  Constitutional:  Positive for unexpected weight change. Negative for appetite change and fatigue.  HENT:  Negative for congestion, nosebleeds, sneezing, sore throat and trouble swallowing.   Eyes:  Negative for itching and visual disturbance.  Respiratory:  Negative for cough.   Cardiovascular:  Negative for chest pain, palpitations and leg swelling.  Gastrointestinal:  Negative for abdominal distention, blood in stool, diarrhea and nausea.  Genitourinary:  Negative for frequency and hematuria.  Musculoskeletal:  Positive for back pain. Negative for gait problem, joint swelling and neck pain.  Skin:  Negative for rash.  Neurological:  Negative for dizziness, tremors, speech difficulty and weakness.  Psychiatric/Behavioral:  Negative for agitation, dysphoric mood and sleep disturbance. The patient is not nervous/anxious.     Objective:  BP 132/86 (BP Location: Left Arm, Patient Position: Sitting, Cuff Size: Large)   Pulse (!) 102   Temp 98.6 F (37 C) (Oral)   Ht _0  (1.727 m)   Wt 212 lb (96.2 kg)   SpO2 97%   BMI 32.23 kg/m   BP Readings  from Last 3 Encounters:  05/29/22 132/86  03/01/22 126/80  02/22/22 123/67    Wt Readings from Last 3 Encounters:  05/29/22 212 lb (96.2 kg)  03/01/22 208 lb (94.3 kg)  02/21/22 208 lb (94.3 kg)    Physical Exam Constitutional:      General: He is not in acute distress.    Appearance: He is well-developed. He is obese.     Comments: NAD  Eyes:     Conjunctiva/sclera: Conjunctivae normal.     Pupils: Pupils are equal, round, and reactive to light.  Neck:     Thyroid: No thyromegaly.     Vascular: No JVD.  Cardiovascular:     Rate and Rhythm: Normal rate and regular rhythm.     Heart sounds:  Normal heart sounds. No murmur heard.    No friction rub. No gallop.  Pulmonary:     Effort: Pulmonary effort is normal. No respiratory distress.     Breath sounds: Normal breath sounds. No wheezing or rales.  Chest:     Chest wall: No tenderness.  Abdominal:     General: Bowel sounds are normal. There is no distension.     Palpations: Abdomen is soft. There is no mass.     Tenderness: There is no abdominal tenderness. There is no guarding or rebound.  Musculoskeletal:        General: No tenderness. Normal range of motion.     Cervical back: Normal range of motion.  Lymphadenopathy:     Cervical: No cervical adenopathy.  Skin:    General: Skin is warm and dry.     Findings: No rash.  Neurological:     Mental Status: He is alert and oriented to person, place, and time.     Cranial Nerves: No cranial nerve deficit.     Motor: No abnormal muscle tone.     Coordination: Coordination normal.     Gait: Gait normal.     Deep Tendon Reflexes: Reflexes are normal and symmetric.  Psychiatric:        Behavior: Behavior normal.        Thought Content: Thought content normal.        Judgment: Judgment normal.    Obese caries Lab Results  Component Value Date   WBC 9.9 02/21/2022   HGB 13.7 02/21/2022   HCT 40.5 02/21/2022   PLT 266 02/21/2022   GLUCOSE 189 (H) 02/21/2022   CHOL 178 03/17/2021   TRIG 255.0 (H) 03/17/2021   HDL 42.90 03/17/2021   LDLDIRECT 59.0 03/17/2021   LDLCALC 59 01/02/2014   ALT 10 02/20/2022   AST 14 02/20/2022   NA 138 02/21/2022   K 3.7 02/21/2022   CL 105 02/21/2022   CREATININE 1.42 (H) 02/21/2022   BUN 18 02/21/2022   CO2 22 02/21/2022   TSH 1.31 01/01/2018   PSA 12.24 (H) 10/31/2021   HGBA1C 6.9 (H) 02/20/2022   MICROALBUR 0.7 11/24/2016    CT Renal Stone Study  Result Date: 02/21/2022 CLINICAL DATA:  Flank pain, kidney stone suspected. EXAM: CT ABDOMEN AND PELVIS WITHOUT CONTRAST TECHNIQUE: Multidetector CT imaging of the abdomen and pelvis  was performed following the standard protocol without IV contrast. RADIATION DOSE REDUCTION: This exam was performed according to the departmental dose-optimization program which includes automated exposure control, adjustment of the mA and/or kV according to patient size and/or use of iterative reconstruction technique. COMPARISON:  None Available. FINDINGS: Lower chest: The heart is normal in size and there is no  pericardial effusion. Multi-vessel coronary artery calcifications are noted. The lung bases are clear. Hepatobiliary: No focal liver abnormality is seen. No gallstones, gallbladder wall thickening, or biliary dilatation. Pancreas: Unremarkable. No pancreatic ductal dilatation or surrounding inflammatory changes. Spleen: Normal in size without focal abnormality. Adrenals/Urinary Tract: No adrenal nodule or mass. A nonobstructive calculus is present in the mid right kidney measuring 4 mm. No ureteral calculus hydroureteronephrosis bilaterally. Multiple small stones are present in the urinary bladder bilaterally. Stomach/Bowel: Stomach is within normal limits. Appendix appears normal. No evidence of bowel wall thickening, distention, or inflammatory changes. No free air or pneumatosis. A few scattered diverticula are present along the colon without evidence of diverticulitis. Vascular/Lymphatic: Aortic atherosclerosis. No enlarged abdominal or pelvic lymph nodes. Reproductive: The prostate gland is enlarged. Other: A fat containing periumbilical hernia is noted. Fat containing inguinal hernias are present bilaterally. No abdominopelvic ascites. Musculoskeletal: Degenerative changes are present in the thoracolumbar spine. No acute osseous abnormality. IMPRESSION: 1. Nonobstructive right renal calculus. No ureteral calculus or obstructive uropathy bilaterally. 2. Multiple tiny bladder calculi bilaterally. 3. Enlarged prostate gland. 4. Coronary artery calcifications. 5. Aortic atherosclerosis. Electronically  Signed   By: Brett Fairy M.D.   On: 02/21/2022 23:57    Assessment & Plan:   Problem List Items Addressed This Visit     B12 deficiency    Cont on Vitamin B12      Caries    Extensive. He hasn't come up with money yet in 2023. He missed his dental appts (free). He needs to see a dentist - Dental School is suggested again PCN Rx given      Cough    Post-URI Prom cough syr prn      Elevated PSA    F/u w/Dr Pushinsky      Relevant Orders   PSA   Essential hypertension    Cont on Triamt/HCT      LOW BACK PAIN    Continue w/Norco prn  Potential benefits of a long term prn opioids use as well as potential risks (i.e. addiction risk, apnea etc) and complications (i.e. Somnolence, constipation and others) were explained to the patient and were aknowledged.      Obesity    Worse a little Timothy Schmidt is trying to do better w/his diet      Type 2 diabetes mellitus (HCC)    Cont w/Metformin, Glipizide, Actos      Relevant Orders   Comprehensive metabolic panel   Hemoglobin A1c      Meds ordered this encounter  Medications   penicillin v potassium (VEETID) 500 MG tablet    Sig: TAKE ONE TABLET BY MOUTH FOUR TIMES DAILY    Dispense:  120 tablet    Refill:  0   promethazine-dextromethorphan (PROMETHAZINE-DM) 6.25-15 MG/5ML syrup    Sig: Take 5 mLs by mouth 3 (three) times daily as needed for cough.    Dispense:  240 mL    Refill:  0      Follow-up: No follow-ups on file.  Walker Kehr, MD

## 2022-05-29 NOTE — Assessment & Plan Note (Signed)
Timothy Schmidt is trying to do better w/his diet

## 2022-06-01 ENCOUNTER — Ambulatory Visit: Payer: Medicare HMO | Admitting: Internal Medicine

## 2022-06-06 ENCOUNTER — Telehealth: Payer: Self-pay

## 2022-06-06 NOTE — Telephone Encounter (Signed)
Called pt no answer LMOM RTC.../lmb 

## 2022-06-06 NOTE — Telephone Encounter (Signed)
Patient is calling in stating that he would like a call back, would not explain what it was about.

## 2022-06-07 NOTE — Telephone Encounter (Signed)
Called pt again still no answer LMOM RTC. Per chart pt has made appt for 06/12/22.Marland KitchenJohny Schmidt

## 2022-06-12 ENCOUNTER — Encounter: Payer: Self-pay | Admitting: Internal Medicine

## 2022-06-12 ENCOUNTER — Ambulatory Visit (INDEPENDENT_AMBULATORY_CARE_PROVIDER_SITE_OTHER): Payer: Medicare HMO | Admitting: Internal Medicine

## 2022-06-12 VITALS — BP 110/62 | HR 111 | Temp 98.9°F | Ht 68.0 in | Wt 211.4 lb

## 2022-06-12 DIAGNOSIS — R972 Elevated prostate specific antigen [PSA]: Secondary | ICD-10-CM | POA: Diagnosis not present

## 2022-06-12 DIAGNOSIS — G8929 Other chronic pain: Secondary | ICD-10-CM

## 2022-06-12 DIAGNOSIS — K029 Dental caries, unspecified: Secondary | ICD-10-CM | POA: Diagnosis not present

## 2022-06-12 DIAGNOSIS — M5440 Lumbago with sciatica, unspecified side: Secondary | ICD-10-CM | POA: Diagnosis not present

## 2022-06-12 DIAGNOSIS — E1142 Type 2 diabetes mellitus with diabetic polyneuropathy: Secondary | ICD-10-CM | POA: Diagnosis not present

## 2022-06-12 DIAGNOSIS — F4321 Adjustment disorder with depressed mood: Secondary | ICD-10-CM | POA: Insufficient documentation

## 2022-06-12 DIAGNOSIS — E538 Deficiency of other specified B group vitamins: Secondary | ICD-10-CM | POA: Diagnosis not present

## 2022-06-12 NOTE — Assessment & Plan Note (Addendum)
On Vit B12 

## 2022-06-12 NOTE — Progress Notes (Signed)
Subjective:  Patient ID: Timothy Schmidt, male    DOB: Jan 01, 1956  Age: 66 y.o. MRN: [id number]  CC: Advice Only ([place])   HPI Timothy Schmidt presents for c/o depression - he is being evicted from his house. He needs to get a different legal guardian. His previous legal guardian resigned. Timothy Schmidt is having legal issues. Timothy Schmidt has a Chiropractor.  F/u elev PSA, LBP  Outpatient Medications Prior to Visit  Medication Sig Dispense Refill   Alcohol Swabs (B-D SINGLE USE SWABS BUTTERFLY) PADS Use one two times daily.Dx: 250.00 100 each 3   Ascorbic Acid (VITAMIN C WITH ROSE HIPS) 1000 MG tablet Take 1,000 mg by mouth daily.     aspirin EC 81 MG tablet Take 81 mg by mouth daily.     Blood Glucose Calibration (PRODIGY CONTROL SOLUTION) LOW SOLN Use as directed per meter instructions. 3 each 0   Blood Glucose Monitoring Suppl (PRODIGY AUTOCODE BLOOD GLUCOSE) W/DEVICE KIT Use two times daily as directed. 1 each 0   cholecalciferol (VITAMIN D) 1000 UNITS tablet Take 1 tablet (1,000 Units total) by mouth daily. 90 tablet 3   cyanocobalamin 1000 MCG tablet Take 0.5 tablets (500 mcg total) by mouth daily. 90 tablet 3   glipiZIDE (GLUCOTROL) 5 MG tablet TAKE 1 TABLET BY MOUTH TWICE DAILY BEFORE MEAL(S) 180 tablet 3   glucose blood (PRODIGY AUTOCODE TEST) test strip Use one two times daily. Dx: 250.00 100 each 3   guaiFENesin-Codeine (DIABETIC TUSSIN C PO) Take by mouth as needed.     HYDROcodone-acetaminophen (NORCO/VICODIN) 5-325 MG tablet Take 1 tablet by mouth 2 (two) times daily as needed for severe pain. 60 tablet 0   metFORMIN (GLUCOPHAGE) 1000 MG tablet Take 1 tablet (1,000 mg total) by mouth 2 (two) times daily with a meal. 180 tablet 3   Misc Natural Products ([employer]S PROSTATE MAX +) TABS Take 1 tablet by mouth 2 (two) times daily.     penicillin v potassium (VEETID) 500 MG tablet TAKE ONE TABLET BY MOUTH FOUR TIMES DAILY 120 tablet 0   pioglitazone (ACTOS) 30 MG tablet Take 1  tablet (30 mg total) by mouth daily. 90 tablet 3   potassium chloride SA (KLOR-CON M) 20 MEQ tablet Take 1 tablet (20 mEq total) by mouth daily. 90 tablet 3   PRODIGY TWIST TOP LANCETS 28G MISC Use one two times daily. Dx: 250.00 100 each 3   promethazine-dextromethorphan (PROMETHAZINE-DM) 6.25-15 MG/5ML syrup Take 5 mLs by mouth 3 (three) times daily as needed for cough. 240 mL 0   triamterene-hydrochlorothiazide (MAXZIDE-25) 37.5-25 MG tablet Take 1 tablet by mouth once daily 90 tablet 1   No facility-administered medications prior to visit.    ROS: Review of Systems  Constitutional:  Positive for fatigue. Negative for appetite change and unexpected weight change.  HENT:  Negative for congestion, nosebleeds, sneezing, sore throat and trouble swallowing.   Eyes:  Negative for itching and visual disturbance.  Respiratory:  Negative for cough.   Cardiovascular:  Negative for chest pain, palpitations and leg swelling.  Gastrointestinal:  Negative for abdominal distention, blood in stool, diarrhea and nausea.  Genitourinary:  Negative for frequency and hematuria.  Musculoskeletal:  Positive for back pain. Negative for gait problem, joint swelling and neck pain.  Skin:  Negative for rash.  Neurological:  Negative for dizziness, tremors, speech difficulty and weakness.  Psychiatric/Behavioral:  Positive for decreased concentration, dysphoric mood and sleep disturbance. Negative for agitation and suicidal  ideas. The patient is nervous/anxious.     Objective:  BP 110/62 (BP Location: Left Arm)   Pulse (!) 111   Temp 98.9 F (37.2 C) (Oral)   Ht 5' 8" (1.727 m)   Wt 211 lb 6.4 oz (95.9 kg)   SpO2 95%   BMI 32.14 kg/m   BP Readings from Last 3 Encounters:  06/12/22 110/62  05/29/22 132/86  03/01/22 126/80    Wt Readings from Last 3 Encounters:  06/12/22 211 lb 6.4 oz (95.9 kg)  05/29/22 212 lb (96.2 kg)  03/01/22 208 lb (94.3 kg)    Physical Exam Constitutional:      General:  He is not in acute distress.    Appearance: He is well-developed. He is obese.     Comments: NAD  Eyes:     Conjunctiva/sclera: Conjunctivae normal.     Pupils: Pupils are equal, round, and reactive to light.  Neck:     Thyroid: No thyromegaly.     Vascular: No JVD.  Cardiovascular:     Rate and Rhythm: Normal rate and regular rhythm.     Heart sounds: Normal heart sounds. No murmur heard.    No friction rub. No gallop.  Pulmonary:     Effort: Pulmonary effort is normal. No respiratory distress.     Breath sounds: Normal breath sounds. No wheezing or rales.  Chest:     Chest wall: No tenderness.  Abdominal:     General: Bowel sounds are normal. There is no distension.     Palpations: Abdomen is soft. There is no mass.     Tenderness: There is no abdominal tenderness. There is no guarding or rebound.  Musculoskeletal:        General: Tenderness present. Normal range of motion.     Cervical back: Normal range of motion.  Lymphadenopathy:     Cervical: No cervical adenopathy.  Skin:    General: Skin is warm and dry.     Findings: No rash.  Neurological:     Mental Status: He is alert and oriented to person, place, and time.     Cranial Nerves: No cranial nerve deficit.     Motor: No abnormal muscle tone.     Coordination: Coordination normal.     Gait: Gait normal.     Deep Tendon Reflexes: Reflexes are normal and symmetric.  Psychiatric:        Behavior: Behavior normal.        Thought Content: Thought content normal.   Timothy Schmidt is stiff Not suicidal or homicidal Talkative  Lab Results  Component Value Date   WBC 9.9 02/21/2022   HGB 13.7 02/21/2022   HCT 40.5 02/21/2022   PLT 266 02/21/2022   GLUCOSE 226 (H) 05/29/2022   CHOL 178 03/17/2021   TRIG 255.0 (H) 03/17/2021   HDL 42.90 03/17/2021   LDLDIRECT 59.0 03/17/2021   LDLCALC 59 01/02/2014   ALT 9 05/29/2022   AST 10 05/29/2022   NA 138 05/29/2022   K 4.1 05/29/2022   CL 100 05/29/2022   CREATININE 1.40  05/29/2022   BUN 16 05/29/2022   CO2 25 05/29/2022   TSH 1.31 01/01/2018   PSA 15.00 (H) 05/29/2022   HGBA1C 7.0 (H) 05/29/2022   MICROALBUR 0.7 11/24/2016    CT Renal Stone Study  Result Date: 02/21/2022 CLINICAL DATA:  Flank pain, kidney stone suspected. EXAM: CT ABDOMEN AND PELVIS WITHOUT CONTRAST TECHNIQUE: Multidetector CT imaging of the abdomen and pelvis was performed following the  standard protocol without IV contrast. RADIATION DOSE REDUCTION: This exam was performed according to the departmental dose-optimization program which includes automated exposure control, adjustment of the mA and/or kV according to patient size and/or use of iterative reconstruction technique. COMPARISON:  None Available. FINDINGS: Lower chest: The heart is normal in size and there is no pericardial effusion. Multi-vessel coronary artery calcifications are noted. The lung bases are clear. Hepatobiliary: No focal liver abnormality is seen. No gallstones, gallbladder wall thickening, or biliary dilatation. Pancreas: Unremarkable. No pancreatic ductal dilatation or surrounding inflammatory changes. Spleen: Normal in size without focal abnormality. Adrenals/Urinary Tract: No adrenal nodule or mass. A nonobstructive calculus is present in the mid right kidney measuring 4 mm. No ureteral calculus hydroureteronephrosis bilaterally. Multiple small stones are present in the urinary bladder bilaterally. Stomach/Bowel: Stomach is within normal limits. Appendix appears normal. No evidence of bowel wall thickening, distention, or inflammatory changes. No free air or pneumatosis. A few scattered diverticula are present along the colon without evidence of diverticulitis. Vascular/Lymphatic: Aortic atherosclerosis. No enlarged abdominal or pelvic lymph nodes. Reproductive: The prostate gland is enlarged. Other: A fat containing periumbilical hernia is noted. Fat containing inguinal hernias are present bilaterally. No abdominopelvic  ascites. Musculoskeletal: Degenerative changes are present in the thoracolumbar spine. No acute osseous abnormality. IMPRESSION: 1. Nonobstructive right renal calculus. No ureteral calculus or obstructive uropathy bilaterally. 2. Multiple tiny bladder calculi bilaterally. 3. Enlarged prostate gland. 4. Coronary artery calcifications. 5. Aortic atherosclerosis. Electronically Signed   By: Brett Fairy M.D.   On: 02/21/2022 23:57    Assessment & Plan:   Problem List Items Addressed This Visit     B12 deficiency    On Vit B12      Caries    Still lacking funds to fix dental problem.  Extensive. He hasn't come up with money yet in 2023. He missed his dental appts (free). He needs to see a dentist - [employer] is suggested again PCN Rx       Elevated PSA    Timothy Schmidt is planning to see a urologist - he thought he rescheduled with another doctor. I don't see any appointments. He wants to go to [place]. Will ref to Dr Rosana Hoes       Relevant Orders   Ambulatory referral to Urology   LOW BACK PAIN    Chronic MSK/OA Continue w/Norco prn  Potential benefits of a long term prn opioids use as well as potential risks (i.e. addiction risk, apnea etc) and complications (i.e. Somnolence, constipation and others) were explained to the patient and were aknowledged.      Situational depression - Primary    New reactve depression - he is being evicted from his house. He needs to get a different legal guardian. His previous legal guardian resigned. Timothy Schmidt is having legal issues. Timothy Schmidt has a Chiropractor. He needs to see a psychiatrist.      Relevant Orders   Ambulatory referral to Psychiatry   Type 2 diabetes mellitus ([place])    Cont w/Metformin, Glipizide, Actos         No orders of the defined types were placed in this encounter.     Follow-up: Return in about 2 months (around 08/12/2022) for a follow-up visit.  Walker Kehr, MD

## 2022-06-12 NOTE — Assessment & Plan Note (Signed)
Still lacking funds to fix dental problem.  Extensive. He hasn't come up with money yet in 2023. He missed his dental appts (free). He needs to see a dentist - [facility] School is suggested again PCN Rx

## 2022-06-12 NOTE — Assessment & Plan Note (Signed)
Chronic MSK/OA Continue w/Norco prn  Potential benefits of a long term prn opioids use as well as potential risks (i.e. addiction risk, apnea etc) and complications (i.e. Somnolence, constipation and others) were explained to the patient and were aknowledged.

## 2022-06-12 NOTE — Assessment & Plan Note (Addendum)
Timothy Schmidt is planning to see a urologist - he thought he rescheduled with another doctor. I don't see any appointments. He wants to go to [place]. Will ref to Dr Rosana Hoes

## 2022-06-12 NOTE — Assessment & Plan Note (Signed)
New reactve depression - he is being evicted from his house. He needs to get a different legal guardian. His previous legal guardian resigned. Timothy Schmidt is having legal issues. Timothy Schmidt has a Chiropractor. He needs to see a psychiatrist.

## 2022-06-12 NOTE — Assessment & Plan Note (Signed)
Cont w/Metformin, Glipizide, Actos 

## 2022-06-19 ENCOUNTER — Telehealth: Payer: Self-pay | Admitting: Internal Medicine

## 2022-06-19 NOTE — Telephone Encounter (Signed)
Would like to discuss the referrals that Dr. Alain Marion has made for him.

## 2022-06-19 NOTE — Telephone Encounter (Signed)
Caller & Relationship to patient: Self  Call back number: [phone]  Date of last office visit: 10.23.23  Date of next office visit: 12.29.23  Medication(s) to be refilled:  HYDROcodone-acetaminophen (NORCO/VICODIN) 5-325 MG tablet    Preferred Pharmacy:   [facility] [phone]  Phone:  [phone]  Fax:  [phone]

## 2022-06-20 NOTE — Telephone Encounter (Signed)
Called pt no answer LMOM RTC,,,/lmb 

## 2022-06-21 ENCOUNTER — Telehealth: Payer: Self-pay | Admitting: Internal Medicine

## 2022-06-21 MED ORDER — HYDROCODONE-ACETAMINOPHEN 5-325 MG PO TABS: 1.0000 | ORAL_TABLET | Freq: Two times a day (BID) | ORAL | 0 refills | Status: DC | PRN

## 2022-06-21 NOTE — Telephone Encounter (Signed)
See previous msg../lmb 

## 2022-06-21 NOTE — Telephone Encounter (Signed)
Patient called regarding referral to psychiatry:  Patient researched a psychaitrist for medication mgmt. He was given the name and phone number for psychiatry services at:   [facility] [place] [address] [phone]   Pt has a telephone appt with them to complete an application on today, 56/8/12 at 11:00 am

## 2022-06-21 NOTE — Telephone Encounter (Signed)
Hydrocodone did not go through.. Need to resend.Marland KitchenJohny Chess

## 2022-06-21 NOTE — Telephone Encounter (Signed)
Okay.  Thanks.

## 2022-06-21 NOTE — Telephone Encounter (Signed)
FYI

## 2022-06-22 MED ORDER — HYDROCODONE-ACETAMINOPHEN 5-325 MG PO TABS: 1.0000 | ORAL_TABLET | Freq: Two times a day (BID) | ORAL | 0 refills | Status: DC | PRN

## 2022-06-22 NOTE — Telephone Encounter (Signed)
No.. you already referred him. Msg was just a FYI he had a telephone visit 06/21/22.Marland KitchenJohny Chess

## 2022-06-22 NOTE — Telephone Encounter (Signed)
OK. Thx

## 2022-06-22 NOTE — Telephone Encounter (Signed)
Do we need to send the referral there?  Thanks

## 2022-06-27 NOTE — Patient Instructions (Signed)
Health Maintenance, Male Adopting a healthy lifestyle and getting preventive care are important in promoting health and wellness. Ask your health care provider about: The right schedule for you to have regular tests and exams. Things you can do on your own to prevent diseases and keep yourself healthy. What should I know about diet, weight, and exercise? Eat a healthy diet  Eat a diet that includes plenty of vegetables, fruits, low-fat dairy products, and lean protein. Do not eat a lot of foods that are high in solid fats, added sugars, or sodium. Maintain a healthy weight Body mass index (BMI) is a measurement that can be used to identify possible weight problems. It estimates body fat based on height and weight. Your health care provider can help determine your BMI and help you achieve or maintain a healthy weight. Get regular exercise Get regular exercise. This is one of the most important things you can do for your health. Most adults should: Exercise for at least 150 minutes each week. The exercise should increase your heart rate and make you sweat (moderate-intensity exercise). Do strengthening exercises at least twice a week. This is in addition to the moderate-intensity exercise. Spend less time sitting. Even light physical activity can be beneficial. Watch cholesterol and blood lipids Have your blood tested for lipids and cholesterol at 66 years of age, then have this test every 5 years. You may need to have your cholesterol levels checked more often if: Your lipid or cholesterol levels are high. You are older than 66 years of age. You are at high risk for heart disease. What should I know about cancer screening? Many types of cancers can be detected early and may often be prevented. Depending on your health history and family history, you may need to have cancer screening at various ages. This may include screening for: Colorectal cancer. Prostate cancer. Skin cancer. Lung  cancer. What should I know about heart disease, diabetes, and high blood pressure? Blood pressure and heart disease High blood pressure causes heart disease and increases the risk of stroke. This is more likely to develop in people who have high blood pressure readings or are overweight. Talk with your health care provider about your target blood pressure readings. Have your blood pressure checked: Every 3-5 years if you are 18-39 years of age. Every year if you are 40 years old or older. If you are between the ages of 65 and 75 and are a current or former smoker, ask your health care provider if you should have a one-time screening for abdominal aortic aneurysm (AAA). Diabetes Have regular diabetes screenings. This checks your fasting blood sugar level. Have the screening done: Once every three years after age 45 if you are at a normal weight and have a low risk for diabetes. More often and at a younger age if you are overweight or have a high risk for diabetes. What should I know about preventing infection? Hepatitis B If you have a higher risk for hepatitis B, you should be screened for this virus. Talk with your health care provider to find out if you are at risk for hepatitis B infection. Hepatitis C Blood testing is recommended for: Everyone born from 1945 through 1965. Anyone with known risk factors for hepatitis C. Sexually transmitted infections (STIs) You should be screened each year for STIs, including gonorrhea and chlamydia, if: You are sexually active and are younger than 66 years of age. You are older than 66 years of age and your   health care provider tells you that you are at risk for this type of infection. Your sexual activity has changed since you were last screened, and you are at increased risk for chlamydia or gonorrhea. Ask your health care provider if you are at risk. Ask your health care provider about whether you are at high risk for HIV. Your health care provider  may recommend a prescription medicine to help prevent HIV infection. If you choose to take medicine to prevent HIV, you should first get tested for HIV. You should then be tested every 3 months for as long as you are taking the medicine. Follow these instructions at home: Alcohol use Do not drink alcohol if your health care provider tells you not to drink. If you drink alcohol: Limit how much you have to 0-2 drinks a day. Know how much alcohol is in your drink. In the [place] one drink equals one 12 oz bottle of beer (355 mL), one 5 oz glass of Gianny Killman (148 mL), or one 1 oz glass of hard liquor (44 mL). Lifestyle Do not use any products that contain nicotine or tobacco. These products include cigarettes, chewing tobacco, and vaping devices, such as e-cigarettes. If you need help quitting, ask your health care provider. Do not use street drugs. Do not share needles. Ask your health care provider for help if you need support or information about quitting drugs. General instructions Schedule regular health, dental, and eye exams. Stay current with your vaccines. Tell your health care provider if: You often feel depressed. You have ever been abused or do not feel safe at home. Summary Adopting a healthy lifestyle and getting preventive care are important in promoting health and wellness. Follow your health care provider's instructions about healthy diet, exercising, and getting tested or screened for diseases. Follow your health care provider's instructions on monitoring your cholesterol and blood pressure. This information is not intended to replace advice given to you by your health care provider. Make sure you discuss any questions you have with your health care provider. Document Revised: 12/27/2020 Document Reviewed: 12/27/2020 Elsevier Patient Education  2023 [employer]vier Inc.  

## 2022-06-27 NOTE — Progress Notes (Addendum)
Erroneous encounter

## 2022-06-28 ENCOUNTER — Ambulatory Visit: Payer: Medicare HMO | Admitting: *Deleted

## 2022-06-28 DIAGNOSIS — Z0000 Encounter for general adult medical examination without abnormal findings: Secondary | ICD-10-CM

## 2022-07-17 ENCOUNTER — Telehealth: Payer: Self-pay | Admitting: Internal Medicine

## 2022-07-17 NOTE — Telephone Encounter (Signed)
Patient called asking for a refill on his Hydrocodone.

## 2022-07-18 MED ORDER — HYDROCODONE-ACETAMINOPHEN 5-325 MG PO TABS: 1.0000 | ORAL_TABLET | Freq: Two times a day (BID) | ORAL | 0 refills | Status: DC | PRN

## 2022-07-18 NOTE — Telephone Encounter (Signed)
OK. Thx

## 2022-08-03 DIAGNOSIS — F332 Major depressive disorder, recurrent severe without psychotic features: Secondary | ICD-10-CM | POA: Diagnosis not present

## 2022-08-03 DIAGNOSIS — F411 Generalized anxiety disorder: Secondary | ICD-10-CM | POA: Diagnosis not present

## 2022-08-16 ENCOUNTER — Encounter: Payer: Self-pay | Admitting: Pharmacist

## 2022-08-16 NOTE — Progress Notes (Addendum)
[facility]                                            [place] Team                                        Statin Quality Measure Assessment    08/16/2022  Timothy Schmidt 1956-08-16 [id number]  I am a [facility] clinical pharmacist that reviews patients for statin quality initiatives.     Per review of chart and payor information, [name] has a diagnosis of diabetes but is not currently filling a statin prescription.  This places him into the SUPD (Statin Use In Patients with Diabetes) measure for CMS.    I could not find any documentation of previous trial of a statin or a history of statin intolerance. If clinically appropriate, please consider evaluating [name] for statin therapy at his office visit on April 1st.  His calculated 10-year ASCVD risk score (Arnett DK, et al., 2019) is: 36.6%.  Please consider ONE of the following recommendations:  Initiate high intensity statin Atorvastatin 40mg  once daily, #90, 3 refills   Rosuvastatin 20mg  once daily, #90, 3 refills    Initiate moderate intensity          statin with reduced frequency if prior          statin intolerance 1x weekly, #13, 3 refills   2x weekly, #26, 3 refills   3x weekly, #39, 3 refills    Code for past statin intolerance or  other exclusions (required annually)  Provider Requirements: Associate code during an office visit or telehealth encounter  Drug Induced Myopathy G72.0   Myopathy, unspecified G72.9   Myositis, unspecified M60.9   Rhabdomyolysis XX123456   Alcoholic fatty liver 99991111   Cirrhosis of liver K74.69   Prediabetes R73.03   Thank you for your time and consideration, Curlene Labrum, PharmD [place] Pharmacist Office: [phone]

## 2022-08-17 NOTE — Telephone Encounter (Signed)
Check [place] [facility] last filled 07/19/2022. Pharmacy was not suppose to filled before 07/21/22...[name]

## 2022-08-17 NOTE — Telephone Encounter (Signed)
Patient called and stated he needed a refill on his Hydrocodone.

## 2022-08-18 ENCOUNTER — Encounter: Payer: Self-pay | Admitting: Internal Medicine

## 2022-08-18 ENCOUNTER — Ambulatory Visit (INDEPENDENT_AMBULATORY_CARE_PROVIDER_SITE_OTHER): Payer: Medicare HMO | Admitting: Internal Medicine

## 2022-08-18 VITALS — BP 124/72 | HR 90 | Temp 99.1°F | Ht 68.0 in | Wt 207.0 lb

## 2022-08-18 DIAGNOSIS — G8929 Other chronic pain: Secondary | ICD-10-CM | POA: Diagnosis not present

## 2022-08-18 DIAGNOSIS — E538 Deficiency of other specified B group vitamins: Secondary | ICD-10-CM | POA: Diagnosis not present

## 2022-08-18 DIAGNOSIS — F4323 Adjustment disorder with mixed anxiety and depressed mood: Secondary | ICD-10-CM | POA: Diagnosis not present

## 2022-08-18 DIAGNOSIS — E1142 Type 2 diabetes mellitus with diabetic polyneuropathy: Secondary | ICD-10-CM | POA: Diagnosis not present

## 2022-08-18 DIAGNOSIS — R972 Elevated prostate specific antigen [PSA]: Secondary | ICD-10-CM

## 2022-08-18 DIAGNOSIS — K029 Dental caries, unspecified: Secondary | ICD-10-CM | POA: Diagnosis not present

## 2022-08-18 DIAGNOSIS — M5440 Lumbago with sciatica, unspecified side: Secondary | ICD-10-CM

## 2022-08-18 MED ORDER — HYDROCODONE-ACETAMINOPHEN 5-325 MG PO TABS: 1.0000 | ORAL_TABLET | Freq: Two times a day (BID) | ORAL | 0 refills | Status: DC | PRN

## 2022-08-18 MED ORDER — PENICILLIN V POTASSIUM 500 MG PO TABS: ORAL_TABLET | ORAL | 0 refills | Status: DC

## 2022-08-18 NOTE — Assessment & Plan Note (Signed)
Cont w/Metformin, Glipizide, Actos 

## 2022-08-18 NOTE — Addendum Note (Signed)
Addended by: Tresa Garter on: 08/18/2022 07:53 AM   Modules accepted: Orders

## 2022-08-18 NOTE — Assessment & Plan Note (Signed)
Stable stress 

## 2022-08-18 NOTE — Assessment & Plan Note (Signed)
Chronic. Cont on Vitamin B12

## 2022-08-18 NOTE — Assessment & Plan Note (Signed)
Timothy Schmidt missed his dental appt Risks associated with treatment noncompliance were discussed. Compliance was encouraged.

## 2022-08-18 NOTE — Progress Notes (Signed)
Subjective:  Patient ID: Timothy Schmidt, male    DOB: 09-07-1955  Age: 66 y.o. MRN: [id number]  CC: Follow-up   HPI Timothy Schmidt presents for DM, HTN, LBP, elevated PSA  Outpatient Medications Prior to Visit  Medication Sig Dispense Refill   Alcohol Swabs (B-D SINGLE USE SWABS BUTTERFLY) PADS Use one two times daily.Dx: 250.00 100 each 3   Ascorbic Acid (VITAMIN C WITH ROSE HIPS) 1000 MG tablet Take 1,000 mg by mouth daily.     aspirin EC 81 MG tablet Take 81 mg by mouth daily.     Blood Glucose Calibration (PRODIGY CONTROL SOLUTION) LOW SOLN Use as directed per meter instructions. 3 each 0   Blood Glucose Monitoring Suppl (PRODIGY AUTOCODE BLOOD GLUCOSE) W/DEVICE KIT Use two times daily as directed. 1 each 0   cholecalciferol (VITAMIN D) 1000 UNITS tablet Take 1 tablet (1,000 Units total) by mouth daily. 90 tablet 3   cyanocobalamin 1000 MCG tablet Take 0.5 tablets (500 mcg total) by mouth daily. 90 tablet 3   glipiZIDE (GLUCOTROL) 5 MG tablet TAKE 1 TABLET BY MOUTH TWICE DAILY BEFORE MEAL(S) 180 tablet 3   glucose blood (PRODIGY AUTOCODE TEST) test strip Use one two times daily. Dx: 250.00 100 each 3   guaiFENesin-Codeine (DIABETIC TUSSIN C PO) Take by mouth as needed.     HYDROcodone-acetaminophen (NORCO/VICODIN) 5-325 MG tablet Take 1 tablet by mouth 2 (two) times daily as needed for severe pain. 60 tablet 0   metFORMIN (GLUCOPHAGE) 1000 MG tablet Take 1 tablet (1,000 mg total) by mouth 2 (two) times daily with a meal. 180 tablet 3   Misc Natural Products ([employer] PROSTATE MAX +) TABS Take 1 tablet by mouth 2 (two) times daily.     penicillin v potassium (VEETID) 500 MG tablet TAKE ONE TABLET BY MOUTH FOUR TIMES DAILY 120 tablet 0   pioglitazone (ACTOS) 30 MG tablet Take 1 tablet (30 mg total) by mouth daily. 90 tablet 3   potassium chloride SA (KLOR-CON M) 20 MEQ tablet Take 1 tablet (20 mEq total) by mouth daily. 90 tablet 3   PRODIGY TWIST TOP LANCETS 28G MISC Use one two times  daily. Dx: 250.00 100 each 3   promethazine-dextromethorphan (PROMETHAZINE-DM) 6.25-15 MG/5ML syrup Take 5 mLs by mouth 3 (three) times daily as needed for cough. 240 mL 0   triamterene-hydrochlorothiazide (MAXZIDE-25) 37.5-25 MG tablet Take 1 tablet by mouth once daily 90 tablet 1   No facility-administered medications prior to visit.    ROS: Review of Systems  Constitutional:  Negative for appetite change, fatigue and unexpected weight change.  HENT:  Negative for congestion, nosebleeds, sneezing, sore throat and trouble swallowing.   Eyes:  Negative for itching and visual disturbance.  Respiratory:  Negative for cough.   Cardiovascular:  Negative for chest pain, palpitations and leg swelling.  Gastrointestinal:  Negative for abdominal distention, blood in stool, diarrhea and nausea.  Genitourinary:  Negative for frequency and hematuria.  Musculoskeletal:  Positive for back pain. Negative for gait problem, joint swelling and neck pain.  Skin:  Negative for rash.  Neurological:  Negative for dizziness, tremors, speech difficulty and weakness.  Psychiatric/Behavioral:  Negative for agitation, dysphoric mood and sleep disturbance. The patient is not nervous/anxious.     Objective:  BP 124/72 (BP Location: Right Arm, Patient Position: Sitting, Cuff Size: Normal)   Pulse 90   Temp 99.1 F (37.3 C) (Oral)   Ht _0  (1.727 m)   Wt 207  lb (93.9 kg)   SpO2 97%   BMI 31.47 kg/m   BP Readings from Last 3 Encounters:  08/18/22 124/72  06/12/22 110/62  05/29/22 132/86    Wt Readings from Last 3 Encounters:  08/18/22 207 lb (93.9 kg)  06/12/22 211 lb 6.4 oz (95.9 kg)  05/29/22 212 lb (96.2 kg)    Physical Exam Constitutional:      General: He is not in acute distress.    Appearance: He is well-developed. He is obese. He is not diaphoretic.     Comments: NAD  HENT:     Head: Normocephalic and atraumatic.     Right Ear: External ear normal.     Left Ear: External ear normal.      Nose: Nose normal.     Mouth/Throat:     Pharynx: No oropharyngeal exudate.  Eyes:     General: No scleral icterus.       Right eye: No discharge.        Left eye: No discharge.     Conjunctiva/sclera: Conjunctivae normal.     Pupils: Pupils are equal, round, and reactive to light.  Neck:     Thyroid: No thyromegaly.     Vascular: No JVD.     Trachea: No tracheal deviation.  Cardiovascular:     Rate and Rhythm: Normal rate and regular rhythm.     Heart sounds: Normal heart sounds. No murmur heard.    No friction rub. No gallop.  Pulmonary:     Effort: Pulmonary effort is normal. No respiratory distress.     Breath sounds: Normal breath sounds. No stridor. No wheezing or rales.  Chest:     Chest wall: No tenderness.  Abdominal:     General: Bowel sounds are normal. There is no distension.     Palpations: Abdomen is soft. There is no mass.     Tenderness: There is no abdominal tenderness. There is no guarding or rebound.  Genitourinary:    Penis: Normal. No tenderness.      Prostate: Normal.     Rectum: Normal. Guaiac result negative.  Musculoskeletal:        General: No tenderness. Normal range of motion.     Cervical back: Normal range of motion and neck supple.  Lymphadenopathy:     Cervical: No cervical adenopathy.  Skin:    General: Skin is warm and dry.     Coloration: Skin is not pale.     Findings: No erythema or rash.  Neurological:     Mental Status: He is alert and oriented to person, place, and time.     Cranial Nerves: No cranial nerve deficit.     Motor: No abnormal muscle tone.     Coordination: Coordination normal.     Gait: Gait normal.     Deep Tendon Reflexes: Reflexes are normal and symmetric. Reflexes normal.  Psychiatric:        Behavior: Behavior normal.        Thought Content: Thought content normal.   Caries  LS w/pain  Lab Results  Component Value Date   WBC 9.9 02/21/2022   HGB 13.7 02/21/2022   HCT 40.5 02/21/2022   PLT 266  02/21/2022   GLUCOSE 226 (H) 05/29/2022   CHOL 178 03/17/2021   TRIG 255.0 (H) 03/17/2021   HDL 42.90 03/17/2021   LDLDIRECT 59.0 03/17/2021   LDLCALC 59 01/02/2014   ALT 9 05/29/2022   AST 10 05/29/2022   NA 138 05/29/2022  K 4.1 05/29/2022   CL 100 05/29/2022   CREATININE 1.40 05/29/2022   BUN 16 05/29/2022   CO2 25 05/29/2022   TSH 1.31 01/01/2018   PSA 15.00 (H) 05/29/2022   HGBA1C 7.0 (H) 05/29/2022   MICROALBUR 0.7 11/24/2016    CT Renal Stone Study  Result Date: 02/21/2022 CLINICAL DATA:  Flank pain, kidney stone suspected. EXAM: CT ABDOMEN AND PELVIS WITHOUT CONTRAST TECHNIQUE: Multidetector CT imaging of the abdomen and pelvis was performed following the standard protocol without IV contrast. RADIATION DOSE REDUCTION: This exam was performed according to the departmental dose-optimization program which includes automated exposure control, adjustment of the mA and/or kV according to patient size and/or use of iterative reconstruction technique. COMPARISON:  None Available. FINDINGS: Lower chest: The heart is normal in size and there is no pericardial effusion. Multi-vessel coronary artery calcifications are noted. The lung bases are clear. Hepatobiliary: No focal liver abnormality is seen. No gallstones, gallbladder wall thickening, or biliary dilatation. Pancreas: Unremarkable. No pancreatic ductal dilatation or surrounding inflammatory changes. Spleen: Normal in size without focal abnormality. Adrenals/Urinary Tract: No adrenal nodule or mass. A nonobstructive calculus is present in the mid right kidney measuring 4 mm. No ureteral calculus hydroureteronephrosis bilaterally. Multiple small stones are present in the urinary bladder bilaterally. Stomach/Bowel: Stomach is within normal limits. Appendix appears normal. No evidence of bowel wall thickening, distention, or inflammatory changes. No free air or pneumatosis. A few scattered diverticula are present along the colon without  evidence of diverticulitis. Vascular/Lymphatic: Aortic atherosclerosis. No enlarged abdominal or pelvic lymph nodes. Reproductive: The prostate gland is enlarged. Other: A fat containing periumbilical hernia is noted. Fat containing inguinal hernias are present bilaterally. No abdominopelvic ascites. Musculoskeletal: Degenerative changes are present in the thoracolumbar spine. No acute osseous abnormality. IMPRESSION: 1. Nonobstructive right renal calculus. No ureteral calculus or obstructive uropathy bilaterally. 2. Multiple tiny bladder calculi bilaterally. 3. Enlarged prostate gland. 4. Coronary artery calcifications. 5. Aortic atherosclerosis. Electronically Signed   By: Brett Fairy M.D.   On: 02/21/2022 23:57    Assessment & Plan:   Problem List Items Addressed This Visit     Type 2 diabetes mellitus ([place]) - Primary    Cont w/Metformin, Glipizide, Actos      Relevant Orders   Comprehensive metabolic panel   Hemoglobin A1c   LOW BACK PAIN    Continue w/Norco prn  Potential benefits of a long term prn opioids use as well as potential risks (i.e. addiction risk, apnea etc) and complications (i.e. Somnolence, constipation and others) were explained to the patient and were aknowledged.      Elevated PSA    Siyon missed his Urol appt Risks associated with treatment noncompliance were discussed. Compliance was encouraged.       Relevant Orders   PSA   Caries    Cathan missed his dental appt Risks associated with treatment noncompliance were discussed. Compliance was encouraged.      B12 deficiency    Chronic. Cont on Vitamin B12      Adjustment disorder with mixed anxiety and depressed mood    Stable stress         No orders of the defined types were placed in this encounter.     Follow-up: Return in about 3 months (around 11/17/2022) for a follow-up visit.  Walker Kehr, MD

## 2022-08-18 NOTE — Assessment & Plan Note (Signed)
[place] missed his Urol appt Risks associated with treatment noncompliance were discussed. Compliance was encouraged.

## 2022-08-18 NOTE — Assessment & Plan Note (Signed)
Continue w/Norco prn  Potential benefits of a long term prn opioids use as well as potential risks (i.e. addiction risk, apnea etc) and complications (i.e. Somnolence, constipation and others) were explained to the patient and were aknowledged.

## 2022-08-18 NOTE — Telephone Encounter (Signed)
Done. Thx.

## 2022-08-29 ENCOUNTER — Ambulatory Visit: Payer: Medicare HMO | Admitting: Internal Medicine

## 2022-08-30 ENCOUNTER — Ambulatory Visit: Payer: Medicare HMO | Admitting: Internal Medicine

## 2022-09-18 ENCOUNTER — Telehealth: Payer: Self-pay | Admitting: Internal Medicine

## 2022-09-18 NOTE — Telephone Encounter (Signed)
Patient called and needs his hydrocodone-acetaminophen refilled.

## 2022-09-19 MED ORDER — HYDROCODONE-ACETAMINOPHEN 5-325 MG PO TABS: 1.0000 | ORAL_TABLET | Freq: Two times a day (BID) | ORAL | 0 refills | Status: DC | PRN

## 2022-09-19 NOTE — Telephone Encounter (Signed)
Okay.  Thanks.

## 2022-09-28 ENCOUNTER — Telehealth: Payer: Self-pay | Admitting: Internal Medicine

## 2022-09-28 NOTE — Telephone Encounter (Signed)
Patient called and states he received a letter from [employer] stating he is no longer disabled, and would like a letter from Dr. Alain Marion stating that he is still disabled. Patient can be reached at [phone].

## 2022-09-29 NOTE — Telephone Encounter (Signed)
Social Security and State use their own doctors/experts to determine disability.  They make their own assessment.  [place] needs to appeal with them.  Thanks

## 2022-10-02 ENCOUNTER — Other Ambulatory Visit (INDEPENDENT_AMBULATORY_CARE_PROVIDER_SITE_OTHER): Payer: Medicare HMO

## 2022-10-02 DIAGNOSIS — G8929 Other chronic pain: Secondary | ICD-10-CM | POA: Diagnosis not present

## 2022-10-02 DIAGNOSIS — R972 Elevated prostate specific antigen [PSA]: Secondary | ICD-10-CM

## 2022-10-02 DIAGNOSIS — E1142 Type 2 diabetes mellitus with diabetic polyneuropathy: Secondary | ICD-10-CM

## 2022-10-02 DIAGNOSIS — M5440 Lumbago with sciatica, unspecified side: Secondary | ICD-10-CM | POA: Diagnosis not present

## 2022-10-02 NOTE — Telephone Encounter (Signed)
Called pt no answer LMOM RTC w/MD response.Marland KitchenJohny Schmidt

## 2022-10-03 LAB — COMPREHENSIVE METABOLIC PANEL
ALT: 8 U/L (ref 0–53)
AST: 10 U/L (ref 0–37)
Albumin: 4.2 g/dL (ref 3.5–5.2)
Alkaline Phosphatase: 44 U/L (ref 39–117)
BUN: 13 mg/dL (ref 6–23)
CO2: 29 mEq/L (ref 19–32)
Calcium: 10.9 mg/dL — ABNORMAL HIGH (ref 8.4–10.5)
Chloride: 102 mEq/L (ref 96–112)
Creatinine, Ser: 1.32 mg/dL (ref 0.40–1.50)
GFR: 56.22 mL/min — ABNORMAL LOW (ref >60.00)
Glucose, Bld: 84 mg/dL (ref 70–99)
Potassium: 4.1 mEq/L (ref 3.5–5.1)
Sodium: 142 mEq/L (ref 135–145)
Total Bilirubin: 0.3 mg/dL (ref 0.2–1.2)
Total Protein: 6.7 g/dL (ref 6.0–8.3)

## 2022-10-03 LAB — PMP SCREEN PROFILE (10S), URINE
Amphetamine Scrn, Ur: NEGATIVE ng/mL
BARBITURATE SCREEN URINE: NEGATIVE ng/mL
BENZODIAZEPINE SCREEN, URINE: NEGATIVE ng/mL
CANNABINOIDS UR QL SCN: NEGATIVE ng/mL
Cocaine (Metab) Scrn, Ur: NEGATIVE ng/mL
Creatinine(Crt), U: 65.7 mg/dL (ref 20.0–300.0)
Methadone Screen, Urine: NEGATIVE ng/mL
OXYCODONE+OXYMORPHONE UR QL SCN: NEGATIVE ng/mL
Opiate Scrn, Ur: NEGATIVE ng/mL
Ph of Urine: 5 (ref 4.5–8.9)
Phencyclidine Qn, Ur: NEGATIVE ng/mL
Propoxyphene Scrn, Ur: NEGATIVE ng/mL

## 2022-10-03 LAB — HEMOGLOBIN A1C: Hgb A1c MFr Bld: 7 % — ABNORMAL HIGH (ref 4.6–6.5)

## 2022-10-03 LAB — PSA: PSA: 14.78 ng/mL — ABNORMAL HIGH (ref 0.10–4.00)

## 2022-10-09 ENCOUNTER — Ambulatory Visit (INDEPENDENT_AMBULATORY_CARE_PROVIDER_SITE_OTHER): Payer: Medicare HMO | Admitting: Internal Medicine

## 2022-10-09 ENCOUNTER — Encounter: Payer: Self-pay | Admitting: Internal Medicine

## 2022-10-09 VITALS — BP 130/70 | HR 106 | Temp 99.8°F | Ht 68.0 in | Wt 206.0 lb

## 2022-10-09 DIAGNOSIS — M5440 Lumbago with sciatica, unspecified side: Secondary | ICD-10-CM | POA: Diagnosis not present

## 2022-10-09 DIAGNOSIS — K029 Dental caries, unspecified: Secondary | ICD-10-CM

## 2022-10-09 DIAGNOSIS — R972 Elevated prostate specific antigen [PSA]: Secondary | ICD-10-CM

## 2022-10-09 DIAGNOSIS — G8929 Other chronic pain: Secondary | ICD-10-CM | POA: Diagnosis not present

## 2022-10-09 DIAGNOSIS — E1142 Type 2 diabetes mellitus with diabetic polyneuropathy: Secondary | ICD-10-CM

## 2022-10-09 DIAGNOSIS — I10 Essential (primary) hypertension: Secondary | ICD-10-CM

## 2022-10-09 MED ORDER — PENICILLIN V POTASSIUM 500 MG PO TABS: ORAL_TABLET | ORAL | 0 refills | Status: DC

## 2022-10-09 MED ORDER — TRIAMTERENE-HCTZ 37.5-25 MG PO TABS: 1.0000 | ORAL_TABLET | Freq: Every day | ORAL | 1 refills | Status: DC

## 2022-10-09 NOTE — Assessment & Plan Note (Signed)
Cont w/Metformin, Glipizide, Actos

## 2022-10-09 NOTE — Progress Notes (Signed)
Subjective:  Patient ID: Timothy Schmidt, male    DOB: Jan 16, 1956  Age: 67 y.o. MRN: [id number]  CC: No chief complaint on file.   HPI Timothy Schmidt presents for depression, caries, OA/LBP  Outpatient Medications Prior to Visit  Medication Sig Dispense Refill   Alcohol Swabs (B-D SINGLE USE SWABS BUTTERFLY) PADS Use one two times daily.Dx: 250.00 100 each 3   Ascorbic Acid (VITAMIN C WITH ROSE HIPS) 1000 MG tablet Take 1,000 mg by mouth daily.     aspirin EC 81 MG tablet Take 81 mg by mouth daily.     Blood Glucose Calibration (PRODIGY CONTROL SOLUTION) LOW SOLN Use as directed per meter instructions. 3 each 0   Blood Glucose Monitoring Suppl (PRODIGY AUTOCODE BLOOD GLUCOSE) W/DEVICE KIT Use two times daily as directed. 1 each 0   cholecalciferol (VITAMIN D) 1000 UNITS tablet Take 1 tablet (1,000 Units total) by mouth daily. 90 tablet 3   cyanocobalamin 1000 MCG tablet Take 0.5 tablets (500 mcg total) by mouth daily. 90 tablet 3   glipiZIDE (GLUCOTROL) 5 MG tablet TAKE 1 TABLET BY MOUTH TWICE DAILY BEFORE MEAL(S) 180 tablet 3   glucose blood (PRODIGY AUTOCODE TEST) test strip Use one two times daily. Dx: 250.00 100 each 3   guaiFENesin-Codeine (DIABETIC TUSSIN C PO) Take by mouth as needed.     HYDROcodone-acetaminophen (NORCO/VICODIN) 5-325 MG tablet Take 1 tablet by mouth 2 (two) times daily as needed for severe pain. 60 tablet 0   metFORMIN (GLUCOPHAGE) 1000 MG tablet Take 1 tablet (1,000 mg total) by mouth 2 (two) times daily with a meal. 180 tablet 3   Misc Natural Products ([employer] PROSTATE MAX +) TABS Take 1 tablet by mouth 2 (two) times daily.     pioglitazone (ACTOS) 30 MG tablet Take 1 tablet (30 mg total) by mouth daily. 90 tablet 3   potassium chloride SA (KLOR-CON M) 20 MEQ tablet Take 1 tablet (20 mEq total) by mouth daily. 90 tablet 3   PRODIGY TWIST TOP LANCETS 28G MISC Use one two times daily. Dx: 250.00 100 each 3   promethazine-dextromethorphan (PROMETHAZINE-DM)  6.25-15 MG/5ML syrup Take 5 mLs by mouth 3 (three) times daily as needed for cough. 240 mL 0   penicillin v potassium (VEETID) 500 MG tablet TAKE ONE TABLET BY MOUTH FOUR TIMES DAILY 120 tablet 0   triamterene-hydrochlorothiazide (MAXZIDE-25) 37.5-25 MG tablet Take 1 tablet by mouth once daily 90 tablet 1   No facility-administered medications prior to visit.    ROS: Review of Systems  Constitutional:  Positive for fatigue. Negative for appetite change and unexpected weight change.  HENT:  Positive for dental problem. Negative for congestion, nosebleeds, sneezing, sore throat and trouble swallowing.   Eyes:  Negative for itching and visual disturbance.  Respiratory:  Negative for cough.   Cardiovascular:  Negative for chest pain, palpitations and leg swelling.  Gastrointestinal:  Negative for abdominal distention, blood in stool, diarrhea and nausea.  Genitourinary:  Negative for frequency and hematuria.  Musculoskeletal:  Negative for back pain, gait problem, joint swelling and neck pain.  Skin:  Negative for rash.  Neurological:  Negative for dizziness, tremors, speech difficulty and weakness.  Psychiatric/Behavioral:  Positive for decreased concentration and dysphoric mood. Negative for agitation, sleep disturbance and suicidal ideas. The patient is nervous/anxious.     Objective:  BP 130/70 (BP Location: Left Arm, Patient Position: Sitting, Cuff Size: Normal)   Pulse (!) 106   Temp 99.8 F (37.7  C) (Oral)   Ht '5\' 8"'$  (1.727 m)   Wt 206 lb (93.4 kg)   SpO2 95%   BMI 31.32 kg/m   BP Readings from Last 3 Encounters:  10/09/22 130/70  08/18/22 124/72  06/12/22 110/62    Wt Readings from Last 3 Encounters:  10/09/22 206 lb (93.4 kg)  08/18/22 207 lb (93.9 kg)  06/12/22 211 lb 6.4 oz (95.9 kg)    Physical Exam Constitutional:      General: He is not in acute distress.    Appearance: He is well-developed. He is obese.     Comments: NAD  Eyes:     Conjunctiva/sclera:  Conjunctivae normal.     Pupils: Pupils are equal, round, and reactive to light.  Neck:     Thyroid: No thyromegaly.     Vascular: No JVD.  Cardiovascular:     Rate and Rhythm: Normal rate and regular rhythm.     Heart sounds: Normal heart sounds. No murmur heard.    No friction rub. No gallop.  Pulmonary:     Effort: Pulmonary effort is normal. No respiratory distress.     Breath sounds: Normal breath sounds. No wheezing or rales.  Chest:     Chest wall: No tenderness.  Abdominal:     General: Bowel sounds are normal. There is no distension.     Palpations: Abdomen is soft. There is no mass.     Tenderness: There is no abdominal tenderness. There is no guarding or rebound.  Musculoskeletal:        General: No tenderness. Normal range of motion.     Cervical back: Normal range of motion.  Lymphadenopathy:     Cervical: No cervical adenopathy.  Skin:    General: Skin is warm and dry.     Findings: No rash.  Neurological:     Mental Status: He is alert and oriented to person, place, and time.     Cranial Nerves: No cranial nerve deficit.     Motor: No abnormal muscle tone.     Coordination: Coordination normal.     Gait: Gait normal.     Deep Tendon Reflexes: Reflexes are normal and symmetric.  Psychiatric:        Behavior: Behavior normal.        Thought Content: Thought content normal.        Judgment: Judgment normal.   Caries Lumbar spine-pain on palpation and with range of motion  Lab Results  Component Value Date   WBC 9.9 02/21/2022   HGB 13.7 02/21/2022   HCT 40.5 02/21/2022   PLT 266 02/21/2022   GLUCOSE 84 10/02/2022   CHOL 178 03/17/2021   TRIG 255.0 (H) 03/17/2021   HDL 42.90 03/17/2021   LDLDIRECT 59.0 03/17/2021   LDLCALC 59 01/02/2014   ALT 8 10/02/2022   AST 10 10/02/2022   NA 142 10/02/2022   K 4.1 10/02/2022   CL 102 10/02/2022   CREATININE 1.32 10/02/2022   BUN 13 10/02/2022   CO2 29 10/02/2022   TSH 1.31 01/01/2018   PSA 14.78 (H)  10/02/2022   HGBA1C 7.0 (H) 10/02/2022   MICROALBUR 0.7 11/24/2016    CT Renal Stone Study  Result Date: 02/21/2022 CLINICAL DATA:  Flank pain, kidney stone suspected. EXAM: CT ABDOMEN AND PELVIS WITHOUT CONTRAST TECHNIQUE: Multidetector CT imaging of the abdomen and pelvis was performed following the standard protocol without IV contrast. RADIATION DOSE REDUCTION: This exam was performed according to the departmental dose-optimization program which  includes automated exposure control, adjustment of the mA and/or kV according to patient size and/or use of iterative reconstruction technique. COMPARISON:  None Available. FINDINGS: Lower chest: The heart is normal in size and there is no pericardial effusion. Multi-vessel coronary artery calcifications are noted. The lung bases are clear. Hepatobiliary: No focal liver abnormality is seen. No gallstones, gallbladder wall thickening, or biliary dilatation. Pancreas: Unremarkable. No pancreatic ductal dilatation or surrounding inflammatory changes. Spleen: Normal in size without focal abnormality. Adrenals/Urinary Tract: No adrenal nodule or mass. A nonobstructive calculus is present in the mid right kidney measuring 4 mm. No ureteral calculus hydroureteronephrosis bilaterally. Multiple small stones are present in the urinary bladder bilaterally. Stomach/Bowel: Stomach is within normal limits. Appendix appears normal. No evidence of bowel wall thickening, distention, or inflammatory changes. No free air or pneumatosis. A few scattered diverticula are present along the colon without evidence of diverticulitis. Vascular/Lymphatic: Aortic atherosclerosis. No enlarged abdominal or pelvic lymph nodes. Reproductive: The prostate gland is enlarged. Other: A fat containing periumbilical hernia is noted. Fat containing inguinal hernias are present bilaterally. No abdominopelvic ascites. Musculoskeletal: Degenerative changes are present in the thoracolumbar spine. No acute  osseous abnormality. IMPRESSION: 1. Nonobstructive right renal calculus. No ureteral calculus or obstructive uropathy bilaterally. 2. Multiple tiny bladder calculi bilaterally. 3. Enlarged prostate gland. 4. Coronary artery calcifications. 5. Aortic atherosclerosis. Electronically Signed   By: Brett Fairy M.D.   On: 02/21/2022 23:57    Assessment & Plan:   Problem List Items Addressed This Visit       Cardiovascular and Mediastinum   Essential hypertension    Chronic Cont on Triamt/HCT      Relevant Medications   triamterene-hydrochlorothiazide (MAXZIDE-25) 37.5-25 MG tablet     Digestive   Caries    Shanon Brow missed his dental appt again Risks associated with treatment noncompliance were discussed. Compliance was encouraged. PCN refilled        Endocrine   Type 2 diabetes mellitus (HCC) - Primary    Cont w/Metformin, Glipizide, Actos      Relevant Orders   Hemoglobin A1c   Comprehensive metabolic panel     Other   LOW BACK PAIN    Chronic MSK/OA Continue w/Norco prn - not every day  Potential benefits of a long term prn opioids use as well as potential risks (i.e. addiction risk, apnea etc) and complications (i.e. Somnolence, constipation and others) were explained to the patient and were aknowledged.      Elevated PSA   Relevant Orders   PSA      Meds ordered this encounter  Medications   penicillin v potassium (VEETID) 500 MG tablet    Sig: TAKE ONE TABLET BY MOUTH FOUR TIMES DAILY    Dispense:  120 tablet    Refill:  0   triamterene-hydrochlorothiazide (MAXZIDE-25) 37.5-25 MG tablet    Sig: Take 1 tablet by mouth daily.    Dispense:  90 tablet    Refill:  1      Follow-up: Return in about 3 months (around 01/07/2023) for a follow-up visit.  Walker Kehr, MD

## 2022-10-09 NOTE — Assessment & Plan Note (Signed)
Timothy Schmidt missed his dental appt again Risks associated with treatment noncompliance were discussed. Compliance was encouraged. PCN refilled

## 2022-10-09 NOTE — Assessment & Plan Note (Signed)
Chronic Cont on Triamt/HCT

## 2022-10-09 NOTE — Assessment & Plan Note (Addendum)
Chronic MSK/OA Continue w/Norco prn - not every day  Potential benefits of a long term prn opioids use as well as potential risks (i.e. addiction risk, apnea etc) and complications (i.e. Somnolence, constipation and others) were explained to the patient and were aknowledged.

## 2022-10-19 ENCOUNTER — Telehealth: Payer: Self-pay | Admitting: Internal Medicine

## 2022-10-19 NOTE — Telephone Encounter (Signed)
Caller & Relationship to patient:  self   Call back number:[phone]   Date of last office visit:   Date of next office visit:11/20/2022   Medication(s) to be refilled:Hydrocodone        Preferred Pharmacy:[facility] [place]

## 2022-10-20 MED ORDER — HYDROCODONE-ACETAMINOPHEN 5-325 MG PO TABS: 1.0000 | ORAL_TABLET | Freq: Two times a day (BID) | ORAL | 0 refills | Status: DC | PRN

## 2022-10-20 NOTE — Telephone Encounter (Signed)
ok 

## 2022-11-13 ENCOUNTER — Telehealth: Payer: Self-pay | Admitting: Internal Medicine

## 2022-11-13 NOTE — Telephone Encounter (Signed)
Prescription Request  11/13/2022  LOV: 10/09/2022  What is the name of the medication or equipment? HYDROcodone-acetaminophen (NORCO/VICODIN) 5-325 MG tablet   Have you contacted your pharmacy to request a refill? No   Which pharmacy would you like this sent to?  [facility] [address] [place] [address] Phone: [phone] Fax: [phone]    Patient notified that their request is being sent to the clinical staff for review and that they should receive a response within 2 business days.   Please advise at Mobile [phone] (mobile)

## 2022-11-15 ENCOUNTER — Ambulatory Visit: Payer: Medicare HMO | Admitting: Internal Medicine

## 2022-11-15 MED ORDER — HYDROCODONE-ACETAMINOPHEN 5-325 MG PO TABS: 1.0000 | ORAL_TABLET | Freq: Two times a day (BID) | ORAL | 0 refills | Status: DC | PRN

## 2022-11-15 NOTE — Telephone Encounter (Signed)
Okay.  Thanks.

## 2022-11-20 ENCOUNTER — Ambulatory Visit (INDEPENDENT_AMBULATORY_CARE_PROVIDER_SITE_OTHER): Payer: Medicare HMO | Admitting: Internal Medicine

## 2022-11-20 ENCOUNTER — Encounter: Payer: Self-pay | Admitting: Internal Medicine

## 2022-11-20 VITALS — BP 120/78 | HR 91 | Temp 98.3°F | Ht 68.0 in | Wt 219.0 lb

## 2022-11-20 DIAGNOSIS — I10 Essential (primary) hypertension: Secondary | ICD-10-CM | POA: Diagnosis not present

## 2022-11-20 DIAGNOSIS — E538 Deficiency of other specified B group vitamins: Secondary | ICD-10-CM | POA: Diagnosis not present

## 2022-11-20 DIAGNOSIS — M5440 Lumbago with sciatica, unspecified side: Secondary | ICD-10-CM

## 2022-11-20 DIAGNOSIS — R972 Elevated prostate specific antigen [PSA]: Secondary | ICD-10-CM

## 2022-11-20 DIAGNOSIS — K029 Dental caries, unspecified: Secondary | ICD-10-CM

## 2022-11-20 DIAGNOSIS — G8929 Other chronic pain: Secondary | ICD-10-CM

## 2022-11-20 DIAGNOSIS — E1142 Type 2 diabetes mellitus with diabetic polyneuropathy: Secondary | ICD-10-CM

## 2022-11-20 LAB — COMPREHENSIVE METABOLIC PANEL WITH GFR
ALT: 9 U/L (ref 0–53)
AST: 10 U/L (ref 0–37)
Albumin: 4.3 g/dL (ref 3.5–5.2)
Alkaline Phosphatase: 45 U/L (ref 39–117)
BUN: 13 mg/dL (ref 6–23)
CO2: 28 meq/L (ref 19–32)
Calcium: 10.3 mg/dL (ref 8.4–10.5)
Chloride: 105 meq/L (ref 96–112)
Creatinine, Ser: 1.27 mg/dL (ref 0.40–1.50)
GFR: 58.83 mL/min — ABNORMAL LOW
Glucose, Bld: 73 mg/dL (ref 70–99)
Potassium: 4 meq/L (ref 3.5–5.1)
Sodium: 139 meq/L (ref 135–145)
Total Bilirubin: 0.3 mg/dL (ref 0.2–1.2)
Total Protein: 6.9 g/dL (ref 6.0–8.3)

## 2022-11-20 LAB — MICROALBUMIN / CREATININE URINE RATIO
Creatinine,U: 50.1 mg/dL
Microalb Creat Ratio: 1.4 mg/g (ref 0.0–30.0)
Microalb, Ur: <0.7 mg/dL (ref 0.0–1.9)

## 2022-11-20 LAB — PSA: PSA: 19.76 ng/mL — ABNORMAL HIGH (ref 0.10–4.00)

## 2022-11-20 LAB — HEMOGLOBIN A1C: Hgb A1c MFr Bld: 6.9 % — ABNORMAL HIGH (ref 4.6–6.5)

## 2022-11-20 NOTE — Assessment & Plan Note (Signed)
Chronic ?Cont on Triamt/HCT ?

## 2022-11-20 NOTE — Assessment & Plan Note (Signed)
Discussed caries

## 2022-11-20 NOTE — Assessment & Plan Note (Signed)
On B12 

## 2022-11-20 NOTE — Assessment & Plan Note (Signed)
Continue w/Norco prn  Potential benefits of a long term prn opioids use as well as potential risks (i.e. addiction risk, apnea etc) and complications (i.e. Somnolence, constipation and others) were explained to the patient and were aknowledged. 

## 2022-11-20 NOTE — Progress Notes (Signed)
Subjective:  Patient ID: Timothy Schmidt, male    DOB: 09-Oct-1955  Age: 67 y.o. MRN: [id number]  CC: Follow-up (3 mnth f/u/)   HPI Timothy Schmidt presents for DM, LBP, caries  Outpatient Medications Prior to Visit  Medication Sig Dispense Refill   Alcohol Swabs (B-D SINGLE USE SWABS BUTTERFLY) PADS Use one two times daily.Dx: 250.00 100 each 3   Ascorbic Acid (VITAMIN C WITH ROSE HIPS) 1000 MG tablet Take 1,000 mg by mouth daily.     aspirin EC 81 MG tablet Take 81 mg by mouth daily.     Blood Glucose Calibration (PRODIGY CONTROL SOLUTION) LOW SOLN Use as directed per meter instructions. 3 each 0   Blood Glucose Monitoring Suppl (PRODIGY AUTOCODE BLOOD GLUCOSE) W/DEVICE KIT Use two times daily as directed. 1 each 0   cholecalciferol (VITAMIN D) 1000 UNITS tablet Take 1 tablet (1,000 Units total) by mouth daily. 90 tablet 3   cyanocobalamin 1000 MCG tablet Take 0.5 tablets (500 mcg total) by mouth daily. 90 tablet 3   diazepam (VALIUM) 5 MG tablet Take by mouth.     glipiZIDE (GLUCOTROL) 5 MG tablet TAKE 1 TABLET BY MOUTH TWICE DAILY BEFORE MEAL(S) 180 tablet 3   glucose blood (PRODIGY AUTOCODE TEST) test strip Use one two times daily. Dx: 250.00 100 each 3   guaiFENesin-Codeine (DIABETIC TUSSIN C PO) Take by mouth as needed.     HYDROcodone-acetaminophen (NORCO/VICODIN) 5-325 MG tablet Take 1 tablet by mouth 2 (two) times daily as needed for severe pain. 60 tablet 0   metFORMIN (GLUCOPHAGE) 1000 MG tablet Take 1 tablet (1,000 mg total) by mouth 2 (two) times daily with a meal. 180 tablet 3   Misc Natural Products ([employer]S PROSTATE MAX +) TABS Take 1 tablet by mouth 2 (two) times daily.     penicillin v potassium (VEETID) 500 MG tablet TAKE ONE TABLET BY MOUTH FOUR TIMES DAILY 120 tablet 0   pioglitazone (ACTOS) 30 MG tablet Take 1 tablet (30 mg total) by mouth daily. 90 tablet 3   potassium chloride SA (KLOR-CON M) 20 MEQ tablet Take 1 tablet (20 mEq total) by mouth daily. 90 tablet 3    PRODIGY TWIST TOP LANCETS 28G MISC Use one two times daily. Dx: 250.00 100 each 3   promethazine-dextromethorphan (PROMETHAZINE-DM) 6.25-15 MG/5ML syrup Take 5 mLs by mouth 3 (three) times daily as needed for cough. 240 mL 0   triamterene-hydrochlorothiazide (MAXZIDE-25) 37.5-25 MG tablet Take 1 tablet by mouth daily. 90 tablet 1   No facility-administered medications prior to visit.    ROS: Review of Systems  Constitutional:  Negative for appetite change, fatigue and unexpected weight change.  HENT:  Negative for congestion, nosebleeds, sneezing, sore throat and trouble swallowing.   Eyes:  Negative for itching and visual disturbance.  Respiratory:  Negative for cough.   Cardiovascular:  Negative for chest pain, palpitations and leg swelling.  Gastrointestinal:  Negative for abdominal distention, blood in stool, diarrhea and nausea.  Genitourinary:  Negative for frequency and hematuria.  Musculoskeletal:  Positive for back pain. Negative for gait problem, joint swelling and neck pain.  Skin:  Negative for rash.  Neurological:  Negative for dizziness, tremors, speech difficulty and weakness.  Hematological:  Does not bruise/bleed easily.  Psychiatric/Behavioral:  Positive for decreased concentration. Negative for agitation, behavioral problems, dysphoric mood, sleep disturbance and suicidal ideas. The patient is not nervous/anxious.     Objective:  BP 120/78 (BP Location: Left Arm, Patient Position:  Sitting, Cuff Size: Large)   Pulse 91   Temp 98.3 F (36.8 C) (Oral)   Ht 5\' 8"  (1.727 m)   Wt 219 lb (99.3 kg)   SpO2 98%   BMI 33.30 kg/m   BP Readings from Last 3 Encounters:  11/20/22 120/78  10/09/22 130/70  08/18/22 124/72    Wt Readings from Last 3 Encounters:  11/20/22 219 lb (99.3 kg)  10/09/22 206 lb (93.4 kg)  08/18/22 207 lb (93.9 kg)    Physical Exam Constitutional:      General: He is not in acute distress.    Appearance: He is well-developed. He is obese.      Comments: NAD  Eyes:     Conjunctiva/sclera: Conjunctivae normal.     Pupils: Pupils are equal, round, and reactive to light.  Neck:     Thyroid: No thyromegaly.     Vascular: No JVD.  Cardiovascular:     Rate and Rhythm: Normal rate and regular rhythm.     Heart sounds: Normal heart sounds. No murmur heard.    No friction rub. No gallop.  Pulmonary:     Effort: Pulmonary effort is normal. No respiratory distress.     Breath sounds: Normal breath sounds. No wheezing or rales.  Chest:     Chest wall: No tenderness.  Abdominal:     General: Bowel sounds are normal. There is no distension.     Palpations: Abdomen is soft. There is no mass.     Tenderness: There is no abdominal tenderness. There is no guarding or rebound.  Musculoskeletal:        General: No tenderness. Normal range of motion.     Cervical back: Normal range of motion.  Lymphadenopathy:     Cervical: No cervical adenopathy.  Skin:    General: Skin is warm and dry.     Findings: No rash.  Neurological:     Mental Status: He is alert and oriented to person, place, and time.     Cranial Nerves: No cranial nerve deficit.     Motor: No abnormal muscle tone.     Coordination: Coordination normal.     Gait: Gait abnormal.     Deep Tendon Reflexes: Reflexes are normal and symmetric.  Psychiatric:        Behavior: Behavior normal.        Thought Content: Thought content normal.   LS w/pain on ROM  Lab Results  Component Value Date   WBC 9.9 02/21/2022   HGB 13.7 02/21/2022   HCT 40.5 02/21/2022   PLT 266 02/21/2022   GLUCOSE 84 10/02/2022   CHOL 178 03/17/2021   TRIG 255.0 (H) 03/17/2021   HDL 42.90 03/17/2021   LDLDIRECT 59.0 03/17/2021   LDLCALC 59 01/02/2014   ALT 8 10/02/2022   AST 10 10/02/2022   NA 142 10/02/2022   K 4.1 10/02/2022   CL 102 10/02/2022   CREATININE 1.32 10/02/2022   BUN 13 10/02/2022   CO2 29 10/02/2022   TSH 1.31 01/01/2018   PSA 14.78 (H) 10/02/2022   HGBA1C 7.0 (H)  10/02/2022   MICROALBUR 0.7 11/24/2016    CT Renal Stone Study  Result Date: 02/21/2022 CLINICAL DATA:  Flank pain, kidney stone suspected. EXAM: CT ABDOMEN AND PELVIS WITHOUT CONTRAST TECHNIQUE: Multidetector CT imaging of the abdomen and pelvis was performed following the standard protocol without IV contrast. RADIATION DOSE REDUCTION: This exam was performed according to the departmental dose-optimization program which includes automated exposure control,  adjustment of the mA and/or kV according to patient size and/or use of iterative reconstruction technique. COMPARISON:  None Available. FINDINGS: Lower chest: The heart is normal in size and there is no pericardial effusion. Multi-vessel coronary artery calcifications are noted. The lung bases are clear. Hepatobiliary: No focal liver abnormality is seen. No gallstones, gallbladder wall thickening, or biliary dilatation. Pancreas: Unremarkable. No pancreatic ductal dilatation or surrounding inflammatory changes. Spleen: Normal in size without focal abnormality. Adrenals/Urinary Tract: No adrenal nodule or mass. A nonobstructive calculus is present in the mid right kidney measuring 4 mm. No ureteral calculus hydroureteronephrosis bilaterally. Multiple small stones are present in the urinary bladder bilaterally. Stomach/Bowel: Stomach is within normal limits. Appendix appears normal. No evidence of bowel wall thickening, distention, or inflammatory changes. No free air or pneumatosis. A few scattered diverticula are present along the colon without evidence of diverticulitis. Vascular/Lymphatic: Aortic atherosclerosis. No enlarged abdominal or pelvic lymph nodes. Reproductive: The prostate gland is enlarged. Other: A fat containing periumbilical hernia is noted. Fat containing inguinal hernias are present bilaterally. No abdominopelvic ascites. Musculoskeletal: Degenerative changes are present in the thoracolumbar spine. No acute osseous abnormality.  IMPRESSION: 1. Nonobstructive right renal calculus. No ureteral calculus or obstructive uropathy bilaterally. 2. Multiple tiny bladder calculi bilaterally. 3. Enlarged prostate gland. 4. Coronary artery calcifications. 5. Aortic atherosclerosis. Electronically Signed   By: Brett Fairy M.D.   On: 02/21/2022 23:57    Assessment & Plan:   Problem List Items Addressed This Visit       Cardiovascular and Mediastinum   Essential hypertension    Chronic Cont on Triamt/HCT        Digestive   Caries    Discussed caries         Endocrine   Type 2 diabetes mellitus - Primary    Cont w/Metformin, Glipizide, Actos Try to loose wt      Relevant Orders   Urine microalbumin-creatinine with uACR     Other   LOW BACK PAIN    Continue w/Norco prn  Potential benefits of a long term prn opioids use as well as potential risks (i.e. addiction risk, apnea etc) and complications (i.e. Somnolence, constipation and others) were explained to the patient and were aknowledged.      B12 deficiency    On B12         No orders of the defined types were placed in this encounter.     Follow-up: Return in about 3 months (around 02/19/2023) for a follow-up visit.  Walker Kehr, MD

## 2022-11-20 NOTE — Assessment & Plan Note (Signed)
Cont w/Metformin, Glipizide, Actos Try to loose wt

## 2022-12-11 ENCOUNTER — Telehealth: Payer: Self-pay | Admitting: Internal Medicine

## 2022-12-11 NOTE — Telephone Encounter (Signed)
MD referred pt to Dr. Earlene Plater (Urologist) referral is in epic. Inform pt will send a msg to referral coordinators to get scheduled.Marland KitchenRaechel Chute

## 2022-12-11 NOTE — Telephone Encounter (Signed)
Patient called asking about a referral to a Dr. Earlene Plater. The only referral found for a Dr. Earlene Plater is from October of 2023. Patient would like a callback to discuss this at [phone].

## 2022-12-20 ENCOUNTER — Telehealth: Payer: Self-pay | Admitting: Internal Medicine

## 2022-12-20 NOTE — Telephone Encounter (Signed)
Prescription Request  12/20/2022  LOV: 11/20/2022  What is the name of the medication or equipment? HYDROcodone-acetaminophen (NORCO/VICODIN) 5-325 MG tablet   Have you contacted your pharmacy to request a refill? No   Which pharmacy would you like this sent to?  [facility] - [place], [place] - [address] [place] [address] Phone: [phone] Fax: [phone]    Patient notified that their request is being sent to the clinical staff for review and that they should receive a response within 2 business days.   Please advise at [facility] [phone]     Next OV is 01/08/2023.

## 2022-12-22 MED ORDER — HYDROCODONE-ACETAMINOPHEN 5-325 MG PO TABS: 1.0000 | ORAL_TABLET | Freq: Two times a day (BID) | ORAL | 0 refills | Status: DC | PRN

## 2022-12-22 NOTE — Telephone Encounter (Signed)
Okay.  Thanks.

## 2022-12-22 NOTE — Telephone Encounter (Signed)
MD sent refill to [facility].Marland KitchenRaechel Chute

## 2023-01-08 ENCOUNTER — Ambulatory Visit: Payer: Medicare HMO | Admitting: Internal Medicine

## 2023-01-17 ENCOUNTER — Telehealth: Payer: Self-pay | Admitting: Internal Medicine

## 2023-01-17 MED ORDER — HYDROCODONE-ACETAMINOPHEN 5-325 MG PO TABS: 1.0000 | ORAL_TABLET | Freq: Two times a day (BID) | ORAL | 0 refills | Status: DC | PRN

## 2023-01-17 NOTE — Telephone Encounter (Signed)
MD out of the office until June 4th pls advise.../lmb 

## 2023-01-17 NOTE — Telephone Encounter (Signed)
Done erx 

## 2023-01-17 NOTE — Addendum Note (Signed)
Addended by: Corwin Levins on: 01/17/2023 11:49 AM   Modules accepted: Orders

## 2023-01-17 NOTE — Telephone Encounter (Signed)
Prescription Request  01/17/2023  LOV: 11/20/2022  What is the name of the medication or equipment? hydrocodone  Have you contacted your pharmacy to request a refill? Yes   Which pharmacy would you like this sent to?  [facility] - [place], [place] - [address] [place] [address] Phone: [phone] Fax: [phone]    Patient notified that their request is being sent to the clinical staff for review and that they should receive a response within 2 business days.   Please advise at Mobile [phone] (mobile)

## 2023-01-26 ENCOUNTER — Ambulatory Visit (INDEPENDENT_AMBULATORY_CARE_PROVIDER_SITE_OTHER): Payer: Medicare HMO | Admitting: Internal Medicine

## 2023-01-26 ENCOUNTER — Encounter: Payer: Self-pay | Admitting: Internal Medicine

## 2023-01-26 VITALS — BP 126/78 | HR 91 | Temp 98.4°F | Ht 68.0 in | Wt 208.0 lb

## 2023-01-26 DIAGNOSIS — E1142 Type 2 diabetes mellitus with diabetic polyneuropathy: Secondary | ICD-10-CM | POA: Diagnosis not present

## 2023-01-26 DIAGNOSIS — K029 Dental caries, unspecified: Secondary | ICD-10-CM

## 2023-01-26 DIAGNOSIS — F411 Generalized anxiety disorder: Secondary | ICD-10-CM | POA: Diagnosis not present

## 2023-01-26 DIAGNOSIS — F4321 Adjustment disorder with depressed mood: Secondary | ICD-10-CM

## 2023-01-26 DIAGNOSIS — Z7984 Long term (current) use of oral hypoglycemic drugs: Secondary | ICD-10-CM | POA: Diagnosis not present

## 2023-01-26 DIAGNOSIS — R972 Elevated prostate specific antigen [PSA]: Secondary | ICD-10-CM

## 2023-01-26 MED ORDER — PENICILLIN V POTASSIUM 500 MG PO TABS: ORAL_TABLET | ORAL | 0 refills | Status: DC

## 2023-01-26 NOTE — Assessment & Plan Note (Signed)
Not on Rx 

## 2023-01-26 NOTE — Assessment & Plan Note (Signed)
Brother died 2 wks ago - 67 yo; grieving

## 2023-01-26 NOTE — Assessment & Plan Note (Signed)
Discussed again Risks associated with treatment noncompliance were discussed. Compliance was encouraged.

## 2023-01-26 NOTE — Assessment & Plan Note (Signed)
Cont w/Metformin, Glipizide, Actos 

## 2023-01-26 NOTE — Progress Notes (Signed)
Subjective:  Patient ID: Timothy Schmidt, male    DOB: 1956-06-24  Age: 67 y.o. MRN: [id number]  CC: Medical Management of Chronic Issues (3 month follow up )   HPI Timothy Schmidt presents for DM, HTN, caries, LBP Brother died 2 wks ago - 34 yo Poor compliance w/medical advice discussed  Outpatient Medications Prior to Visit  Medication Sig Dispense Refill   Alcohol Swabs (B-D SINGLE USE SWABS BUTTERFLY) PADS Use one two times daily.Dx: 250.00 100 each 3   Ascorbic Acid (VITAMIN C WITH ROSE HIPS) 1000 MG tablet Take 1,000 mg by mouth daily.     aspirin EC 81 MG tablet Take 81 mg by mouth daily.     Blood Glucose Calibration (PRODIGY CONTROL SOLUTION) LOW SOLN Use as directed per meter instructions. 3 each 0   Blood Glucose Monitoring Suppl (PRODIGY AUTOCODE BLOOD GLUCOSE) W/DEVICE KIT Use two times daily as directed. 1 each 0   cholecalciferol (VITAMIN D) 1000 UNITS tablet Take 1 tablet (1,000 Units total) by mouth daily. 90 tablet 3   cyanocobalamin 1000 MCG tablet Take 0.5 tablets (500 mcg total) by mouth daily. 90 tablet 3   diazepam (VALIUM) 5 MG tablet Take by mouth.     glipiZIDE (GLUCOTROL) 5 MG tablet TAKE 1 TABLET BY MOUTH TWICE DAILY BEFORE MEAL(S) 180 tablet 3   glucose blood (PRODIGY AUTOCODE TEST) test strip Use one two times daily. Dx: 250.00 100 each 3   guaiFENesin-Codeine (DIABETIC TUSSIN C PO) Take by mouth as needed.     HYDROcodone-acetaminophen (NORCO/VICODIN) 5-325 MG tablet Take 1 tablet by mouth 2 (two) times daily as needed for severe pain. 60 tablet 0   metFORMIN (GLUCOPHAGE) 1000 MG tablet Take 1 tablet (1,000 mg total) by mouth 2 (two) times daily with a meal. 180 tablet 3   Misc Natural Products ([employer] PROSTATE MAX +) TABS Take 1 tablet by mouth 2 (two) times daily.     pioglitazone (ACTOS) 30 MG tablet Take 1 tablet (30 mg total) by mouth daily. 90 tablet 3   potassium chloride SA (KLOR-CON M) 20 MEQ tablet Take 1 tablet (20 mEq total) by mouth daily. 90  tablet 3   PRODIGY TWIST TOP LANCETS 28G MISC Use one two times daily. Dx: 250.00 100 each 3   promethazine-dextromethorphan (PROMETHAZINE-DM) 6.25-15 MG/5ML syrup Take 5 mLs by mouth 3 (three) times daily as needed for cough. 240 mL 0   triamterene-hydrochlorothiazide (MAXZIDE-25) 37.5-25 MG tablet Take 1 tablet by mouth daily. 90 tablet 1   penicillin v potassium (VEETID) 500 MG tablet TAKE ONE TABLET BY MOUTH FOUR TIMES DAILY 120 tablet 0   No facility-administered medications prior to visit.    ROS: Review of Systems  Constitutional:  Negative for appetite change, fatigue and unexpected weight change.  HENT:  Negative for congestion, nosebleeds, sneezing, sore throat and trouble swallowing.   Eyes:  Negative for itching and visual disturbance.  Respiratory:  Negative for cough.   Cardiovascular:  Negative for chest pain, palpitations and leg swelling.  Gastrointestinal:  Negative for abdominal distention, blood in stool, diarrhea and nausea.  Genitourinary:  Negative for frequency and hematuria.  Musculoskeletal:  Positive for back pain. Negative for gait problem, joint swelling and neck pain.  Skin:  Negative for rash.  Neurological:  Negative for dizziness, tremors, speech difficulty and weakness.  Psychiatric/Behavioral:  Negative for agitation, dysphoric mood and sleep disturbance. The patient is not nervous/anxious.     Objective:  BP 126/78 (BP  Location: Left Arm, Patient Position: Sitting, Cuff Size: Normal)   Pulse 91   Temp 98.4 F (36.9 C) (Oral)   Ht 5\' 8"  (1.727 m)   Wt 208 lb (94.3 kg)   SpO2 99%   BMI 31.63 kg/m   BP Readings from Last 3 Encounters:  01/26/23 126/78  11/20/22 120/78  10/09/22 130/70    Wt Readings from Last 3 Encounters:  01/26/23 208 lb (94.3 kg)  11/20/22 219 lb (99.3 kg)  10/09/22 206 lb (93.4 kg)    Physical Exam Constitutional:      General: He is not in acute distress.    Appearance: He is well-developed. He is obese.      Comments: NAD  Eyes:     Conjunctiva/sclera: Conjunctivae normal.     Pupils: Pupils are equal, round, and reactive to light.  Neck:     Thyroid: No thyromegaly.     Vascular: No JVD.  Cardiovascular:     Rate and Rhythm: Normal rate and regular rhythm.     Heart sounds: Normal heart sounds. No murmur heard.    No friction rub. No gallop.  Pulmonary:     Effort: Pulmonary effort is normal. No respiratory distress.     Breath sounds: Normal breath sounds. No wheezing or rales.  Chest:     Chest wall: No tenderness.  Abdominal:     General: Bowel sounds are normal. There is no distension.     Palpations: Abdomen is soft. There is no mass.     Tenderness: There is no abdominal tenderness. There is no guarding or rebound.  Musculoskeletal:        General: No tenderness. Normal range of motion.     Cervical back: Normal range of motion.  Lymphadenopathy:     Cervical: No cervical adenopathy.  Skin:    General: Skin is warm and dry.     Findings: No rash.  Neurological:     Mental Status: He is alert and oriented to person, place, and time.     Cranial Nerves: No cranial nerve deficit.     Motor: No abnormal muscle tone.     Coordination: Coordination normal.     Gait: Gait normal.     Deep Tendon Reflexes: Reflexes are normal and symmetric.  Psychiatric:        Behavior: Behavior normal.        Thought Content: Thought content normal.        Judgment: Judgment normal.   Caries Obese LS w/pain on ROM    A total time of 45 minutes was spent preparing to see the patient, reviewing tests, x-rays, operative reports and other medical records.  Also, obtaining history and performing comprehensive physical exam.  Additionally, counseling the patient regarding the above listed issues - caries, DM, elevated PSA and compliance.   Finally, documenting clinical information in the health records, coordination of care, educating the patient. It is a complex case.   Lab Results  Component  Value Date   WBC 9.9 02/21/2022   HGB 13.7 02/21/2022   HCT 40.5 02/21/2022   PLT 266 02/21/2022   GLUCOSE 73 11/20/2022   CHOL 178 03/17/2021   TRIG 255.0 (H) 03/17/2021   HDL 42.90 03/17/2021   LDLDIRECT 59.0 03/17/2021   LDLCALC 59 01/02/2014   ALT 9 11/20/2022   AST 10 11/20/2022   NA 139 11/20/2022   K 4.0 11/20/2022   CL 105 11/20/2022   CREATININE 1.27 11/20/2022   BUN 13  11/20/2022   CO2 28 11/20/2022   TSH 1.31 01/01/2018   PSA 19.76 (H) 11/20/2022   HGBA1C 6.9 (H) 11/20/2022   MICROALBUR <0.7 11/20/2022    CT Renal Stone Study  Result Date: 02/21/2022 CLINICAL DATA:  Flank pain, kidney stone suspected. EXAM: CT ABDOMEN AND PELVIS WITHOUT CONTRAST TECHNIQUE: Multidetector CT imaging of the abdomen and pelvis was performed following the standard protocol without IV contrast. RADIATION DOSE REDUCTION: This exam was performed according to the departmental dose-optimization program which includes automated exposure control, adjustment of the mA and/or kV according to patient size and/or use of iterative reconstruction technique. COMPARISON:  None Available. FINDINGS: Lower chest: The heart is normal in size and there is no pericardial effusion. Multi-vessel coronary artery calcifications are noted. The lung bases are clear. Hepatobiliary: No focal liver abnormality is seen. No gallstones, gallbladder wall thickening, or biliary dilatation. Pancreas: Unremarkable. No pancreatic ductal dilatation or surrounding inflammatory changes. Spleen: Normal in size without focal abnormality. Adrenals/Urinary Tract: No adrenal nodule or mass. A nonobstructive calculus is present in the mid right kidney measuring 4 mm. No ureteral calculus hydroureteronephrosis bilaterally. Multiple small stones are present in the urinary bladder bilaterally. Stomach/Bowel: Stomach is within normal limits. Appendix appears normal. No evidence of bowel wall thickening, distention, or inflammatory changes. No free air  or pneumatosis. A few scattered diverticula are present along the colon without evidence of diverticulitis. Vascular/Lymphatic: Aortic atherosclerosis. No enlarged abdominal or pelvic lymph nodes. Reproductive: The prostate gland is enlarged. Other: A fat containing periumbilical hernia is noted. Fat containing inguinal hernias are present bilaterally. No abdominopelvic ascites. Musculoskeletal: Degenerative changes are present in the thoracolumbar spine. No acute osseous abnormality. IMPRESSION: 1. Nonobstructive right renal calculus. No ureteral calculus or obstructive uropathy bilaterally. 2. Multiple tiny bladder calculi bilaterally. 3. Enlarged prostate gland. 4. Coronary artery calcifications. 5. Aortic atherosclerosis. Electronically Signed   By: Thornell Sartorius M.D.   On: 02/21/2022 23:57    Assessment & Plan:   Problem List Items Addressed This Visit     Type 2 diabetes mellitus (HCC) - Primary    Cont w/Metformin, Glipizide, Actos      Relevant Orders   Comprehensive metabolic panel   Hemoglobin A1c   Anxiety state     Not on Rx      Caries    Discussed again Risks associated with treatment noncompliance were discussed. Compliance was encouraged.      Relevant Orders   Comprehensive metabolic panel   Elevated PSA    Urol appt discussed Check PSA      Relevant Orders   PSA   Situational depression    Brother died 2 wks ago - 51 yo; grieving         Meds ordered this encounter  Medications   penicillin v potassium (VEETID) 500 MG tablet    Sig: TAKE ONE TABLET BY MOUTH FOUR TIMES DAILY    Dispense:  120 tablet    Refill:  0      Follow-up: Return in about 3 months (around 04/28/2023) for a follow-up visit.  Sonda Primes, MD

## 2023-01-26 NOTE — Assessment & Plan Note (Signed)
Urol appt discussed Check PSA

## 2023-02-16 ENCOUNTER — Telehealth: Payer: Self-pay | Admitting: Internal Medicine

## 2023-02-16 NOTE — Telephone Encounter (Signed)
Prescription Request  02/16/2023  LOV: 01/26/2023  What is the name of the medication or equipment? hydrocodone  Have you contacted your pharmacy to request a refill? Yes   Which pharmacy would you like this sent to?  [facility] - [place], [place] - [address] [place] [address] Phone: [phone] Fax: [phone]    Patient notified that their request is being sent to the clinical staff for review and that they should receive a response within 2 business days.   Please advise at Mobile [phone] (mobile)

## 2023-02-16 NOTE — Telephone Encounter (Signed)
MD out of the office will forward to his desktop to review when he return on Monday...[name]

## 2023-02-18 MED ORDER — HYDROCODONE-ACETAMINOPHEN 5-325 MG PO TABS: 1.0000 | ORAL_TABLET | Freq: Two times a day (BID) | ORAL | 0 refills | Status: DC | PRN

## 2023-02-18 NOTE — Telephone Encounter (Signed)
Okay.  Thanks.

## 2023-02-19 NOTE — Telephone Encounter (Signed)
MD sent to pof../lmb 

## 2023-02-26 ENCOUNTER — Ambulatory Visit: Payer: Medicare HMO | Admitting: Internal Medicine

## 2023-03-01 ENCOUNTER — Other Ambulatory Visit: Payer: Self-pay | Admitting: Internal Medicine

## 2023-03-05 ENCOUNTER — Other Ambulatory Visit: Payer: Self-pay | Admitting: Internal Medicine

## 2023-03-19 ENCOUNTER — Other Ambulatory Visit: Payer: Self-pay | Admitting: Internal Medicine

## 2023-03-20 ENCOUNTER — Telehealth: Payer: Self-pay | Admitting: Internal Medicine

## 2023-03-20 NOTE — Telephone Encounter (Signed)
Next OV is 04/05/2023.   Prescription Request  03/20/2023  LOV: 01/26/2023  What is the name of the medication or equipment? HYDROcodone-acetaminophen (NORCO/VICODIN) 5-325 MG tablet   Have you contacted your pharmacy to request a refill? No   Which pharmacy would you like this sent to?  [facility] - [place], [place] - [address] [place] [address] Phone: [phone] Fax: [phone]    Patient notified that their request is being sent to the clinical staff for review and that they should receive a response within 2 business days.   Please advise at [facility] [phone]

## 2023-03-21 MED ORDER — HYDROCODONE-ACETAMINOPHEN 5-325 MG PO TABS: 1.0000 | ORAL_TABLET | Freq: Two times a day (BID) | ORAL | 0 refills | Status: DC | PRN

## 2023-03-21 NOTE — Telephone Encounter (Signed)
Okay. Thank you.

## 2023-04-05 ENCOUNTER — Ambulatory Visit: Payer: Medicare HMO | Admitting: Internal Medicine

## 2023-04-16 ENCOUNTER — Ambulatory Visit: Payer: Medicare HMO | Admitting: Internal Medicine

## 2023-04-24 ENCOUNTER — Telehealth: Payer: Self-pay | Admitting: Internal Medicine

## 2023-04-24 NOTE — Telephone Encounter (Signed)
Prescription Request  04/24/2023  LOV: 01/26/2023  What is the name of the medication or equipment?  HYDROcodone-acetaminophen (NORCO/VICODIN) 5-325 MG tablet   Have you contacted your pharmacy to request a refill? No   Which pharmacy would you like this sent to?  [facility] - [place], [place] - [address] [place] [address] Phone: [phone] Fax: [phone]    Patient notified that their request is being sent to the clinical staff for review and that they should receive a response within 2 business days.   Please advise at [facility] [phone]

## 2023-04-25 MED ORDER — HYDROCODONE-ACETAMINOPHEN 5-325 MG PO TABS: 1.0000 | ORAL_TABLET | Freq: Two times a day (BID) | ORAL | 0 refills | Status: DC | PRN

## 2023-04-25 NOTE — Telephone Encounter (Signed)
Okay.  Thanks.

## 2023-04-26 ENCOUNTER — Ambulatory Visit: Payer: Medicare HMO | Admitting: Internal Medicine

## 2023-04-26 ENCOUNTER — Other Ambulatory Visit (INDEPENDENT_AMBULATORY_CARE_PROVIDER_SITE_OTHER): Payer: Medicare HMO

## 2023-04-26 DIAGNOSIS — M5440 Lumbago with sciatica, unspecified side: Secondary | ICD-10-CM | POA: Diagnosis not present

## 2023-04-26 DIAGNOSIS — E1142 Type 2 diabetes mellitus with diabetic polyneuropathy: Secondary | ICD-10-CM | POA: Diagnosis not present

## 2023-04-26 DIAGNOSIS — R972 Elevated prostate specific antigen [PSA]: Secondary | ICD-10-CM | POA: Diagnosis not present

## 2023-04-26 DIAGNOSIS — K029 Dental caries, unspecified: Secondary | ICD-10-CM

## 2023-04-26 DIAGNOSIS — G8929 Other chronic pain: Secondary | ICD-10-CM

## 2023-04-26 LAB — PSA: PSA: 14.38 ng/mL — ABNORMAL HIGH (ref 0.10–4.00)

## 2023-04-26 LAB — COMPREHENSIVE METABOLIC PANEL
ALT: 8 U/L (ref 0–53)
AST: 9 U/L (ref 0–37)
Albumin: 4 g/dL (ref 3.5–5.2)
Alkaline Phosphatase: 44 U/L (ref 39–117)
BUN: 12 mg/dL (ref 6–23)
CO2: 28 meq/L (ref 19–32)
Calcium: 10 mg/dL (ref 8.4–10.5)
Chloride: 103 meq/L (ref 96–112)
Creatinine, Ser: 1.36 mg/dL (ref 0.40–1.50)
GFR: 54.03 mL/min — ABNORMAL LOW (ref >60.00)
Glucose, Bld: 166 mg/dL — ABNORMAL HIGH (ref 70–99)
Potassium: 3.9 meq/L (ref 3.5–5.1)
Sodium: 138 meq/L (ref 135–145)
Total Bilirubin: 0.4 mg/dL (ref 0.2–1.2)
Total Protein: 6.8 g/dL (ref 6.0–8.3)

## 2023-04-26 LAB — HEMOGLOBIN A1C: Hgb A1c MFr Bld: 6.7 % — ABNORMAL HIGH (ref 4.6–6.5)

## 2023-04-27 LAB — PMP SCREEN PROFILE (10S), URINE
Amphetamine Scrn, Ur: NEGATIVE ng/mL
BARBITURATE SCREEN URINE: NEGATIVE ng/mL
BENZODIAZEPINE SCREEN, URINE: NEGATIVE ng/mL
CANNABINOIDS UR QL SCN: NEGATIVE ng/mL
Cocaine (Metab) Scrn, Ur: NEGATIVE ng/mL
Creatinine(Crt), U: 63.6 mg/dL (ref 20.0–300.0)
Methadone Screen, Urine: NEGATIVE ng/mL
OXYCODONE+OXYMORPHONE UR QL SCN: NEGATIVE ng/mL
Opiate Scrn, Ur: POSITIVE ng/mL — AB
Ph of Urine: 5.4 (ref 4.5–8.9)
Phencyclidine Qn, Ur: NEGATIVE ng/mL
Propoxyphene Scrn, Ur: NEGATIVE ng/mL

## 2023-05-17 ENCOUNTER — Telehealth: Payer: Self-pay | Admitting: Radiology

## 2023-05-17 ENCOUNTER — Encounter: Payer: Self-pay | Admitting: Internal Medicine

## 2023-05-17 ENCOUNTER — Ambulatory Visit: Payer: Medicare HMO | Admitting: Internal Medicine

## 2023-05-17 VITALS — BP 128/68 | HR 88 | Temp 99.1°F | Ht 68.0 in | Wt 208.0 lb

## 2023-05-17 DIAGNOSIS — I10 Essential (primary) hypertension: Secondary | ICD-10-CM | POA: Diagnosis not present

## 2023-05-17 DIAGNOSIS — E1142 Type 2 diabetes mellitus with diabetic polyneuropathy: Secondary | ICD-10-CM

## 2023-05-17 DIAGNOSIS — E538 Deficiency of other specified B group vitamins: Secondary | ICD-10-CM | POA: Diagnosis not present

## 2023-05-17 DIAGNOSIS — N183 Chronic kidney disease, stage 3 unspecified: Secondary | ICD-10-CM

## 2023-05-17 DIAGNOSIS — F411 Generalized anxiety disorder: Secondary | ICD-10-CM | POA: Diagnosis not present

## 2023-05-17 DIAGNOSIS — Z7984 Long term (current) use of oral hypoglycemic drugs: Secondary | ICD-10-CM

## 2023-05-17 DIAGNOSIS — R972 Elevated prostate specific antigen [PSA]: Secondary | ICD-10-CM | POA: Diagnosis not present

## 2023-05-17 NOTE — Assessment & Plan Note (Signed)
Hydrate better. Monitor GFR

## 2023-05-17 NOTE — Assessment & Plan Note (Signed)
On B12 

## 2023-05-17 NOTE — Assessment & Plan Note (Signed)
Urol appt discussed Check PSA

## 2023-05-17 NOTE — Progress Notes (Signed)
Subjective:  Patient ID: Timothy Schmidt, male    DOB: 15-Nov-1955  Age: 67 y.o. MRN: [id number]  CC: No chief complaint on file.   HPI Timothy Schmidt presents for DM, HTN, dyslipidemia, LBP  Outpatient Medications Prior to Visit  Medication Sig Dispense Refill   Alcohol Swabs (B-D SINGLE USE SWABS BUTTERFLY) PADS Use one two times daily.Dx: 250.00 100 each 3   Ascorbic Acid (VITAMIN C WITH ROSE HIPS) 1000 MG tablet Take 1,000 mg by mouth daily.     aspirin EC 81 MG tablet Take 81 mg by mouth daily.     Blood Glucose Calibration (PRODIGY CONTROL SOLUTION) LOW SOLN Use as directed per meter instructions. 3 each 0   Blood Glucose Monitoring Suppl (PRODIGY AUTOCODE BLOOD GLUCOSE) W/DEVICE KIT Use two times daily as directed. 1 each 0   cholecalciferol (VITAMIN D) 1000 UNITS tablet Take 1 tablet (1,000 Units total) by mouth daily. 90 tablet 3   cyanocobalamin 1000 MCG tablet Take 0.5 tablets (500 mcg total) by mouth daily. 90 tablet 3   diazepam (VALIUM) 5 MG tablet Take by mouth.     glipiZIDE (GLUCOTROL) 5 MG tablet TAKE 1 TABLET BY MOUTH TWICE DAILY BEFORE MEAL(S) 180 tablet 3   glucose blood (PRODIGY AUTOCODE TEST) test strip Use one two times daily. Dx: 250.00 100 each 3   guaiFENesin-Codeine (DIABETIC TUSSIN C PO) Take by mouth as needed.     HYDROcodone-acetaminophen (NORCO/VICODIN) 5-325 MG tablet Take 1 tablet by mouth 2 (two) times daily as needed for severe pain. 60 tablet 0   metFORMIN (GLUCOPHAGE) 1000 MG tablet TAKE 1 TABLET BY MOUTH TWICE DAILY WITH MEALS 180 tablet 1   Misc Natural Products ([employer]S PROSTATE MAX +) TABS Take 1 tablet by mouth 2 (two) times daily.     penicillin v potassium (VEETID) 500 MG tablet TAKE ONE TABLET BY MOUTH FOUR TIMES DAILY 120 tablet 0   pioglitazone (ACTOS) 30 MG tablet Take 1 tablet by mouth once daily 90 tablet 1   potassium chloride SA (KLOR-CON M) 20 MEQ tablet Take 1 tablet by mouth once daily 90 tablet 1   PRODIGY TWIST TOP LANCETS 28G  MISC Use one two times daily. Dx: 250.00 100 each 3   triamterene-hydrochlorothiazide (MAXZIDE-25) 37.5-25 MG tablet Take 1 tablet by mouth once daily 90 tablet 1   promethazine-dextromethorphan (PROMETHAZINE-DM) 6.25-15 MG/5ML syrup Take 5 mLs by mouth 3 (three) times daily as needed for cough. (Patient not taking: Reported on 05/17/2023) 240 mL 0   No facility-administered medications prior to visit.    ROS: Review of Systems  Constitutional:  Negative for appetite change, fatigue and unexpected weight change.  HENT:  Negative for congestion, nosebleeds, sneezing, sore throat and trouble swallowing.   Eyes:  Negative for itching and visual disturbance.  Respiratory:  Negative for cough.   Cardiovascular:  Negative for chest pain, palpitations and leg swelling.  Gastrointestinal:  Negative for abdominal distention, blood in stool, diarrhea and nausea.  Genitourinary:  Negative for frequency and hematuria.  Musculoskeletal:  Positive for back pain. Negative for gait problem, joint swelling and neck pain.  Skin:  Negative for rash.  Neurological:  Negative for dizziness, tremors, speech difficulty and weakness.  Psychiatric/Behavioral:  Negative for agitation, dysphoric mood, sleep disturbance and suicidal ideas. The patient is not nervous/anxious.     Objective:  BP 128/68 (BP Location: Left Arm, Patient Position: Sitting, Cuff Size: Normal)   Pulse 88   Temp 99.1 F (37.3  C) (Oral)   Ht 5\' 8"  (1.727 m)   Wt 208 lb (94.3 kg)   SpO2 97%   BMI 31.63 kg/m   BP Readings from Last 3 Encounters:  05/17/23 128/68  01/26/23 126/78  11/20/22 120/78    Wt Readings from Last 3 Encounters:  05/17/23 208 lb (94.3 kg)  01/26/23 208 lb (94.3 kg)  11/20/22 219 lb (99.3 kg)    Physical Exam Constitutional:      General: He is not in acute distress.    Appearance: He is well-developed. He is obese.     Comments: NAD  Eyes:     Conjunctiva/sclera: Conjunctivae normal.     Pupils:  Pupils are equal, round, and reactive to light.  Neck:     Thyroid: No thyromegaly.     Vascular: No JVD.  Cardiovascular:     Rate and Rhythm: Normal rate and regular rhythm.     Heart sounds: Normal heart sounds. No murmur heard.    No friction rub. No gallop.  Pulmonary:     Effort: Pulmonary effort is normal. No respiratory distress.     Breath sounds: Normal breath sounds. No wheezing or rales.  Chest:     Chest wall: No tenderness.  Abdominal:     General: Bowel sounds are normal. There is no distension.     Palpations: Abdomen is soft. There is no mass.     Tenderness: There is no abdominal tenderness. There is no guarding or rebound.  Musculoskeletal:        General: Normal range of motion.     Cervical back: Normal range of motion. Tenderness present.  Lymphadenopathy:     Cervical: No cervical adenopathy.  Skin:    General: Skin is warm and dry.     Findings: No rash.  Neurological:     Mental Status: He is alert and oriented to person, place, and time.     Cranial Nerves: No cranial nerve deficit.     Motor: No abnormal muscle tone.     Coordination: Coordination normal.     Gait: Gait normal.     Deep Tendon Reflexes: Reflexes are normal and symmetric.  Psychiatric:        Behavior: Behavior normal.        Thought Content: Thought content normal.        Judgment: Judgment normal.   LS w/pain  Lab Results  Component Value Date   WBC 9.9 02/21/2022   HGB 13.7 02/21/2022   HCT 40.5 02/21/2022   PLT 266 02/21/2022   GLUCOSE 166 (H) 04/26/2023   CHOL 178 03/17/2021   TRIG 255.0 (H) 03/17/2021   HDL 42.90 03/17/2021   LDLDIRECT 59.0 03/17/2021   LDLCALC 59 01/02/2014   ALT 8 04/26/2023   AST 9 04/26/2023   NA 138 04/26/2023   K 3.9 04/26/2023   CL 103 04/26/2023   CREATININE 1.36 04/26/2023   BUN 12 04/26/2023   CO2 28 04/26/2023   TSH 1.31 01/01/2018   PSA 14.38 (H) 04/26/2023   HGBA1C 6.7 (H) 04/26/2023   MICROALBUR <0.7 11/20/2022    CT Renal  Stone Study  Result Date: 02/21/2022 CLINICAL DATA:  Flank pain, kidney stone suspected. EXAM: CT ABDOMEN AND PELVIS WITHOUT CONTRAST TECHNIQUE: Multidetector CT imaging of the abdomen and pelvis was performed following the standard protocol without IV contrast. RADIATION DOSE REDUCTION: This exam was performed according to the departmental dose-optimization program which includes automated exposure control, adjustment of the mA and/or  kV according to patient size and/or use of iterative reconstruction technique. COMPARISON:  None Available. FINDINGS: Lower chest: The heart is normal in size and there is no pericardial effusion. Multi-vessel coronary artery calcifications are noted. The lung bases are clear. Hepatobiliary: No focal liver abnormality is seen. No gallstones, gallbladder wall thickening, or biliary dilatation. Pancreas: Unremarkable. No pancreatic ductal dilatation or surrounding inflammatory changes. Spleen: Normal in size without focal abnormality. Adrenals/Urinary Tract: No adrenal nodule or mass. A nonobstructive calculus is present in the mid right kidney measuring 4 mm. No ureteral calculus hydroureteronephrosis bilaterally. Multiple small stones are present in the urinary bladder bilaterally. Stomach/Bowel: Stomach is within normal limits. Appendix appears normal. No evidence of bowel wall thickening, distention, or inflammatory changes. No free air or pneumatosis. A few scattered diverticula are present along the colon without evidence of diverticulitis. Vascular/Lymphatic: Aortic atherosclerosis. No enlarged abdominal or pelvic lymph nodes. Reproductive: The prostate gland is enlarged. Other: A fat containing periumbilical hernia is noted. Fat containing inguinal hernias are present bilaterally. No abdominopelvic ascites. Musculoskeletal: Degenerative changes are present in the thoracolumbar spine. No acute osseous abnormality. IMPRESSION: 1. Nonobstructive right renal calculus. No ureteral  calculus or obstructive uropathy bilaterally. 2. Multiple tiny bladder calculi bilaterally. 3. Enlarged prostate gland. 4. Coronary artery calcifications. 5. Aortic atherosclerosis. Electronically Signed   By: Thornell Sartorius M.D.   On: 02/21/2022 23:57    Assessment & Plan:   Problem List Items Addressed This Visit     Type 2 diabetes mellitus (HCC) - Primary    Cont w/Metformin, Glipizide, Actos Check A1c      Relevant Orders   Comprehensive metabolic panel   Hemoglobin A1c   B12 deficiency    On B12      Relevant Orders   Comprehensive metabolic panel   Anxiety state     Not on Rx      Essential hypertension    Chronic Cont on Triamt/HCT      Relevant Orders   Hemoglobin A1c   Elevated PSA    Urol appt discussed Check PSA      CRI (chronic renal insufficiency), stage 3 (moderate) (HCC)    Hydrate better Monitor GFR         No orders of the defined types were placed in this encounter.     Follow-up: Return in about 3 months (around 08/16/2023) for a follow-up visit.  Sonda Primes, MD

## 2023-05-17 NOTE — Assessment & Plan Note (Signed)
Not on Rx

## 2023-05-17 NOTE — Assessment & Plan Note (Signed)
Cont w/Metformin, Glipizide, Actos Check A1c

## 2023-05-17 NOTE — Assessment & Plan Note (Signed)
Chronic ?Cont on Triamt/HCT ?

## 2023-05-17 NOTE — Telephone Encounter (Signed)
Left voice mail for patient to call back at [phone] to schedule Medicare Annual Wellness Visit    Last AWV:  06/27/21   Please schedule Sequential/Initial AWV with [facility] K. CMA

## 2023-05-18 ENCOUNTER — Telehealth: Payer: Self-pay | Admitting: Internal Medicine

## 2023-05-18 NOTE — Telephone Encounter (Signed)
Prescription Request  05/18/2023  LOV: 05/17/2023  What is the name of the medication or equipment? hydrocodone  Have you contacted your pharmacy to request a refill? Yes   Which pharmacy would you like this sent to?  [facility] - [place], [place] - [address] [place] [address] Phone: [phone] Fax: [phone]    Patient notified that their request is being sent to the clinical staff for review and that they should receive a response within 2 business days.   Please advise at Mobile [phone] (mobile)

## 2023-05-18 NOTE — Telephone Encounter (Signed)
Refill request made to soon.  Last refill was 30-day supply on 04/25/2023

## 2023-05-20 MED ORDER — HYDROCODONE-ACETAMINOPHEN 5-325 MG PO TABS: 1.0000 | ORAL_TABLET | Freq: Two times a day (BID) | ORAL | 0 refills | Status: DC | PRN

## 2023-05-20 NOTE — Addendum Note (Signed)
Addended by: Tresa Garter on: 05/20/2023 09:47 AM   Modules accepted: Orders

## 2023-05-20 NOTE — Telephone Encounter (Addendum)
Please fill only when appropriate.  A new prescription for October was emailed.  Thanks

## 2023-05-22 ENCOUNTER — Telehealth: Payer: Self-pay | Admitting: Internal Medicine

## 2023-05-22 ENCOUNTER — Other Ambulatory Visit: Payer: Self-pay | Admitting: Internal Medicine

## 2023-05-22 NOTE — Telephone Encounter (Signed)
Prescription Request  05/22/2023  LOV: 05/17/2023  What is the name of the medication or equipment?  penicillin v potassium (VEETID) 500 MG tablet   Have you contacted your pharmacy to request a refill? No   Which pharmacy would you like this sent to?  [facility] - [place], [place] - [address] [place] [address] Phone: [phone] Fax: [phone]    Patient notified that their request is being sent to the clinical staff for review and that they should receive a response within 2 business days.   Please advise at Mobile [phone] (mobile)

## 2023-05-23 ENCOUNTER — Other Ambulatory Visit: Payer: Self-pay

## 2023-05-23 MED ORDER — POTASSIUM CHLORIDE CRYS ER 20 MEQ PO TBCR: 20.0000 meq | EXTENDED_RELEASE_TABLET | Freq: Every day | ORAL | 1 refills | Status: DC

## 2023-06-02 ENCOUNTER — Other Ambulatory Visit: Payer: Self-pay | Admitting: Internal Medicine

## 2023-06-22 ENCOUNTER — Telehealth: Payer: Self-pay | Admitting: Internal Medicine

## 2023-06-22 NOTE — Telephone Encounter (Signed)
Next OV is 08/09/2023.   Prescription Request  06/22/2023  LOV: 05/17/2023  What is the name of the medication or equipment? HYDROcodone-acetaminophen (NORCO/VICODIN) 5-325 MG tablet   Have you contacted your pharmacy to request a refill? No   Which pharmacy would you like this sent to?  [facility] - [place], [place] - [address] [place] [address] Phone: [phone] Fax: [phone]    Patient notified that their request is being sent to the clinical staff for review and that they should receive a response within 2 business days.   Please advise at [facility] [phone]

## 2023-06-26 MED ORDER — HYDROCODONE-ACETAMINOPHEN 5-325 MG PO TABS: 1.0000 | ORAL_TABLET | Freq: Two times a day (BID) | ORAL | 0 refills | Status: DC | PRN

## 2023-06-26 NOTE — Telephone Encounter (Signed)
Okay.  Thanks.

## 2023-07-23 ENCOUNTER — Telehealth: Payer: Self-pay | Admitting: Internal Medicine

## 2023-07-23 NOTE — Telephone Encounter (Signed)
Prescription Request  07/23/2023  LOV: 05/17/2023  What is the name of the medication or equipment? HYDROcodone-acetaminophen (NORCO/VICODIN) 5-325 MG tablet   Have you contacted your pharmacy to request a refill? No   Which pharmacy would you like this sent to?  [id number] [place], [place] - [address] [place] [address] Phone: [phone] Fax: [phone]    Patient notified that their request is being sent to the clinical staff for review and that they should receive a response within 2 business days.   Please advise at Mobile [phone] (mobile)

## 2023-07-24 MED ORDER — HYDROCODONE-ACETAMINOPHEN 5-325 MG PO TABS: 1.0000 | ORAL_TABLET | Freq: Two times a day (BID) | ORAL | 0 refills | Status: DC | PRN

## 2023-07-24 NOTE — Telephone Encounter (Signed)
Okay.  Done.  Thanks 

## 2023-08-09 ENCOUNTER — Ambulatory Visit: Payer: Medicare HMO | Admitting: Internal Medicine

## 2023-08-20 ENCOUNTER — Other Ambulatory Visit: Payer: Self-pay | Admitting: Internal Medicine

## 2023-08-20 NOTE — Telephone Encounter (Signed)
Copied from CRM [id number]. Topic: Clinical - Medication Refill >> Aug 20, 2023  4:48 PM Sim Boast F wrote: Most Recent Primary Care Visit:  Provider: Tresa Garter  Department: [facility]  Visit Type: OFFICE VISIT  Date: 05/17/2023  Medication: HYDROcodone-acetaminophen (NORCO/VICODIN) 5-325 MG tablet [[id number]]  Has the patient contacted their pharmacy? Yes (Agent: If no, request that the patient contact the pharmacy for the refill. If patient does not wish to contact the pharmacy document the reason why and proceed with request.) (Agent: If yes, when and what did the pharmacy advise?)  Is this the correct pharmacy for this prescription? Yes If no, delete pharmacy and type the correct one.  This is the patient's preferred pharmacy:    [facility] [address] [place], [place] - [address] [place] [address] Phone: [phone] Fax: [phone]   Has the prescription been filled recently? Yes  Is the patient out of the medication? Yes  Has the patient been seen for an appointment in the last year OR does the patient have an upcoming appointment? Yes  Can we respond through MyChart? Yes  Agent: Please be advised that Rx refills may take up to 3 business days. We ask that you follow-up with your pharmacy.

## 2023-08-21 MED ORDER — HYDROCODONE-ACETAMINOPHEN 5-325 MG PO TABS: 1.0000 | ORAL_TABLET | Freq: Two times a day (BID) | ORAL | 0 refills | Status: DC | PRN

## 2023-08-23 ENCOUNTER — Encounter: Payer: Self-pay | Admitting: Internal Medicine

## 2023-08-23 ENCOUNTER — Ambulatory Visit: Payer: Medicare HMO | Admitting: Internal Medicine

## 2023-08-23 VITALS — BP 130/70 | HR 97 | Temp 98.2°F | Ht 68.0 in | Wt 205.0 lb

## 2023-08-23 DIAGNOSIS — I10 Essential (primary) hypertension: Secondary | ICD-10-CM | POA: Diagnosis not present

## 2023-08-23 DIAGNOSIS — F411 Generalized anxiety disorder: Secondary | ICD-10-CM | POA: Diagnosis not present

## 2023-08-23 DIAGNOSIS — E1142 Type 2 diabetes mellitus with diabetic polyneuropathy: Secondary | ICD-10-CM | POA: Diagnosis not present

## 2023-08-23 DIAGNOSIS — R972 Elevated prostate specific antigen [PSA]: Secondary | ICD-10-CM

## 2023-08-23 DIAGNOSIS — E538 Deficiency of other specified B group vitamins: Secondary | ICD-10-CM | POA: Diagnosis not present

## 2023-08-23 DIAGNOSIS — N183 Chronic kidney disease, stage 3 unspecified: Secondary | ICD-10-CM

## 2023-08-23 DIAGNOSIS — G8929 Other chronic pain: Secondary | ICD-10-CM | POA: Diagnosis not present

## 2023-08-23 DIAGNOSIS — Z7984 Long term (current) use of oral hypoglycemic drugs: Secondary | ICD-10-CM | POA: Diagnosis not present

## 2023-08-23 DIAGNOSIS — N2889 Other specified disorders of kidney and ureter: Secondary | ICD-10-CM

## 2023-08-23 DIAGNOSIS — M5440 Lumbago with sciatica, unspecified side: Secondary | ICD-10-CM | POA: Diagnosis not present

## 2023-08-23 MED ORDER — PIOGLITAZONE HCL 30 MG PO TABS: 30.0000 mg | ORAL_TABLET | Freq: Every day | ORAL | 1 refills | Status: DC

## 2023-08-23 MED ORDER — POTASSIUM CHLORIDE CRYS ER 20 MEQ PO TBCR: 20.0000 meq | EXTENDED_RELEASE_TABLET | Freq: Every day | ORAL | 1 refills | Status: DC

## 2023-08-23 MED ORDER — CYANOCOBALAMIN 1000 MCG PO TABS: 500.0000 ug | ORAL_TABLET | Freq: Every day | ORAL | 3 refills | Status: AC

## 2023-08-23 NOTE — Assessment & Plan Note (Signed)
Continue w/Norco prn  Potential benefits of a long term prn opioids use as well as potential risks (i.e. addiction risk, apnea etc) and complications (i.e. Somnolence, constipation and others) were explained to the patient and were aknowledged. 

## 2023-08-23 NOTE — Assessment & Plan Note (Signed)
Urol appt discussed Check PSA

## 2023-08-23 NOTE — Assessment & Plan Note (Signed)
 Not on Rx

## 2023-08-23 NOTE — Assessment & Plan Note (Signed)
Hydrate better. Monitor GFR

## 2023-08-23 NOTE — Assessment & Plan Note (Signed)
Cont w/Metformin, Glipizide, Actos Check A1c

## 2023-08-23 NOTE — Assessment & Plan Note (Signed)
 On B12

## 2023-08-23 NOTE — Assessment & Plan Note (Signed)
Chronic ?Cont on Triamt/HCT ?

## 2023-08-23 NOTE — Progress Notes (Signed)
 Subjective:  Patient ID: Timothy Schmidt, male    DOB: March 24, 1956  Age: 68 y.o. MRN: [id number]  CC: Medical Management of Chronic Issues (3 MNTH F/U)   HPI Timothy Schmidt presents for LBP, DM, HTN Seven is working 1 day a week  Outpatient Medications Prior to Visit  Medication Sig Dispense Refill   Alcohol Swabs  (B-D SINGLE USE SWABS  BUTTERFLY) PADS Use one two times daily.Dx: 250.00 100 each 3   Ascorbic Acid (VITAMIN C WITH ROSE HIPS) 1000 MG tablet Take 1,000 mg by mouth daily.     aspirin EC 81 MG tablet Take 81 mg by mouth daily.     Blood Glucose Calibration (PRODIGY CONTROL SOLUTION) LOW SOLN Use as directed per meter instructions. 3 each 0   Blood Glucose Monitoring Suppl (PRODIGY AUTOCODE BLOOD GLUCOSE) W/DEVICE KIT Use two times daily as directed. 1 each 0   cholecalciferol (VITAMIN D ) 1000 UNITS tablet Take 1 tablet (1,000 Units total) by mouth daily. 90 tablet 3   diazepam (VALIUM) 5 MG tablet Take by mouth.     glipiZIDE  (GLUCOTROL ) 5 MG tablet TAKE 1 TABLET BY MOUTH TWICE DAILY BEFORE MEAL(S) 180 tablet 3   glucose blood (PRODIGY AUTOCODE TEST) test strip Use one two times daily. Dx: 250.00 100 each 3   guaiFENesin-Codeine  (DIABETIC TUSSIN C PO) Take by mouth as needed.     HYDROcodone -acetaminophen  (NORCO/VICODIN) 5-325 MG tablet Take 1 tablet by mouth 2 (two) times daily as needed for severe pain (pain score 7-10). 60 tablet 0   metFORMIN  (GLUCOPHAGE ) 1000 MG tablet TAKE 1 TABLET BY MOUTH TWICE DAILY WITH MEALS 180 tablet 1   Misc Natural Products ([employer] PROSTATE MAX +) TABS Take 1 tablet by mouth 2 (two) times daily.     penicillin  v potassium (VEETID) 500 MG tablet Take 1 tablet by mouth 4 times daily 120 tablet 0   PRODIGY TWIST TOP LANCETS 28G MISC Use one two times daily. Dx: 250.00 100 each 3   triamterene -hydrochlorothiazide (MAXZIDE-25) 37.5-25 MG tablet Take 1 tablet by mouth once daily 90 tablet 1   cyanocobalamin  1000 MCG tablet Take 0.5 tablets (500 mcg  total) by mouth daily. 90 tablet 3   pioglitazone  (ACTOS ) 30 MG tablet Take 1 tablet by mouth once daily 90 tablet 1   potassium chloride  SA (KLOR-CON  M) 20 MEQ tablet Take 1 tablet (20 mEq total) by mouth daily. 90 tablet 1   No facility-administered medications prior to visit.    ROS: Review of Systems  Constitutional:  Negative for appetite change, fatigue and unexpected weight change.  HENT:  Negative for congestion, nosebleeds, sneezing, sore throat and trouble swallowing.   Eyes:  Negative for itching and visual disturbance.  Respiratory:  Negative for cough.   Cardiovascular:  Negative for chest pain, palpitations and leg swelling.  Gastrointestinal:  Negative for abdominal distention, blood in stool, diarrhea and nausea.  Genitourinary:  Negative for frequency and hematuria.  Musculoskeletal:  Positive for arthralgias, back pain and gait problem. Negative for joint swelling and neck pain.  Skin:  Negative for rash.  Neurological:  Negative for dizziness, tremors, speech difficulty and weakness.  Hematological:  Does not bruise/bleed easily.  Psychiatric/Behavioral:  Negative for agitation, dysphoric mood, sleep disturbance and suicidal ideas. The patient is not nervous/anxious.     Objective:  BP 130/70 (BP Location: Left Arm, Patient Position: Sitting, Cuff Size: Normal)   Pulse 97   Temp 98.2 F (36.8 C) (Oral)   Ht 5'  8 (1.727 m)   Wt 205 lb (93 kg)   SpO2 93%   BMI 31.17 kg/m   BP Readings from Last 3 Encounters:  08/23/23 130/70  05/17/23 128/68  01/26/23 126/78    Wt Readings from Last 3 Encounters:  08/23/23 205 lb (93 kg)  05/17/23 208 lb (94.3 kg)  01/26/23 208 lb (94.3 kg)    Physical Exam Constitutional:      General: He is not in acute distress.    Appearance: He is well-developed. He is obese.     Comments: NAD  Eyes:     Conjunctiva/sclera: Conjunctivae normal.     Pupils: Pupils are equal, round, and reactive to light.  Neck:     Thyroid :  No thyromegaly.     Vascular: No JVD.  Cardiovascular:     Rate and Rhythm: Normal rate and regular rhythm.     Heart sounds: Normal heart sounds. No murmur heard.    No friction rub. No gallop.  Pulmonary:     Effort: Pulmonary effort is normal. No respiratory distress.     Breath sounds: Normal breath sounds. No wheezing or rales.  Chest:     Chest wall: No tenderness.  Abdominal:     General: Bowel sounds are normal. There is no distension.     Palpations: Abdomen is soft. There is no mass.     Tenderness: There is no abdominal tenderness. There is no guarding or rebound.  Musculoskeletal:        General: Tenderness present. Normal range of motion.     Cervical back: Normal range of motion.     Right lower leg: No edema.     Left lower leg: No edema.  Lymphadenopathy:     Cervical: No cervical adenopathy.  Skin:    General: Skin is warm and dry.     Findings: No rash.  Neurological:     Mental Status: He is alert and oriented to person, place, and time.     Cranial Nerves: No cranial nerve deficit.     Motor: No abnormal muscle tone.     Coordination: Coordination normal.     Gait: Gait normal.     Deep Tendon Reflexes: Reflexes are normal and symmetric.  Psychiatric:        Behavior: Behavior normal.        Thought Content: Thought content normal.        Judgment: Judgment normal.     Lab Results  Component Value Date   WBC 9.9 02/21/2022   HGB 13.7 02/21/2022   HCT 40.5 02/21/2022   PLT 266 02/21/2022   GLUCOSE 166 (H) 04/26/2023   CHOL 178 03/17/2021   TRIG 255.0 (H) 03/17/2021   HDL 42.90 03/17/2021   LDLDIRECT 59.0 03/17/2021   LDLCALC 59 01/02/2014   ALT 8 04/26/2023   AST 9 04/26/2023   NA 138 04/26/2023   K 3.9 04/26/2023   CL 103 04/26/2023   CREATININE 1.36 04/26/2023   BUN 12 04/26/2023   CO2 28 04/26/2023   TSH 1.31 01/01/2018   PSA 14.38 (H) 04/26/2023   HGBA1C 6.7 (H) 04/26/2023   MICROALBUR <0.7 11/20/2022    CT Renal Stone  Study Result Date: 02/21/2022 CLINICAL DATA:  Flank pain, kidney stone suspected. EXAM: CT ABDOMEN AND PELVIS WITHOUT CONTRAST TECHNIQUE: Multidetector CT imaging of the abdomen and pelvis was performed following the standard protocol without IV contrast. RADIATION DOSE REDUCTION: This exam was performed according to the departmental dose-optimization program  which includes automated exposure control, adjustment of the mA and/or kV according to patient size and/or use of iterative reconstruction technique. COMPARISON:  None Available. FINDINGS: Lower chest: The heart is normal in size and there is no pericardial effusion. Multi-vessel coronary artery calcifications are noted. The lung bases are clear. Hepatobiliary: No focal liver abnormality is seen. No gallstones, gallbladder wall thickening, or biliary dilatation. Pancreas: Unremarkable. No pancreatic ductal dilatation or surrounding inflammatory changes. Spleen: Normal in size without focal abnormality. Adrenals/Urinary Tract: No adrenal nodule or mass. A nonobstructive calculus is present in the mid right kidney measuring 4 mm. No ureteral calculus hydroureteronephrosis bilaterally. Multiple small stones are present in the urinary bladder bilaterally. Stomach/Bowel: Stomach is within normal limits. Appendix appears normal. No evidence of bowel wall thickening, distention, or inflammatory changes. No free air or pneumatosis. A few scattered diverticula are present along the colon without evidence of diverticulitis. Vascular/Lymphatic: Aortic atherosclerosis. No enlarged abdominal or pelvic lymph nodes. Reproductive: The prostate gland is enlarged. Other: A fat containing periumbilical hernia is noted. Fat containing inguinal hernias are present bilaterally. No abdominopelvic ascites. Musculoskeletal: Degenerative changes are present in the thoracolumbar spine. No acute osseous abnormality. IMPRESSION: 1. Nonobstructive right renal calculus. No ureteral calculus  or obstructive uropathy bilaterally. 2. Multiple tiny bladder calculi bilaterally. 3. Enlarged prostate gland. 4. Coronary artery calcifications. 5. Aortic atherosclerosis. Electronically Signed   By: Leita Birmingham M.D.   On: 02/21/2022 23:57    Assessment & Plan:   Problem List Items Addressed This Visit     Type 2 diabetes mellitus (HCC) - Primary   Cont w/Metformin , Glipizide , Actos  Check A1c      Relevant Medications   pioglitazone  (ACTOS ) 30 MG tablet   B12 deficiency   On B12      Anxiety state    Not on Rx      Essential hypertension   Chronic Cont on Triamt/HCT      LOW BACK PAIN   Continue w/Norco prn  Potential benefits of a long term prn opioids use as well as potential risks (i.e. addiction risk, apnea etc) and complications (i.e. Somnolence, constipation and others) were explained to the patient and were aknowledged.      Elevated PSA   Urol appt discussed Check PSA      CRI (chronic renal insufficiency), stage 3 (moderate) (HCC)   Hydrate better Monitor GFR         Meds ordered this encounter  Medications   potassium chloride  SA (KLOR-CON  M) 20 MEQ tablet    Sig: Take 1 tablet (20 mEq total) by mouth daily.    Dispense:  90 tablet    Refill:  1   pioglitazone  (ACTOS ) 30 MG tablet    Sig: Take 1 tablet (30 mg total) by mouth daily.    Dispense:  90 tablet    Refill:  1   cyanocobalamin  1000 MCG tablet    Sig: Take 0.5 tablets (500 mcg total) by mouth daily.    Dispense:  90 tablet    Refill:  3      Follow-up: Return in about 3 months (around 11/21/2023) for a follow-up visit.  Marolyn Noel, MD

## 2023-08-24 ENCOUNTER — Other Ambulatory Visit: Payer: Self-pay | Admitting: Internal Medicine

## 2023-08-24 NOTE — Telephone Encounter (Signed)
 Copied from CRM [id number]. Topic: Clinical - Medication Refill >> Aug 24, 2023  9:07 AM Pinkey ORN wrote: Most Recent Primary Care Visit:  Provider: GARALD KARLYNN GAILS  Department: [facility]  Visit Type: OFFICE VISIT  Date: 08/23/2023  Medication: ***  Has the patient contacted their pharmacy?  (Agent: If no, request that the patient contact the pharmacy for the refill. If patient does not wish to contact the pharmacy document the reason why and proceed with request.) (Agent: If yes, when and what did the pharmacy advise?)  Is this the correct pharmacy for this prescription?  If no, delete pharmacy and type the correct one.  This is the patient's preferred pharmacy:  [facility] [address] [place], [place] - [address] [place] [address] Phone: [phone] Fax: [phone]   Has the prescription been filled recently?   Is the patient out of the medication?   Has the patient been seen for an appointment in the last year OR does the patient have an upcoming appointment?   Can we respond through MyChart?   Agent: Please be advised that Rx refills may take up to 3 business days. We ask that you follow-up with your pharmacy.

## 2023-08-27 ENCOUNTER — Other Ambulatory Visit: Payer: Self-pay

## 2023-08-27 ENCOUNTER — Telehealth: Payer: Self-pay

## 2023-08-27 MED ORDER — PENICILLIN V POTASSIUM 500 MG PO TABS: ORAL_TABLET | ORAL | 0 refills | Status: DC

## 2023-08-27 NOTE — Telephone Encounter (Signed)
 Copied from CRM [id number]. Topic: Clinical - Medication Refill >> Aug 27, 2023  9:07 AM Corean SAUNDERS wrote: Most Recent Primary Care Visit:  Provider: GARALD KARLYNN GAILS  Department: [facility]  Visit Type: OFFICE VISIT  Date: 08/23/2023  Medication: penicillin  v potassium (VEETID) 500 MG tablet   Has the patient contacted their pharmacy? Patient is out of re-fills  (Agent: If no, request that the patient contact the pharmacy for the refill. If patient does not wish to contact the pharmacy document the reason why and proceed with request.) (Agent: If yes, when and what did the pharmacy advise?)  Is this the correct pharmacy for this prescription? Yes If no, delete pharmacy and type the correct one.  This is the patient's preferred pharmacy:  [facility] [address] [place], [place] - [address] [place] [address] Phone: [phone] Fax: [phone]   Has the prescription been filled recently? Yes  Is the patient out of the medication? Yes  Has the patient been seen for an appointment in the last year OR does the patient have an upcoming appointment? Yes  Can we respond through MyChart? No  Agent: Please be advised that Rx refills may take up to 3 business days. We ask that you follow-up with your pharmacy.

## 2023-09-17 ENCOUNTER — Other Ambulatory Visit: Payer: Self-pay | Admitting: Internal Medicine

## 2023-09-17 NOTE — Telephone Encounter (Unsigned)
Copied from CRM [id number]. Topic: Clinical - Medication Refill >> Sep 17, 2023  2:10 PM Almira Coaster wrote: Most Recent Primary Care Visit:  Provider: Tresa Garter  Department: [facility]  Visit Type: OFFICE VISIT  Date: 08/23/2023  Medication: HYDROcodone-acetaminophen (NORCO/VICODIN) 5-325 MG tablet  Has the patient contacted their pharmacy? No, needs to contact providers office. (Agent: If no, request that the patient contact the pharmacy for the refill. If patient does not wish to contact the pharmacy document the reason why and proceed with request.) (Agent: If yes, when and what did the pharmacy advise?)  Is this the correct pharmacy for this prescription? Yes If no, delete pharmacy and type the correct one.  This is the patient's preferred pharmacy:  [facility] [address] [place], [place] - [address] [place] [address] Phone: [phone] Fax: [phone]   Has the prescription been filled recently? No  Is the patient out of the medication? No, patient has two pills left.   Has the patient been seen for an appointment in the last year OR does the patient have an upcoming appointment? Yes  Can we respond through MyChart? No  Agent: Please be advised that Rx refills may take up to 3 business days. We ask that you follow-up with your pharmacy.

## 2023-09-18 MED ORDER — HYDROCODONE-ACETAMINOPHEN 5-325 MG PO TABS: 1.0000 | ORAL_TABLET | Freq: Two times a day (BID) | ORAL | 0 refills | Status: DC | PRN

## 2023-10-09 ENCOUNTER — Other Ambulatory Visit: Payer: Self-pay | Admitting: Internal Medicine

## 2023-10-16 ENCOUNTER — Other Ambulatory Visit: Payer: Self-pay | Admitting: Internal Medicine

## 2023-10-16 NOTE — Telephone Encounter (Unsigned)
 Copied from CRM [id number]. Topic: Clinical - Medication Refill >> Oct 16, 2023  9:55 AM Theodis Sato wrote: Most Recent Primary Care Visit:  Provider: Tresa Garter  Department: [facility]  Visit Type: OFFICE VISIT  Date: 08/23/2023  Medication: HYDROcodone-acetaminophen (NORCO/VICODIN) 5-325 MG tablet  Has the patient contacted their pharmacy? No (Controlled substance)  (Agent: If no, request that the patient contact the pharmacy for the refill. If patient does not wish to contact the pharmacy document the reason why and proceed with request.) (Agent: If yes, when and what did the pharmacy advise?)  Is this the correct pharmacy for this prescription? Yes If no, delete pharmacy and type the correct one.  This is the patient's preferred pharmacy:  [facility] [address] [place], [place] - [address] [place] [address] Phone: [phone] Fax: [phone]   Has the prescription been filled recently? Yes  Is the patient out of the medication? No  Has the patient been seen for an appointment in the last year OR does the patient have an upcoming appointment? Yes  Can we respond through MyChart? No  Agent: Please be advised that Rx refills may take up to 3 business days. We ask that you follow-up with your pharmacy.

## 2023-10-18 MED ORDER — HYDROCODONE-ACETAMINOPHEN 5-325 MG PO TABS: 1.0000 | ORAL_TABLET | Freq: Two times a day (BID) | ORAL | 0 refills | Status: DC | PRN

## 2023-11-14 ENCOUNTER — Other Ambulatory Visit: Payer: Self-pay | Admitting: Internal Medicine

## 2023-11-14 NOTE — Telephone Encounter (Unsigned)
 Copied from CRM [id number]. Topic: Clinical - Medication Refill >> Nov 14, 2023 11:01 AM Elizebeth Brooking wrote: Most Recent Primary Care Visit:  Provider: Tresa Garter  Department: [facility]  Visit Type: OFFICE VISIT  Date: 08/23/2023  Medication: HYDROcodone-acetaminophen (NORCO/VICODIN) 5-325 MG tablet   Has the patient contacted their pharmacy? Yes (Agent: If no, request that the patient contact the pharmacy for the refill. If patient does not wish to contact the pharmacy document the reason why and proceed with request.) (Agent: If yes, when and what did the pharmacy advise?)  Is this the correct pharmacy for this prescription? Yes If no, delete pharmacy and type the correct one.  This is the patient's preferred pharmacy:  [facility] [address] [place], [place] - [address] [place] [address] Phone: [phone] Fax: [phone]   Has the prescription been filled recently? No  Is the patient out of the medication? Yes  Has the patient been seen for an appointment in the last year OR does the patient have an upcoming appointment? Yes  Can we respond through MyChart? No  Agent: Please be advised that Rx refills may take up to 3 business days. We ask that you follow-up with your pharmacy.

## 2023-11-15 MED ORDER — HYDROCODONE-ACETAMINOPHEN 5-325 MG PO TABS: 1.0000 | ORAL_TABLET | Freq: Two times a day (BID) | ORAL | 0 refills | Status: DC | PRN

## 2023-11-27 ENCOUNTER — Ambulatory Visit (INDEPENDENT_AMBULATORY_CARE_PROVIDER_SITE_OTHER): Payer: Medicare HMO | Admitting: Internal Medicine

## 2023-11-27 ENCOUNTER — Encounter: Payer: Self-pay | Admitting: Internal Medicine

## 2023-11-27 VITALS — BP 126/74 | HR 94 | Temp 97.8°F | Ht 68.0 in | Wt 208.0 lb

## 2023-11-27 DIAGNOSIS — M5440 Lumbago with sciatica, unspecified side: Secondary | ICD-10-CM

## 2023-11-27 DIAGNOSIS — N183 Chronic kidney disease, stage 3 unspecified: Secondary | ICD-10-CM | POA: Diagnosis not present

## 2023-11-27 DIAGNOSIS — I10 Essential (primary) hypertension: Secondary | ICD-10-CM | POA: Diagnosis not present

## 2023-11-27 DIAGNOSIS — E538 Deficiency of other specified B group vitamins: Secondary | ICD-10-CM

## 2023-11-27 DIAGNOSIS — G8929 Other chronic pain: Secondary | ICD-10-CM

## 2023-11-27 DIAGNOSIS — E1142 Type 2 diabetes mellitus with diabetic polyneuropathy: Secondary | ICD-10-CM

## 2023-11-27 DIAGNOSIS — F411 Generalized anxiety disorder: Secondary | ICD-10-CM

## 2023-11-27 DIAGNOSIS — Z7984 Long term (current) use of oral hypoglycemic drugs: Secondary | ICD-10-CM | POA: Diagnosis not present

## 2023-11-27 DIAGNOSIS — K029 Dental caries, unspecified: Secondary | ICD-10-CM | POA: Diagnosis not present

## 2023-11-27 LAB — COMPREHENSIVE METABOLIC PANEL WITH GFR
ALT: 7 U/L (ref 0–53)
AST: 9 U/L (ref 0–37)
Albumin: 4.1 g/dL (ref 3.5–5.2)
Alkaline Phosphatase: 43 U/L (ref 39–117)
BUN: 12 mg/dL (ref 6–23)
CO2: 26 meq/L (ref 19–32)
Calcium: 9.5 mg/dL (ref 8.4–10.5)
Chloride: 103 meq/L (ref 96–112)
Creatinine, Ser: 1.19 mg/dL (ref 0.40–1.50)
GFR: 63.16 mL/min (ref >60.00)
Glucose, Bld: 203 mg/dL — ABNORMAL HIGH (ref 70–99)
Potassium: 3.8 meq/L (ref 3.5–5.1)
Sodium: 138 meq/L (ref 135–145)
Total Bilirubin: 0.3 mg/dL (ref 0.2–1.2)
Total Protein: 6.8 g/dL (ref 6.0–8.3)

## 2023-11-27 LAB — HEMOGLOBIN A1C: Hgb A1c MFr Bld: 6.8 % — ABNORMAL HIGH (ref 4.6–6.5)

## 2023-11-27 NOTE — Progress Notes (Signed)
 Subjective:  Patient ID: Timothy Schmidt, male    DOB: 1956-08-04  Age: 68 y.o. MRN: [id number]  CC: Medical Management of Chronic Issues (3 months f/u)   HPI Timothy Schmidt presents for LBP, DM, HTN f/u  Outpatient Medications Prior to Visit  Medication Sig Dispense Refill   Alcohol Swabs (B-D SINGLE USE SWABS BUTTERFLY) PADS Use one two times daily.Dx: 250.00 100 each 3   Ascorbic Acid (VITAMIN C WITH ROSE HIPS) 1000 MG tablet Take 1,000 mg by mouth daily.     aspirin EC 81 MG tablet Take 81 mg by mouth daily.     Blood Glucose Calibration (PRODIGY CONTROL SOLUTION) LOW SOLN Use as directed per meter instructions. 3 each 0   Blood Glucose Monitoring Suppl (PRODIGY AUTOCODE BLOOD GLUCOSE) W/DEVICE KIT Use two times daily as directed. 1 each 0   cholecalciferol (VITAMIN D) 1000 UNITS tablet Take 1 tablet (1,000 Units total) by mouth daily. 90 tablet 3   cyanocobalamin 1000 MCG tablet Take 0.5 tablets (500 mcg total) by mouth daily. 90 tablet 3   diazepam (VALIUM) 5 MG tablet Take by mouth.     glipiZIDE (GLUCOTROL) 5 MG tablet TAKE 1 TABLET BY MOUTH TWICE DAILY BEFORE MEAL(S) 180 tablet 3   glucose blood (PRODIGY AUTOCODE TEST) test strip Use one two times daily. Dx: 250.00 100 each 3   guaiFENesin-Codeine (DIABETIC TUSSIN C PO) Take by mouth as needed.     HYDROcodone-acetaminophen (NORCO/VICODIN) 5-325 MG tablet Take 1 tablet by mouth 2 (two) times daily as needed for severe pain (pain score 7-10). 60 tablet 0   metFORMIN (GLUCOPHAGE) 1000 MG tablet TAKE 1 TABLET BY MOUTH TWICE DAILY WITH MEALS 180 tablet 0   Misc Natural Products ([employer] PROSTATE MAX +) TABS Take 1 tablet by mouth 2 (two) times daily.     penicillin v potassium (VEETID) 500 MG tablet Take 1 tablet by mouth 4 times daily 120 tablet 0   pioglitazone (ACTOS) 30 MG tablet Take 1 tablet (30 mg total) by mouth daily. 90 tablet 1   potassium chloride SA (KLOR-CON M) 20 MEQ tablet Take 1 tablet (20 mEq total) by mouth daily.  90 tablet 1   PRODIGY TWIST TOP LANCETS 28G MISC Use one two times daily. Dx: 250.00 100 each 3   triamterene-hydrochlorothiazide (MAXZIDE-25) 37.5-25 MG tablet Take 1 tablet by mouth once daily 90 tablet 0   No facility-administered medications prior to visit.    ROS: Review of Systems  Constitutional:  Negative for appetite change, fatigue and unexpected weight change.  HENT:  Negative for congestion, nosebleeds, sneezing, sore throat and trouble swallowing.   Eyes:  Negative for itching and visual disturbance.  Respiratory:  Negative for cough.   Cardiovascular:  Negative for chest pain, palpitations and leg swelling.  Gastrointestinal:  Negative for abdominal distention, blood in stool, diarrhea and nausea.  Genitourinary:  Negative for frequency and hematuria.  Musculoskeletal:  Positive for back pain. Negative for gait problem, joint swelling and neck pain.  Skin:  Negative for rash.  Neurological:  Negative for dizziness, tremors, speech difficulty and weakness.  Psychiatric/Behavioral:  Negative for agitation, dysphoric mood and sleep disturbance. The patient is not nervous/anxious.     Objective:  BP 126/74 (BP Location: Left Arm, Patient Position: Sitting)   Pulse 94   Temp 97.8 F (36.6 C) (Temporal)   Ht 5\' 8"  (1.727 m)   Wt 208 lb (94.3 kg)   SpO2 96%   BMI 31.63  kg/m   BP Readings from Last 3 Encounters:  11/27/23 126/74  08/23/23 130/70  05/17/23 128/68    Wt Readings from Last 3 Encounters:  11/27/23 208 lb (94.3 kg)  08/23/23 205 lb (93 kg)  05/17/23 208 lb (94.3 kg)    Physical Exam Constitutional:      General: He is not in acute distress.    Appearance: He is well-developed. He is obese.     Comments: NAD  Eyes:     Conjunctiva/sclera: Conjunctivae normal.     Pupils: Pupils are equal, round, and reactive to light.  Neck:     Thyroid: No thyromegaly.     Vascular: No JVD.  Cardiovascular:     Rate and Rhythm: Normal rate and regular rhythm.      Heart sounds: Normal heart sounds. No murmur heard.    No friction rub. No gallop.  Pulmonary:     Effort: Pulmonary effort is normal. No respiratory distress.     Breath sounds: Normal breath sounds. No wheezing or rales.  Chest:     Chest wall: No tenderness.  Abdominal:     General: Bowel sounds are normal. There is no distension.     Palpations: Abdomen is soft. There is no mass.     Tenderness: There is no abdominal tenderness. There is no guarding or rebound.  Musculoskeletal:        General: No tenderness. Normal range of motion.     Cervical back: Normal range of motion.     Right lower leg: No edema.     Left lower leg: No edema.  Lymphadenopathy:     Cervical: No cervical adenopathy.  Skin:    General: Skin is warm and dry.     Findings: No rash.  Neurological:     Mental Status: He is alert and oriented to person, place, and time.     Cranial Nerves: No cranial nerve deficit.     Motor: No abnormal muscle tone.     Coordination: Coordination normal.     Gait: Gait normal.     Deep Tendon Reflexes: Reflexes are normal and symmetric.  Psychiatric:        Behavior: Behavior normal.        Thought Content: Thought content normal.        Judgment: Judgment normal.     Lab Results  Component Value Date   WBC 9.9 02/21/2022   HGB 13.7 02/21/2022   HCT 40.5 02/21/2022   PLT 266 02/21/2022   GLUCOSE 166 (H) 04/26/2023   CHOL 178 03/17/2021   TRIG 255.0 (H) 03/17/2021   HDL 42.90 03/17/2021   LDLDIRECT 59.0 03/17/2021   LDLCALC 59 01/02/2014   ALT 8 04/26/2023   AST 9 04/26/2023   NA 138 04/26/2023   K 3.9 04/26/2023   CL 103 04/26/2023   CREATININE 1.36 04/26/2023   BUN 12 04/26/2023   CO2 28 04/26/2023   TSH 1.31 01/01/2018   PSA 14.38 (H) 04/26/2023   HGBA1C 6.7 (H) 04/26/2023   MICROALBUR <0.7 11/20/2022    CT Renal Stone Study Result Date: 02/21/2022 CLINICAL DATA:  Flank pain, kidney stone suspected. EXAM: CT ABDOMEN AND PELVIS WITHOUT CONTRAST  TECHNIQUE: Multidetector CT imaging of the abdomen and pelvis was performed following the standard protocol without IV contrast. RADIATION DOSE REDUCTION: This exam was performed according to the departmental dose-optimization program which includes automated exposure control, adjustment of the mA and/or kV according to patient size and/or use of  iterative reconstruction technique. COMPARISON:  None Available. FINDINGS: Lower chest: The heart is normal in size and there is no pericardial effusion. Multi-vessel coronary artery calcifications are noted. The lung bases are clear. Hepatobiliary: No focal liver abnormality is seen. No gallstones, gallbladder wall thickening, or biliary dilatation. Pancreas: Unremarkable. No pancreatic ductal dilatation or surrounding inflammatory changes. Spleen: Normal in size without focal abnormality. Adrenals/Urinary Tract: No adrenal nodule or mass. A nonobstructive calculus is present in the mid right kidney measuring 4 mm. No ureteral calculus hydroureteronephrosis bilaterally. Multiple small stones are present in the urinary bladder bilaterally. Stomach/Bowel: Stomach is within normal limits. Appendix appears normal. No evidence of bowel wall thickening, distention, or inflammatory changes. No free air or pneumatosis. A few scattered diverticula are present along the colon without evidence of diverticulitis. Vascular/Lymphatic: Aortic atherosclerosis. No enlarged abdominal or pelvic lymph nodes. Reproductive: The prostate gland is enlarged. Other: A fat containing periumbilical hernia is noted. Fat containing inguinal hernias are present bilaterally. No abdominopelvic ascites. Musculoskeletal: Degenerative changes are present in the thoracolumbar spine. No acute osseous abnormality. IMPRESSION: 1. Nonobstructive right renal calculus. No ureteral calculus or obstructive uropathy bilaterally. 2. Multiple tiny bladder calculi bilaterally. 3. Enlarged prostate gland. 4. Coronary  artery calcifications. 5. Aortic atherosclerosis. Electronically Signed   By: Thornell Sartorius M.D.   On: 02/21/2022 23:57    Assessment & Plan:   Problem List Items Addressed This Visit     Type 2 diabetes mellitus (HCC) - Primary   Cont w/Metformin, Glipizide, Actos Check A1c      B12 deficiency   On B12      Anxiety state    Not on Rx      Essential hypertension   Chronic Cont on Triamt/HCT      Caries   Discussed again Risks associated with treatment noncompliance were discussed. Compliance was encouraged.      LOW BACK PAIN   Continue w/Norco prn  Potential benefits of a long term prn opioids use as well as potential risks (i.e. addiction risk, apnea etc) and complications (i.e. Somnolence, constipation and others) were explained to the patient and were aknowledged.      CRI (chronic renal insufficiency), stage 3 (moderate) (HCC)   Hydrate better Monitor GFR         No orders of the defined types were placed in this encounter.     Follow-up: Return in about 3 months (around 02/26/2024) for a follow-up visit.  Sonda Primes, MD

## 2023-11-27 NOTE — Assessment & Plan Note (Signed)
Chronic ?Cont on Triamt/HCT ?

## 2023-11-27 NOTE — Assessment & Plan Note (Signed)
 On B12

## 2023-11-27 NOTE — Assessment & Plan Note (Signed)
Cont w/Metformin, Glipizide, Actos Check A1c

## 2023-11-27 NOTE — Assessment & Plan Note (Signed)
Continue w/Norco prn  Potential benefits of a long term prn opioids use as well as potential risks (i.e. addiction risk, apnea etc) and complications (i.e. Somnolence, constipation and others) were explained to the patient and were aknowledged. 

## 2023-11-27 NOTE — Assessment & Plan Note (Signed)
 Not on Rx

## 2023-11-27 NOTE — Assessment & Plan Note (Signed)
Hydrate better. Monitor GFR

## 2023-11-27 NOTE — Assessment & Plan Note (Signed)
Discussed again Risks associated with treatment noncompliance were discussed. Compliance was encouraged.

## 2023-11-29 ENCOUNTER — Ambulatory Visit: Payer: Medicare HMO | Admitting: Internal Medicine

## 2023-12-11 ENCOUNTER — Encounter: Payer: Self-pay | Admitting: Internal Medicine

## 2023-12-11 ENCOUNTER — Ambulatory Visit (INDEPENDENT_AMBULATORY_CARE_PROVIDER_SITE_OTHER): Admitting: Internal Medicine

## 2023-12-11 VITALS — BP 112/70 | HR 94 | Temp 98.8°F | Ht 68.0 in | Wt 206.4 lb

## 2023-12-11 DIAGNOSIS — E538 Deficiency of other specified B group vitamins: Secondary | ICD-10-CM

## 2023-12-11 DIAGNOSIS — K429 Umbilical hernia without obstruction or gangrene: Secondary | ICD-10-CM | POA: Diagnosis not present

## 2023-12-11 DIAGNOSIS — M545 Low back pain, unspecified: Secondary | ICD-10-CM

## 2023-12-11 DIAGNOSIS — R972 Elevated prostate specific antigen [PSA]: Secondary | ICD-10-CM | POA: Diagnosis not present

## 2023-12-11 DIAGNOSIS — G8929 Other chronic pain: Secondary | ICD-10-CM

## 2023-12-11 DIAGNOSIS — Z7984 Long term (current) use of oral hypoglycemic drugs: Secondary | ICD-10-CM

## 2023-12-11 DIAGNOSIS — E1142 Type 2 diabetes mellitus with diabetic polyneuropathy: Secondary | ICD-10-CM

## 2023-12-11 DIAGNOSIS — I10 Essential (primary) hypertension: Secondary | ICD-10-CM

## 2023-12-11 NOTE — Assessment & Plan Note (Signed)
 Monitor PSA

## 2023-12-11 NOTE — Progress Notes (Signed)
 Subjective:  Patient ID: Timothy Schmidt, male    DOB: 1956/05/10  Age: 68 y.o. MRN: [id number]  CC: Hearing Problem (Patient noted issues with hernia being present again (starting 2 days ago))   HPI Timothy Schmidt presents for umbilical hernia pain - resolved F/u on DM - A1c was 6.8% (worse), HTN  Outpatient Medications Prior to Visit  Medication Sig Dispense Refill   Alcohol Swabs  (B-D SINGLE USE SWABS  BUTTERFLY) PADS Use one two times daily.Dx: 250.00 100 each 3   Ascorbic Acid (VITAMIN C WITH ROSE HIPS) 1000 MG tablet Take 1,000 mg by mouth daily.     aspirin EC 81 MG tablet Take 81 mg by mouth daily.     Blood Glucose Calibration (PRODIGY CONTROL SOLUTION) LOW SOLN Use as directed per meter instructions. 3 each 0   Blood Glucose Monitoring Suppl (PRODIGY AUTOCODE BLOOD GLUCOSE) W/DEVICE KIT Use two times daily as directed. 1 each 0   cholecalciferol (VITAMIN D ) 1000 UNITS tablet Take 1 tablet (1,000 Units total) by mouth daily. 90 tablet 3   cyanocobalamin  1000 MCG tablet Take 0.5 tablets (500 mcg total) by mouth daily. 90 tablet 3   diazepam (VALIUM) 5 MG tablet Take by mouth.     glipiZIDE  (GLUCOTROL ) 5 MG tablet TAKE 1 TABLET BY MOUTH TWICE DAILY BEFORE MEAL(S) 180 tablet 3   glucose blood (PRODIGY AUTOCODE TEST) test strip Use one two times daily. Dx: 250.00 100 each 3   guaiFENesin-Codeine  (DIABETIC TUSSIN C PO) Take by mouth as needed.     HYDROcodone -acetaminophen  (NORCO/VICODIN) 5-325 MG tablet Take 1 tablet by mouth 2 (two) times daily as needed for severe pain (pain score 7-10). 60 tablet 0   metFORMIN  (GLUCOPHAGE ) 1000 MG tablet TAKE 1 TABLET BY MOUTH TWICE DAILY WITH MEALS 180 tablet 0   Misc Natural Products (CVS PROSTATE MAX +) TABS Take 1 tablet by mouth 2 (two) times daily.     penicillin  v potassium (VEETID) 500 MG tablet Take 1 tablet by mouth 4 times daily 120 tablet 0   pioglitazone  (ACTOS ) 30 MG tablet Take 1 tablet (30 mg total) by mouth daily. 90 tablet 1    potassium chloride  SA (KLOR-CON  M) 20 MEQ tablet Take 1 tablet (20 mEq total) by mouth daily. 90 tablet 1   PRODIGY TWIST TOP LANCETS 28G MISC Use one two times daily. Dx: 250.00 100 each 3   triamterene -hydrochlorothiazide (MAXZIDE-25) 37.5-25 MG tablet Take 1 tablet by mouth once daily 90 tablet 0   No facility-administered medications prior to visit.    ROS: Review of Systems  Constitutional:  Negative for appetite change, fatigue and unexpected weight change.  HENT:  Negative for congestion, nosebleeds, sneezing, sore throat and trouble swallowing.   Eyes:  Negative for itching and visual disturbance.  Respiratory:  Negative for cough.   Cardiovascular:  Negative for chest pain, palpitations and leg swelling.  Gastrointestinal:  Negative for abdominal distention, blood in stool, diarrhea, nausea and vomiting.  Genitourinary:  Negative for frequency and hematuria.  Musculoskeletal:  Positive for back pain. Negative for gait problem, joint swelling and neck pain.  Skin:  Negative for rash.  Neurological:  Negative for dizziness, tremors, speech difficulty and weakness.  Psychiatric/Behavioral:  Negative for agitation, dysphoric mood and sleep disturbance. The patient is not nervous/anxious.     Objective:  BP 112/70   Pulse 94   Temp 98.8 F (37.1 C)   Ht 5\' 8"  (1.727 m)   Wt 206 lb 6.4  oz (93.6 kg)   SpO2 99%   BMI 31.38 kg/m   BP Readings from Last 3 Encounters:  12/11/23 112/70  11/27/23 126/74  08/23/23 130/70    Wt Readings from Last 3 Encounters:  12/11/23 206 lb 6.4 oz (93.6 kg)  11/27/23 208 lb (94.3 kg)  08/23/23 205 lb (93 kg)    Physical Exam Constitutional:      General: He is not in acute distress.    Appearance: He is well-developed.     Comments: NAD  Eyes:     Conjunctiva/sclera: Conjunctivae normal.     Pupils: Pupils are equal, round, and reactive to light.  Neck:     Thyroid : No thyromegaly.     Vascular: No JVD.  Cardiovascular:      Rate and Rhythm: Normal rate and regular rhythm.     Heart sounds: Normal heart sounds. No murmur heard.    No friction rub. No gallop.  Pulmonary:     Effort: Pulmonary effort is normal. No respiratory distress.     Breath sounds: Normal breath sounds. No wheezing or rales.  Chest:     Chest wall: No tenderness.  Abdominal:     General: Bowel sounds are normal. There is no distension.     Palpations: Abdomen is soft. There is no mass.     Tenderness: There is no abdominal tenderness. There is no guarding or rebound.     Hernia: A hernia is present.  Musculoskeletal:        General: No tenderness. Normal range of motion.     Cervical back: Normal range of motion.  Lymphadenopathy:     Cervical: No cervical adenopathy.  Skin:    General: Skin is warm and dry.     Findings: No rash.  Neurological:     Mental Status: He is alert and oriented to person, place, and time.     Cranial Nerves: No cranial nerve deficit.     Motor: No abnormal muscle tone.     Coordination: Coordination normal.     Gait: Gait normal.     Deep Tendon Reflexes: Reflexes are normal and symmetric.  Psychiatric:        Behavior: Behavior normal.        Thought Content: Thought content normal.        Judgment: Judgment normal.   4x5 cm umbilical hernia - NT  Lab Results  Component Value Date   WBC 9.9 02/21/2022   HGB 13.7 02/21/2022   HCT 40.5 02/21/2022   PLT 266 02/21/2022   GLUCOSE 203 (H) 11/27/2023   CHOL 178 03/17/2021   TRIG 255.0 (H) 03/17/2021   HDL 42.90 03/17/2021   LDLDIRECT 59.0 03/17/2021   LDLCALC 59 01/02/2014   ALT 7 11/27/2023   AST 9 11/27/2023   NA 138 11/27/2023   K 3.8 11/27/2023   CL 103 11/27/2023   CREATININE 1.19 11/27/2023   BUN 12 11/27/2023   CO2 26 11/27/2023   TSH 1.31 01/01/2018   PSA 14.38 (H) 04/26/2023   HGBA1C 6.8 (H) 11/27/2023   MICROALBUR <0.7 11/20/2022    CT Renal Stone Study Result Date: 02/21/2022 CLINICAL DATA:  Flank pain, kidney stone  suspected. EXAM: CT ABDOMEN AND PELVIS WITHOUT CONTRAST TECHNIQUE: Multidetector CT imaging of the abdomen and pelvis was performed following the standard protocol without IV contrast. RADIATION DOSE REDUCTION: This exam was performed according to the departmental dose-optimization program which includes automated exposure control, adjustment of the mA and/or kV according  to patient size and/or use of iterative reconstruction technique. COMPARISON:  None Available. FINDINGS: Lower chest: The heart is normal in size and there is no pericardial effusion. Multi-vessel coronary artery calcifications are noted. The lung bases are clear. Hepatobiliary: No focal liver abnormality is seen. No gallstones, gallbladder wall thickening, or biliary dilatation. Pancreas: Unremarkable. No pancreatic ductal dilatation or surrounding inflammatory changes. Spleen: Normal in size without focal abnormality. Adrenals/Urinary Tract: No adrenal nodule or mass. A nonobstructive calculus is present in the mid right kidney measuring 4 mm. No ureteral calculus hydroureteronephrosis bilaterally. Multiple small stones are present in the urinary bladder bilaterally. Stomach/Bowel: Stomach is within normal limits. Appendix appears normal. No evidence of bowel wall thickening, distention, or inflammatory changes. No free air or pneumatosis. A few scattered diverticula are present along the colon without evidence of diverticulitis. Vascular/Lymphatic: Aortic atherosclerosis. No enlarged abdominal or pelvic lymph nodes. Reproductive: The prostate gland is enlarged. Other: A fat containing periumbilical hernia is noted. Fat containing inguinal hernias are present bilaterally. No abdominopelvic ascites. Musculoskeletal: Degenerative changes are present in the thoracolumbar spine. No acute osseous abnormality. IMPRESSION: 1. Nonobstructive right renal calculus. No ureteral calculus or obstructive uropathy bilaterally. 2. Multiple tiny bladder calculi  bilaterally. 3. Enlarged prostate gland. 4. Coronary artery calcifications. 5. Aortic atherosclerosis. Electronically Signed   By: Wyvonnia Heimlich M.D.   On: 02/21/2022 23:57    Assessment & Plan:   Problem List Items Addressed This Visit     Type 2 diabetes mellitus (HCC) - Primary   Cont w/Metformin , Glipizide , Actos  Risks associated with treatment and diet noncompliance were discussed. Compliance was encouraged. Check A1c q 3 months      B12 deficiency   On B12      Essential hypertension   Chronic Cont on Triamt/HCT      LOW BACK PAIN   Continue w/Norco prn  Potential benefits of a long term prn opioids use as well as potential risks (i.e. addiction risk, apnea etc) and complications (i.e. Somnolence, constipation and others) were explained to the patient and were aknowledged.      Elevated PSA   Monitor PSA      Umbilical hernia   Chronic w/rare pains. Gen surgery referral was offered again - Iziah declined         No orders of the defined types were placed in this encounter.     Follow-up: Return in about 3 months (around 03/11/2024) for a follow-up visit.  Anitra Barn, MD

## 2023-12-11 NOTE — Assessment & Plan Note (Signed)
Continue w/Norco prn  Potential benefits of a long term prn opioids use as well as potential risks (i.e. addiction risk, apnea etc) and complications (i.e. Somnolence, constipation and others) were explained to the patient and were aknowledged. 

## 2023-12-11 NOTE — Assessment & Plan Note (Signed)
 Cont w/Metformin , Glipizide , Actos  Risks associated with treatment and diet noncompliance were discussed. Compliance was encouraged. Check A1c q 3 months

## 2023-12-11 NOTE — Assessment & Plan Note (Signed)
Chronic ?Cont on Triamt/HCT ?

## 2023-12-11 NOTE — Assessment & Plan Note (Signed)
 On B12

## 2023-12-11 NOTE — Assessment & Plan Note (Signed)
 Chronic w/rare pains. Gen surgery referral was offered again - Myrtie Atkinson declined

## 2023-12-20 ENCOUNTER — Other Ambulatory Visit: Payer: Self-pay | Admitting: Internal Medicine

## 2023-12-20 NOTE — Telephone Encounter (Signed)
 Copied from CRM [id number]. Topic: Clinical - Medication Refill >> Dec 20, 2023  5:58 PM Chuck Crater wrote: Most Recent Primary Care Visit:  Provider: Genia Kettering  Department: [facility]  Visit Type: ACUTE  Date: 12/11/2023  Medication: HYDROcodone -acetaminophen  (NORCO/VICODIN) 5-325 MG tablet  Has the patient contacted their pharmacy? No (Agent: If no, request that the patient contact the pharmacy for the refill. If patient does not wish to contact the pharmacy document the reason why and proceed with request.) (Agent: If yes, when and what did the pharmacy advise?) wont fill has to go through doctor   Is this the correct pharmacy for this prescription? Yes If no, delete pharmacy and type the correct one.  This is the patient's preferred pharmacy:  [facility] [address] [place], [place] - [address] [place] [address] Phone: [phone] Fax: [phone]   Has the prescription been filled recently? Yes  Is the patient out of the medication? No 2 left  Has the patient been seen for an appointment in the last year OR does the patient have an upcoming appointment? Yes  Can we respond through MyChart? No  Agent: Please be advised that Rx refills may take up to 3 business days. We ask that you follow-up with your pharmacy.

## 2023-12-21 MED ORDER — HYDROCODONE-ACETAMINOPHEN 5-325 MG PO TABS: 1.0000 | ORAL_TABLET | Freq: Two times a day (BID) | ORAL | 0 refills | Status: DC | PRN

## 2024-01-05 ENCOUNTER — Other Ambulatory Visit: Payer: Self-pay | Admitting: Internal Medicine

## 2024-01-16 ENCOUNTER — Other Ambulatory Visit: Payer: Self-pay | Admitting: Internal Medicine

## 2024-01-16 NOTE — Telephone Encounter (Signed)
 Copied from CRM [id number]. Topic: Clinical - Medication Refill >> Jan 16, 2024  3:53 PM Alyse July wrote: Medication: HYDROcodone -acetaminophen  (NORCO/VICODIN) 5-325 MG tablet  Has the patient contacted their pharmacy? Yes (Agent: If no, request that the patient contact the pharmacy for the refill. If patient does not wish to contact the pharmacy document the reason why and proceed with request.) (Agent: If yes, when and what did the pharmacy advise?)  This is the patient's preferred pharmacy:  [id number] [address] [place], [place] - [address] [place] [address] Phone: [phone] Fax: [phone]  Is this the correct pharmacy for this prescription? Yes If no, delete pharmacy and type the correct one.   Has the prescription been filled recently? No  Is the patient out of the medication? No  Has the patient been seen for an appointment in the last year OR does the patient have an upcoming appointment? Yes  Can we respond through MyChart? No please call patient  Agent: Please be advised that Rx refills may take up to 3 business days. We ask that you follow-up with your pharmacy.

## 2024-01-17 ENCOUNTER — Other Ambulatory Visit: Payer: Self-pay | Admitting: Internal Medicine

## 2024-01-21 ENCOUNTER — Other Ambulatory Visit: Payer: Self-pay | Admitting: Internal Medicine

## 2024-01-21 MED ORDER — HYDROCODONE-ACETAMINOPHEN 5-325 MG PO TABS: 1.0000 | ORAL_TABLET | Freq: Two times a day (BID) | ORAL | 0 refills | Status: DC | PRN

## 2024-01-21 NOTE — Telephone Encounter (Signed)
 Copied from CRM [id number]. Topic: Clinical - Medication Refill >> Jan 21, 2024  4:02 PM Timothy Schmidt wrote: Medication: penicillin  v potassium (VEETID) 500 MG tablet  Has the patient contacted their pharmacy? Yes (Agent: If no, request that the patient contact the pharmacy for the refill. If patient does not wish to contact the pharmacy document the reason why and proceed with request.) (Agent: If yes, when and what did the pharmacy advise?)  This is the patient's preferred pharmacy:  [facility] [id number] - [place], [place] - [address] [place] [address] Phone: [phone] Fax: [phone]  Is this the correct pharmacy for this prescription? Yes If no, delete pharmacy and type the correct one.   Has the prescription been filled recently? Yes  Is the patient out of the medication? Yes  Has the patient been seen for an appointment in the last year OR does the patient have an upcoming appointment? Yes  Can we respond through MyChart? Yes  Agent: Please be advised that Rx refills may take up to 3 business days. We ask that you follow-up with your pharmacy.

## 2024-01-22 ENCOUNTER — Other Ambulatory Visit: Payer: Self-pay | Admitting: Internal Medicine

## 2024-02-14 ENCOUNTER — Other Ambulatory Visit: Payer: Self-pay | Admitting: Internal Medicine

## 2024-02-18 ENCOUNTER — Other Ambulatory Visit: Payer: Self-pay | Admitting: Internal Medicine

## 2024-02-18 NOTE — Telephone Encounter (Unsigned)
 Copied from CRM [id number]. Topic: Clinical - Medication Refill >> Feb 18, 2024  4:47 PM Berneda F wrote: Medication: HYDROcodone -acetaminophen  (NORCO/VICODIN) 5-325 MG tablet  Has the patient contacted their pharmacy? Yes (Agent: If no, request that the patient contact the pharmacy for the refill. If patient does not wish to contact the pharmacy document the reason why and proceed with request.) (Agent: If yes, when and what did the pharmacy advise?)  This is the patient's preferred pharmacy:  [facility] [id number] - [place], [place] - [address] [place] [address] Phone: [phone] Fax: [phone]  Is this the correct pharmacy for this prescription? Yes If no, delete pharmacy and type the correct one.   Has the prescription been filled recently? No  Is the patient out of the medication? No, 4-5 pills left  Has the patient been seen for an appointment in the last year OR does the patient have an upcoming appointment? Yes  Can we respond through MyChart? No  Agent: Please be advised that Rx refills may take up to 3 business days. We ask that you follow-up with your pharmacy.

## 2024-02-21 MED ORDER — HYDROCODONE-ACETAMINOPHEN 5-325 MG PO TABS: 1.0000 | ORAL_TABLET | Freq: Two times a day (BID) | ORAL | 0 refills | Status: DC | PRN

## 2024-02-26 ENCOUNTER — Ambulatory Visit: Admitting: Internal Medicine

## 2024-02-28 ENCOUNTER — Other Ambulatory Visit: Payer: Self-pay | Admitting: Internal Medicine

## 2024-03-03 ENCOUNTER — Telehealth: Payer: Self-pay | Admitting: Internal Medicine

## 2024-03-03 NOTE — Telephone Encounter (Signed)
 Copied from CRM [id number]. Topic: General - Other >> Feb 29, 2024  5:26 PM Lavanda D wrote: Reason for CRM: Drug Therapy Alert fax sent by Mylene Grate called to verify that the fax has been received for Dr. Garald - If it has been received please respond and send back.  Fax #: [phone]

## 2024-03-20 ENCOUNTER — Other Ambulatory Visit: Payer: Self-pay | Admitting: Internal Medicine

## 2024-03-20 ENCOUNTER — Ambulatory Visit: Admitting: Internal Medicine

## 2024-03-20 NOTE — Telephone Encounter (Unsigned)
 Copied from CRM #[id number]. Topic: Clinical - Medication Refill >> Mar 20, 2024  9:18 AM Cynthia K wrote: Medication: HYDROcodone -acetaminophen  (NORCO/VICODIN) 5-325 MG tablet   Has the patient contacted their pharmacy? Yes (Agent: If no, request that the patient contact the pharmacy for the refill. If patient does not wish to contact the pharmacy document the reason why and proceed with request.) (Agent: If yes, when and what did the pharmacy advise?) Pharmacy needs order to refill  This is the patient's preferred pharmacy:  [id number] - [place], [place] - [address] [place] [address] Phone: [phone] Fax: [phone]  Is this the correct pharmacy for this prescription? Yes If no, delete pharmacy and type the correct one.   Has the prescription been filled recently? No  Is the patient out of the medication? No  Has the patient been seen for an appointment in the last year OR does the patient have an upcoming appointment? Yes  Can we respond through MyChart? No  Agent: Please be advised that Rx refills may take up to 3 business days. We ask that you follow-up with your pharmacy.

## 2024-03-21 MED ORDER — HYDROCODONE-ACETAMINOPHEN 5-325 MG PO TABS: 1.0000 | ORAL_TABLET | Freq: Two times a day (BID) | ORAL | 0 refills | Status: DC | PRN

## 2024-03-27 ENCOUNTER — Ambulatory Visit: Admitting: Internal Medicine

## 2024-03-27 ENCOUNTER — Encounter: Payer: Self-pay | Admitting: Internal Medicine

## 2024-03-27 VITALS — BP 120/85 | HR 94 | Temp 97.9°F | Ht 68.0 in | Wt 196.0 lb

## 2024-03-27 DIAGNOSIS — N183 Chronic kidney disease, stage 3 unspecified: Secondary | ICD-10-CM

## 2024-03-27 DIAGNOSIS — F4323 Adjustment disorder with mixed anxiety and depressed mood: Secondary | ICD-10-CM | POA: Diagnosis not present

## 2024-03-27 DIAGNOSIS — R972 Elevated prostate specific antigen [PSA]: Secondary | ICD-10-CM

## 2024-03-27 DIAGNOSIS — E538 Deficiency of other specified B group vitamins: Secondary | ICD-10-CM | POA: Diagnosis not present

## 2024-03-27 DIAGNOSIS — E1142 Type 2 diabetes mellitus with diabetic polyneuropathy: Secondary | ICD-10-CM

## 2024-03-27 DIAGNOSIS — I10 Essential (primary) hypertension: Secondary | ICD-10-CM

## 2024-03-27 NOTE — Progress Notes (Signed)
 Subjective:  Patient ID: Timothy Schmidt, male    DOB: February 13, 1956  Age: 68 y.o. MRN: [id number]  CC: Medical Management of Chronic Issues (3 mnth f/u)   HPI Timothy Schmidt presents for DM, LBP, B12 def Tylin lost 2 brothers this year  Outpatient Medications Prior to Visit  Medication Sig Dispense Refill  . Alcohol Swabs  (B-D SINGLE USE SWABS  BUTTERFLY) PADS Use one two times daily.Dx: 250.00 100 each 3  . Ascorbic Acid (VITAMIN C WITH ROSE HIPS) 1000 MG tablet Take 1,000 mg by mouth daily.    SABRA aspirin EC 81 MG tablet Take 81 mg by mouth daily.    . Blood Glucose Calibration (PRODIGY CONTROL SOLUTION) LOW SOLN Use as directed per meter instructions. 3 each 0  . Blood Glucose Monitoring Suppl (PRODIGY AUTOCODE BLOOD GLUCOSE) W/DEVICE KIT Use two times daily as directed. 1 each 0  . cholecalciferol (VITAMIN D ) 1000 UNITS tablet Take 1 tablet (1,000 Units total) by mouth daily. 90 tablet 3  . cyanocobalamin  1000 MCG tablet Take 0.5 tablets (500 mcg total) by mouth daily. 90 tablet 3  . diazepam (VALIUM) 5 MG tablet Take by mouth.    . glipiZIDE  (GLUCOTROL ) 5 MG tablet TAKE 1 TABLET BY MOUTH TWICE DAILY BEFORE MEAL(S) 180 tablet 3  . glucose blood (PRODIGY AUTOCODE TEST) test strip Use one two times daily. Dx: 250.00 100 each 3  . guaiFENesin-Codeine  (DIABETIC TUSSIN C PO) Take by mouth as needed.    . HYDROcodone -acetaminophen  (NORCO/VICODIN) 5-325 MG tablet Take 1 tablet by mouth 2 (two) times daily as needed for severe pain (pain score 7-10). 60 tablet 0  . KLOR-CON  M20 20 MEQ tablet Take 1 tablet by mouth once daily 90 tablet 0  . metFORMIN  (GLUCOPHAGE ) 1000 MG tablet TAKE 1 TABLET BY MOUTH TWICE DAILY WITH MEALS 180 tablet 3  . Misc Natural Products ([employer] PROSTATE MAX +) TABS Take 1 tablet by mouth 2 (two) times daily.    . penicillin  v potassium (VEETID) 500 MG tablet Take 1 tablet by mouth 4 times daily 120 tablet 0  . pioglitazone  (ACTOS ) 30 MG tablet Take 1 tablet by mouth once  daily 90 tablet 3  . PRODIGY TWIST TOP LANCETS 28G MISC Use one two times daily. Dx: 250.00 100 each 3  . triamterene -hydrochlorothiazide (MAXZIDE-25) 37.5-25 MG tablet Take 1 tablet by mouth once daily 90 tablet 0   No facility-administered medications prior to visit.    ROS: Review of Systems  Constitutional:  Positive for unexpected weight change. Negative for appetite change and fatigue.  HENT:  Negative for congestion, nosebleeds, sneezing, sore throat and trouble swallowing.   Eyes:  Negative for itching and visual disturbance.  Respiratory:  Negative for cough.   Cardiovascular:  Negative for chest pain, palpitations and leg swelling.  Gastrointestinal:  Negative for abdominal distention, blood in stool, diarrhea and nausea.  Genitourinary:  Negative for frequency and hematuria.  Musculoskeletal:  Positive for arthralgias and back pain. Negative for gait problem, joint swelling and neck pain.  Skin:  Negative for rash.  Neurological:  Negative for dizziness, tremors, speech difficulty and weakness.  Psychiatric/Behavioral:  Positive for decreased concentration. Negative for agitation, behavioral problems, dysphoric mood and sleep disturbance. The patient is not nervous/anxious.     Objective:  BP 120/85   Pulse 94   Temp 97.9 F (36.6 C) (Oral)   Ht 5' 8 (1.727 m)   Wt 196 lb (88.9 kg)   SpO2 99%  BMI 29.80 kg/m   BP Readings from Last 3 Encounters:  03/27/24 120/85  12/11/23 112/70  11/27/23 126/74    Wt Readings from Last 3 Encounters:  03/27/24 196 lb (88.9 kg)  12/11/23 206 lb 6.4 oz (93.6 kg)  11/27/23 208 lb (94.3 kg)    Physical Exam Constitutional:      General: He is not in acute distress.    Appearance: He is well-developed. He is obese.     Comments: NAD  Eyes:     Conjunctiva/sclera: Conjunctivae normal.     Pupils: Pupils are equal, round, and reactive to light.  Neck:     Thyroid : No thyromegaly.     Vascular: No JVD.  Cardiovascular:      Rate and Rhythm: Normal rate and regular rhythm.     Heart sounds: Normal heart sounds. No murmur heard.    No friction rub. No gallop.  Pulmonary:     Effort: Pulmonary effort is normal. No respiratory distress.     Breath sounds: Normal breath sounds. No wheezing or rales.  Chest:     Chest wall: No tenderness.  Abdominal:     General: Bowel sounds are normal. There is no distension.     Palpations: Abdomen is soft. There is no mass.     Tenderness: There is no abdominal tenderness. There is no guarding or rebound.  Musculoskeletal:        General: Tenderness present. Normal range of motion.     Cervical back: Normal range of motion.     Left lower leg: No edema.  Lymphadenopathy:     Cervical: No cervical adenopathy.  Skin:    General: Skin is warm and dry.     Findings: No rash.  Neurological:     Mental Status: He is alert and oriented to person, place, and time.     Cranial Nerves: No cranial nerve deficit.     Motor: No abnormal muscle tone.     Coordination: Coordination normal.     Gait: Gait normal.     Deep Tendon Reflexes: Reflexes are normal and symmetric.  Psychiatric:        Behavior: Behavior normal.        Thought Content: Thought content normal.        Judgment: Judgment normal.     Lab Results  Component Value Date   WBC 9.9 02/21/2022   HGB 13.7 02/21/2022   HCT 40.5 02/21/2022   PLT 266 02/21/2022   GLUCOSE 203 (H) 11/27/2023   CHOL 178 03/17/2021   TRIG 255.0 (H) 03/17/2021   HDL 42.90 03/17/2021   LDLDIRECT 59.0 03/17/2021   LDLCALC 59 01/02/2014   ALT 7 11/27/2023   AST 9 11/27/2023   NA 138 11/27/2023   K 3.8 11/27/2023   CL 103 11/27/2023   CREATININE 1.19 11/27/2023   BUN 12 11/27/2023   CO2 26 11/27/2023   TSH 1.31 01/01/2018   PSA 14.38 (H) 04/26/2023   HGBA1C 6.8 (H) 11/27/2023    CT Renal Stone Study Result Date: 02/21/2022 CLINICAL DATA:  Flank pain, kidney stone suspected. EXAM: CT ABDOMEN AND PELVIS WITHOUT CONTRAST  TECHNIQUE: Multidetector CT imaging of the abdomen and pelvis was performed following the standard protocol without IV contrast. RADIATION DOSE REDUCTION: This exam was performed according to the departmental dose-optimization program which includes automated exposure control, adjustment of the mA and/or kV according to patient size and/or use of iterative reconstruction technique. COMPARISON:  None Available. FINDINGS: Lower chest:  The heart is normal in size and there is no pericardial effusion. Multi-vessel coronary artery calcifications are noted. The lung bases are clear. Hepatobiliary: No focal liver abnormality is seen. No gallstones, gallbladder wall thickening, or biliary dilatation. Pancreas: Unremarkable. No pancreatic ductal dilatation or surrounding inflammatory changes. Spleen: Normal in size without focal abnormality. Adrenals/Urinary Tract: No adrenal nodule or mass. A nonobstructive calculus is present in the mid right kidney measuring 4 mm. No ureteral calculus hydroureteronephrosis bilaterally. Multiple small stones are present in the urinary bladder bilaterally. Stomach/Bowel: Stomach is within normal limits. Appendix appears normal. No evidence of bowel wall thickening, distention, or inflammatory changes. No free air or pneumatosis. A few scattered diverticula are present along the colon without evidence of diverticulitis. Vascular/Lymphatic: Aortic atherosclerosis. No enlarged abdominal or pelvic lymph nodes. Reproductive: The prostate gland is enlarged. Other: A fat containing periumbilical hernia is noted. Fat containing inguinal hernias are present bilaterally. No abdominopelvic ascites. Musculoskeletal: Degenerative changes are present in the thoracolumbar spine. No acute osseous abnormality. IMPRESSION: 1. Nonobstructive right renal calculus. No ureteral calculus or obstructive uropathy bilaterally. 2. Multiple tiny bladder calculi bilaterally. 3. Enlarged prostate gland. 4. Coronary  artery calcifications. 5. Aortic atherosclerosis. Electronically Signed   By: Leita Birmingham M.D.   On: 02/21/2022 23:57    Assessment & Plan:   Problem List Items Addressed This Visit     Adjustment disorder with mixed anxiety and depressed mood   Grieving. Eli lost 2 brothers this year       B12 deficiency   On B12      CRI (chronic renal insufficiency), stage 3 (moderate) (HCC)   Hydrate better Monitor GFR      Relevant Orders   TSH   Urinalysis   CBC with Differential/Platelet   Lipid panel   Comprehensive metabolic panel with GFR   Hemoglobin A1c   Microalbumin / creatinine urine ratio   Elevated PSA - Primary   Monitor PSA      Relevant Orders   PSA   Essential hypertension   Type 2 diabetes mellitus (HCC)   Relevant Orders   TSH   Urinalysis   CBC with Differential/Platelet   Lipid panel   PSA   Comprehensive metabolic panel with GFR   Hemoglobin A1c   Microalbumin / creatinine urine ratio      No orders of the defined types were placed in this encounter.     Follow-up: Return in about 3 months (around 06/27/2024) for a follow-up visit.  Marolyn Noel, MD

## 2024-03-27 NOTE — Assessment & Plan Note (Signed)
 Grieving. Argenis lost 2 brothers this year

## 2024-03-27 NOTE — Assessment & Plan Note (Signed)
 On B12

## 2024-03-27 NOTE — Assessment & Plan Note (Signed)
Hydrate better. Monitor GFR

## 2024-03-27 NOTE — Assessment & Plan Note (Signed)
 Monitor PSA

## 2024-03-28 ENCOUNTER — Telehealth: Payer: Self-pay | Admitting: Internal Medicine

## 2024-03-28 LAB — COMPREHENSIVE METABOLIC PANEL WITH GFR
ALT: 8 U/L (ref 0–53)
AST: 10 U/L (ref 0–37)
Albumin: 4.2 g/dL (ref 3.5–5.2)
Alkaline Phosphatase: 44 U/L (ref 39–117)
BUN: 11 mg/dL (ref 6–23)
CO2: 29 meq/L (ref 19–32)
Calcium: 10.1 mg/dL (ref 8.4–10.5)
Chloride: 102 meq/L (ref 96–112)
Creatinine, Ser: 1.13 mg/dL (ref 0.40–1.50)
GFR: 67.04 mL/min (ref >60.00)
Glucose, Bld: 78 mg/dL (ref 70–99)
Potassium: 4.1 meq/L (ref 3.5–5.1)
Sodium: 144 meq/L (ref 135–145)
Total Bilirubin: 0.3 mg/dL (ref 0.2–1.2)
Total Protein: 6.8 g/dL (ref 6.0–8.3)

## 2024-03-28 LAB — CBC WITH DIFFERENTIAL/PLATELET
Basophils Absolute: 0.1 K/uL (ref 0.0–0.1)
Basophils Relative: 1 % (ref 0.0–3.0)
Eosinophils Absolute: 0.1 K/uL (ref 0.0–0.7)
Eosinophils Relative: 1.9 % (ref 0.0–5.0)
HCT: 41.6 % (ref 39.0–52.0)
Hemoglobin: 13.9 g/dL (ref 13.0–17.0)
Lymphocytes Relative: 39 % (ref 12.0–46.0)
Lymphs Abs: 3 K/uL (ref 0.7–4.0)
MCHC: 33.5 g/dL (ref 30.0–36.0)
MCV: 94.2 fl (ref 78.0–100.0)
Monocytes Absolute: 0.6 K/uL (ref 0.1–1.0)
Monocytes Relative: 8.5 % (ref 3.0–12.0)
Neutro Abs: 3.8 K/uL (ref 1.4–7.7)
Neutrophils Relative %: 49.6 % (ref 43.0–77.0)
Platelets: 301 K/uL (ref 150.0–400.0)
RBC: 4.41 Mil/uL (ref 4.22–5.81)
RDW: 14.5 % (ref 11.5–15.5)
WBC: 7.6 K/uL (ref 4.0–10.5)

## 2024-03-28 LAB — URINALYSIS, ROUTINE W REFLEX MICROSCOPIC
Bilirubin Urine: NEGATIVE
Hgb urine dipstick: NEGATIVE
Ketones, ur: NEGATIVE
Nitrite: NEGATIVE
Specific Gravity, Urine: 1.01 (ref 1.000–1.030)
Total Protein, Urine: NEGATIVE
Urine Glucose: NEGATIVE
Urobilinogen, UA: 0.2 (ref 0.0–1.0)
pH: 6 (ref 5.0–8.0)

## 2024-03-28 LAB — MICROALBUMIN / CREATININE URINE RATIO
Creatinine,U: 69.8 mg/dL
Microalb Creat Ratio: UNDETERMINED mg/g (ref 0.0–30.0)
Microalb, Ur: 0.7 mg/dL (ref 0.0–1.9)

## 2024-03-28 LAB — HEMOGLOBIN A1C: Hgb A1c MFr Bld: 6.8 % — ABNORMAL HIGH (ref 4.6–6.5)

## 2024-03-28 LAB — LIPID PANEL
Cholesterol: 194 mg/dL (ref 0–200)
HDL: 45 mg/dL (ref >39.00)
LDL Cholesterol: 101 mg/dL — ABNORMAL HIGH (ref 0–99)
NonHDL: 149.03
Total CHOL/HDL Ratio: 4
Triglycerides: 242 mg/dL — ABNORMAL HIGH (ref 0.0–149.0)
VLDL: 48.4 mg/dL — ABNORMAL HIGH (ref 0.0–40.0)

## 2024-03-28 LAB — TSH: TSH: 1.24 u[IU]/mL (ref 0.35–5.50)

## 2024-03-28 LAB — PSA: PSA: 21.19 ng/mL — ABNORMAL HIGH (ref 0.10–4.00)

## 2024-03-28 NOTE — Telephone Encounter (Signed)
Form has been placed on providers desk for signature.

## 2024-03-28 NOTE — Telephone Encounter (Signed)
 Pt dropped off a handicap placement form that needs to be filled out and SENT TO HIM I did let pt know that this may take up to 5-7 days to complete once this ins completed we will give him a call.. Thanks

## 2024-04-01 ENCOUNTER — Ambulatory Visit: Payer: Self-pay | Admitting: Internal Medicine

## 2024-04-01 NOTE — Telephone Encounter (Unsigned)
 Copied from CRM [id number]. Topic: General - Other >> Apr 01, 2024  1:00 PM Viola F wrote: Reason for CRM: Patient called regarding the handicap placement form, says he would like it mailed to address on file when completed. Please call him to let him know when it's put in the mail.

## 2024-04-16 ENCOUNTER — Other Ambulatory Visit: Payer: Self-pay | Admitting: Internal Medicine

## 2024-04-24 ENCOUNTER — Other Ambulatory Visit: Payer: Self-pay | Admitting: Internal Medicine

## 2024-04-24 NOTE — Telephone Encounter (Signed)
 Name of Medication: Hyrdocodone-acetaminophen  5-325mg  Name of Pharmacy: [facility] [address] [place], [place] - [address]  Last Fill or Written Date and Quantity: 03/21/2024 60tab jill Last Office Visit and Type: 03/27/24 Next Office Visit and Type: 07/08/24 Last Controlled Substance Agreement Date: 10/27/2013 Last UDS: none

## 2024-04-24 NOTE — Telephone Encounter (Unsigned)
 Copied from CRM [id number]. Topic: Clinical - Medication Refill >> Apr 24, 2024 12:06 PM Martinique E wrote: Medication: HYDROcodone -acetaminophen  (NORCO/VICODIN) 5-325 MG tablet  Has the patient contacted their pharmacy? No (Agent: If no, request that the patient contact the pharmacy for the refill. If patient does not wish to contact the pharmacy document the reason why and proceed with request.) (Agent: If yes, when and what did the pharmacy advise?)  This is the patient's preferred pharmacy:  [facility] - [place], [place] - [address] [place] [address] Phone: [phone] Fax: [phone]  Is this the correct pharmacy for this prescription? Yes If no, delete pharmacy and type the correct one.   Has the prescription been filled recently? No  Is the patient out of the medication? Yes  Has the patient been seen for an appointment in the last year OR does the patient have an upcoming appointment? Yes  Can we respond through MyChart? No  Agent: Please be advised that Rx refills may take up to 3 business days. We ask that you follow-up with your pharmacy.

## 2024-04-25 MED ORDER — HYDROCODONE-ACETAMINOPHEN 5-325 MG PO TABS: 1.0000 | ORAL_TABLET | Freq: Two times a day (BID) | ORAL | 0 refills | Status: DC | PRN

## 2024-05-19 ENCOUNTER — Other Ambulatory Visit: Payer: Self-pay | Admitting: Internal Medicine

## 2024-05-19 NOTE — Telephone Encounter (Unsigned)
 Copied from CRM [id number]. Topic: Clinical - Medication Refill >> May 19, 2024 10:52 AM Henretta I wrote: Medication: HYDROcodone -acetaminophen  (NORCO/VICODIN) 5-325 MG tablet  Has the patient contacted their pharmacy? No (Agent: If no, request that the patient contact the pharmacy for the refill. If patient does not wish to contact the pharmacy document the reason why and proceed with request.) (Agent: If yes, when and what did the pharmacy advise?)  This is the patient's preferred pharmacy:  [facility] - [place], [place] - [address] [place] [address] Phone: [phone] Fax: [phone]  Is this the correct pharmacy for this prescription? Yes If no, delete pharmacy and type the correct one.   Has the prescription been filled recently? No  Is the patient out of the medication? Yes  Has the patient been seen for an appointment in the last year OR does the patient have an upcoming appointment? Yes  Can we respond through MyChart? No  Agent: Please be advised that Rx refills may take up to 3 business days. We ask that you follow-up with your pharmacy.

## 2024-05-21 ENCOUNTER — Ambulatory Visit: Admitting: Internal Medicine

## 2024-05-22 MED ORDER — HYDROCODONE-ACETAMINOPHEN 5-325 MG PO TABS: 1.0000 | ORAL_TABLET | Freq: Two times a day (BID) | ORAL | 0 refills | Status: DC | PRN

## 2024-05-26 ENCOUNTER — Other Ambulatory Visit: Payer: Self-pay

## 2024-05-26 MED ORDER — POTASSIUM CHLORIDE CRYS ER 20 MEQ PO TBCR: 20.0000 meq | EXTENDED_RELEASE_TABLET | Freq: Every day | ORAL | 0 refills | Status: DC

## 2024-06-10 ENCOUNTER — Telehealth: Payer: Self-pay

## 2024-06-10 NOTE — Telephone Encounter (Signed)
 Copied from CRM #[id number]. Topic: General - Other >> Jun 09, 2024  5:59 PM Drema MATSU wrote: Reason for CRM: Amita calling from [facility] is calling to see if we received the fax for Statin Drug therapy (Hedis). It was fax on 01/08/24, 02/12/24, and 06/09/24. She stated that we can fax information back by fax [phone] or Callback is [phone].

## 2024-06-12 ENCOUNTER — Ambulatory Visit: Admitting: Internal Medicine

## 2024-06-21 ENCOUNTER — Other Ambulatory Visit: Payer: Self-pay | Admitting: Internal Medicine

## 2024-06-23 ENCOUNTER — Other Ambulatory Visit: Payer: Self-pay | Admitting: Internal Medicine

## 2024-06-23 NOTE — Telephone Encounter (Unsigned)
 Copied from CRM [id number]. Topic: Clinical - Medication Refill >> Jun 23, 2024 10:56 AM Rosina BIRCH wrote: Medication: HYDROcodone -acetaminophen  and penicillin  v potassium  Has the patient contacted their pharmacy? Yes (Agent: If no, request that the patient contact the pharmacy for the refill. If patient does not wish to contact the pharmacy document the reason why and proceed with request.) (Agent: If yes, when and what did the pharmacy advise?)  This is the patient's preferred pharmacy:  [facility] - [place], [place] - [address] [place] [address] Phone: [phone] Fax: [phone]  Is this the correct pharmacy for this prescription? Yes If no, delete pharmacy and type the correct one.   Has the prescription been filled recently? Yes  Is the patient out of the medication? Yes  Has the patient been seen for an appointment in the last year OR does the patient have an upcoming appointment? Yes  Can we respond through MyChart? No  Agent: Please be advised that Rx refills may take up to 3 business days. We ask that you follow-up with your pharmacy.

## 2024-06-24 ENCOUNTER — Other Ambulatory Visit: Payer: Self-pay | Admitting: Internal Medicine

## 2024-06-26 ENCOUNTER — Telehealth: Payer: Self-pay

## 2024-06-26 NOTE — Telephone Encounter (Signed)
 Copied from CRM [id number]. Topic: Clinical - Medication Question >> Jun 25, 2024  5:26 PM Timothy Schmidt HERO wrote: Reason for CRM: HYDROcodone -acetaminophen  (NORCO/VICODIN) 5-325 MG tablet  Patient calling for refill update of the above medication.

## 2024-06-26 NOTE — Telephone Encounter (Signed)
 Copied from CRM #[id number]. Topic: Clinical - Prescription Issue >> Jun 26, 2024  2:22 PM Jasmin G wrote: Reason for CRM: Pt requested a call back at [phone] to discuss why her HYDROcodone -acetaminophen  hasn't been refilled as it's still pending.

## 2024-06-27 ENCOUNTER — Telehealth: Payer: Self-pay

## 2024-06-27 MED ORDER — HYDROCODONE-ACETAMINOPHEN 5-325 MG PO TABS: 1.0000 | ORAL_TABLET | Freq: Two times a day (BID) | ORAL | 0 refills | Status: DC | PRN

## 2024-06-27 MED ORDER — PENICILLIN V POTASSIUM 500 MG PO TABS: 500.0000 mg | ORAL_TABLET | Freq: Four times a day (QID) | ORAL | 0 refills | Status: AC

## 2024-06-27 NOTE — Telephone Encounter (Signed)
 Copied from CRM [id number]. Topic: Clinical - Medical Advice >> Jun 27, 2024  8:31 AM Carlyon D wrote: Reason for CRM: Pt is calling and would like pcp nurse to give him a call back to discuss his upcoming appt and some medical stuff pt did not want to disclose any further info with me just kept telling me to have the nurse call him. Please reach out to pt

## 2024-06-27 NOTE — Telephone Encounter (Signed)
 Copied from CRM [id number]. Topic: Clinical - Prescription Issue >> Jun 27, 2024  9:59 AM Alfonso HERO wrote: Reason for CRM: patient calling again for refill status of HYDROcodone -acetaminophen  (NORCO/VICODIN) 5-325 MG tablet

## 2024-06-30 NOTE — Telephone Encounter (Signed)
It has been done. Thanks

## 2024-06-30 NOTE — Telephone Encounter (Signed)
 Refilled on 06/27/2024.  Thanks

## 2024-07-08 ENCOUNTER — Ambulatory Visit: Admitting: Internal Medicine

## 2024-07-15 ENCOUNTER — Encounter: Payer: Self-pay | Admitting: Internal Medicine

## 2024-07-15 ENCOUNTER — Ambulatory Visit (INDEPENDENT_AMBULATORY_CARE_PROVIDER_SITE_OTHER): Admitting: Internal Medicine

## 2024-07-15 VITALS — BP 128/60 | HR 97 | Ht 68.0 in | Wt 203.4 lb

## 2024-07-15 DIAGNOSIS — E785 Hyperlipidemia, unspecified: Secondary | ICD-10-CM | POA: Diagnosis not present

## 2024-07-15 DIAGNOSIS — M5440 Lumbago with sciatica, unspecified side: Secondary | ICD-10-CM | POA: Diagnosis not present

## 2024-07-15 DIAGNOSIS — I10 Essential (primary) hypertension: Secondary | ICD-10-CM | POA: Diagnosis not present

## 2024-07-15 DIAGNOSIS — F4321 Adjustment disorder with depressed mood: Secondary | ICD-10-CM | POA: Diagnosis not present

## 2024-07-15 DIAGNOSIS — Z7984 Long term (current) use of oral hypoglycemic drugs: Secondary | ICD-10-CM

## 2024-07-15 DIAGNOSIS — E1142 Type 2 diabetes mellitus with diabetic polyneuropathy: Secondary | ICD-10-CM | POA: Diagnosis not present

## 2024-07-15 DIAGNOSIS — E538 Deficiency of other specified B group vitamins: Secondary | ICD-10-CM

## 2024-07-15 DIAGNOSIS — N2889 Other specified disorders of kidney and ureter: Secondary | ICD-10-CM | POA: Diagnosis not present

## 2024-07-15 DIAGNOSIS — G8929 Other chronic pain: Secondary | ICD-10-CM

## 2024-07-15 LAB — HEMOGLOBIN A1C: Hgb A1c MFr Bld: 6.1 % (ref 4.6–6.5)

## 2024-07-15 LAB — COMPREHENSIVE METABOLIC PANEL WITH GFR
ALT: 9 U/L (ref 0–53)
AST: 14 U/L (ref 0–37)
Albumin: 4.3 g/dL (ref 3.5–5.2)
Alkaline Phosphatase: 46 U/L (ref 39–117)
BUN: 15 mg/dL (ref 6–23)
CO2: 27 meq/L (ref 19–32)
Calcium: 9.9 mg/dL (ref 8.4–10.5)
Chloride: 107 meq/L (ref 96–112)
Creatinine, Ser: 1.47 mg/dL (ref 0.40–1.50)
GFR: 48.79 mL/min — ABNORMAL LOW (ref >60.00)
Glucose, Bld: 66 mg/dL — ABNORMAL LOW (ref 70–99)
Potassium: 3.8 meq/L (ref 3.5–5.1)
Sodium: 142 meq/L (ref 135–145)
Total Bilirubin: 0.3 mg/dL (ref 0.2–1.2)
Total Protein: 7.3 g/dL (ref 6.0–8.3)

## 2024-07-15 MED ORDER — TRIAMTERENE-HCTZ 37.5-25 MG PO TABS: 1.0000 | ORAL_TABLET | Freq: Every day | ORAL | 3 refills | Status: AC

## 2024-07-15 NOTE — Assessment & Plan Note (Addendum)
 Cont w/Metformin , Glipizide , Actos  Risks associated with treatment and diet noncompliance were discussed. Compliance was encouraged. Check A1c q 3 months [facility] declined statins Take fish oil

## 2024-07-15 NOTE — Assessment & Plan Note (Signed)
Hydrate better. Monitor GFR

## 2024-07-15 NOTE — Assessment & Plan Note (Signed)
Continue w/Norco prn  Potential benefits of a long term prn opioids use as well as potential risks (i.e. addiction risk, apnea etc) and complications (i.e. Somnolence, constipation and others) were explained to the patient and were aknowledged. 

## 2024-07-15 NOTE — Assessment & Plan Note (Signed)
Grieving discussed  

## 2024-07-15 NOTE — Assessment & Plan Note (Signed)
Chronic ?Cont on Triamt/HCT ?

## 2024-07-15 NOTE — Assessment & Plan Note (Signed)
 On B12

## 2024-07-15 NOTE — Progress Notes (Signed)
 Subjective:  Patient ID: Timothy Schmidt, male    DOB: [birth year]  Age: 68 y.o. MRN: [id number]  CC: Medical Management of Chronic Issues (3 Month follow up)   HPI Timothy Schmidt presents for HTN, DM, dyslipidemia  Outpatient Medications Prior to Visit  Medication Sig Dispense Refill   Alcohol Swabs  (B-D SINGLE USE SWABS  BUTTERFLY) PADS Use one two times daily.Dx: 250.00 100 each 3   Ascorbic Acid (VITAMIN C WITH ROSE HIPS) 1000 MG tablet Take 1,000 mg by mouth daily.     aspirin EC 81 MG tablet Take 81 mg by mouth daily.     Blood Glucose Calibration (PRODIGY CONTROL SOLUTION) LOW SOLN Use as directed per meter instructions. 3 each 0   Blood Glucose Monitoring Suppl (PRODIGY AUTOCODE BLOOD GLUCOSE) W/DEVICE KIT Use two times daily as directed. 1 each 0   cholecalciferol (VITAMIN D ) 1000 UNITS tablet Take 1 tablet (1,000 Units total) by mouth daily. 90 tablet 3   cyanocobalamin  1000 MCG tablet Take 0.5 tablets (500 mcg total) by mouth daily. 90 tablet 3   diazepam (VALIUM) 5 MG tablet Take by mouth.     glipiZIDE  (GLUCOTROL ) 5 MG tablet TAKE 1 TABLET BY MOUTH TWICE DAILY BEFORE MEAL(S) 180 tablet 0   glucose blood (PRODIGY AUTOCODE TEST) test strip Use one two times daily. Dx: 250.00 100 each 3   guaiFENesin-Codeine  (DIABETIC TUSSIN C PO) Take by mouth as needed.     HYDROcodone -acetaminophen  (NORCO/VICODIN) 5-325 MG tablet Take 1 tablet by mouth 2 (two) times daily as needed for severe pain (pain score 7-10). 60 tablet 0   metFORMIN  (GLUCOPHAGE ) 1000 MG tablet TAKE 1 TABLET BY MOUTH TWICE DAILY WITH MEALS 180 tablet 3   Misc Natural Products ([employer] PROSTATE MAX +) TABS Take 1 tablet by mouth 2 (two) times daily.     penicillin  v potassium (VEETID) 500 MG tablet Take 1 tablet (500 mg total) by mouth 4 (four) times daily. 120 tablet 0   penicillin  v potassium (VEETID) 500 MG tablet Take 1 tablet by mouth 4 times daily 120 tablet 0   pioglitazone  (ACTOS ) 30 MG tablet Take 1 tablet by  mouth once daily 90 tablet 3   potassium chloride  SA (KLOR-CON  M20) 20 MEQ tablet Take 1 tablet (20 mEq total) by mouth daily. 90 tablet 0   PRODIGY TWIST TOP LANCETS 28G MISC Use one two times daily. Dx: 250.00 100 each 3   triamterene -hydrochlorothiazide (MAXZIDE-25) 37.5-25 MG tablet Take 1 tablet by mouth once daily 90 tablet 0   No facility-administered medications prior to visit.    ROS: Review of Systems  Constitutional:  Positive for unexpected weight change. Negative for appetite change and fatigue.  HENT:  Negative for congestion, nosebleeds, sneezing, sore throat and trouble swallowing.   Eyes:  Negative for itching and visual disturbance.  Respiratory:  Negative for cough.   Cardiovascular:  Negative for chest pain, palpitations and leg swelling.  Gastrointestinal:  Negative for abdominal distention, blood in stool, diarrhea and nausea.  Genitourinary:  Negative for frequency and hematuria.  Musculoskeletal:  Positive for back pain. Negative for gait problem, joint swelling and neck pain.  Skin:  Negative for rash.  Neurological:  Negative for dizziness, tremors, speech difficulty and weakness.  Psychiatric/Behavioral:  Negative for agitation, dysphoric mood, sleep disturbance and suicidal ideas. The patient is not nervous/anxious.     Objective:  BP 128/60   Pulse 97   Ht 5' 8 (1.727 m)   Wt 203  lb 6.4 oz (92.3 kg)   SpO2 99%   BMI 30.93 kg/m   BP Readings from Last 3 Encounters:  07/15/24 128/60  03/27/24 120/85  12/11/23 112/70    Wt Readings from Last 3 Encounters:  07/15/24 203 lb 6.4 oz (92.3 kg)  03/27/24 196 lb (88.9 kg)  12/11/23 206 lb 6.4 oz (93.6 kg)    Physical Exam Constitutional:      General: He is not in acute distress.    Appearance: He is well-developed. He is obese.     Comments: NAD  Eyes:     Conjunctiva/sclera: Conjunctivae normal.     Pupils: Pupils are equal, round, and reactive to light.  Neck:     Thyroid : No thyromegaly.      Vascular: No JVD.  Cardiovascular:     Rate and Rhythm: Normal rate and regular rhythm.     Heart sounds: Normal heart sounds. No murmur heard.    No friction rub. No gallop.  Pulmonary:     Effort: Pulmonary effort is normal. No respiratory distress.     Breath sounds: Normal breath sounds. No wheezing or rales.  Chest:     Chest wall: No tenderness.  Abdominal:     General: Bowel sounds are normal. There is no distension.     Palpations: Abdomen is soft. There is no mass.     Tenderness: There is no abdominal tenderness. There is no guarding or rebound.  Musculoskeletal:        General: Tenderness present. Normal range of motion.     Cervical back: Normal range of motion.     Right lower leg: No edema.     Left lower leg: No edema.  Lymphadenopathy:     Cervical: No cervical adenopathy.  Skin:    General: Skin is warm and dry.     Findings: No rash.  Neurological:     Mental Status: He is alert and oriented to person, place, and time.     Cranial Nerves: No cranial nerve deficit.     Motor: No abnormal muscle tone.     Coordination: Coordination normal.     Gait: Gait abnormal.     Deep Tendon Reflexes: Reflexes are normal and symmetric.  Psychiatric:        Behavior: Behavior normal.        Thought Content: Thought content normal.        Judgment: Judgment normal.    LS back w/pain  Lab Results  Component Value Date   WBC 7.6 03/27/2024   HGB 13.9 03/27/2024   HCT 41.6 03/27/2024   PLT 301.0 03/27/2024   GLUCOSE 78 03/27/2024   CHOL 194 03/27/2024   TRIG 242.0 (H) 03/27/2024   HDL 45.00 03/27/2024   LDLDIRECT 59.0 03/17/2021   LDLCALC 101 (H) 03/27/2024   ALT 8 03/27/2024   AST 10 03/27/2024   NA 144 03/27/2024   K 4.1 03/27/2024   CL 102 03/27/2024   CREATININE 1.13 03/27/2024   BUN 11 03/27/2024   CO2 29 03/27/2024   TSH 1.24 03/27/2024   PSA 21.19 (H) 03/27/2024   HGBA1C 6.8 (H) 03/27/2024   MICROALBUR 0.7 03/27/2024    CT Renal Stone  Study Result Date: 02/21/2022 CLINICAL DATA:  Flank pain, kidney stone suspected. EXAM: CT ABDOMEN AND PELVIS WITHOUT CONTRAST TECHNIQUE: Multidetector CT imaging of the abdomen and pelvis was performed following the standard protocol without IV contrast. RADIATION DOSE REDUCTION: This exam was performed according to the departmental  dose-optimization program which includes automated exposure control, adjustment of the mA and/or kV according to patient size and/or use of iterative reconstruction technique. COMPARISON:  None Available. FINDINGS: Lower chest: The heart is normal in size and there is no pericardial effusion. Multi-vessel coronary artery calcifications are noted. The lung bases are clear. Hepatobiliary: No focal liver abnormality is seen. No gallstones, gallbladder wall thickening, or biliary dilatation. Pancreas: Unremarkable. No pancreatic ductal dilatation or surrounding inflammatory changes. Spleen: Normal in size without focal abnormality. Adrenals/Urinary Tract: No adrenal nodule or mass. A nonobstructive calculus is present in the mid right kidney measuring 4 mm. No ureteral calculus hydroureteronephrosis bilaterally. Multiple small stones are present in the urinary bladder bilaterally. Stomach/Bowel: Stomach is within normal limits. Appendix appears normal. No evidence of bowel wall thickening, distention, or inflammatory changes. No free air or pneumatosis. A few scattered diverticula are present along the colon without evidence of diverticulitis. Vascular/Lymphatic: Aortic atherosclerosis. No enlarged abdominal or pelvic lymph nodes. Reproductive: The prostate gland is enlarged. Other: A fat containing periumbilical hernia is noted. Fat containing inguinal hernias are present bilaterally. No abdominopelvic ascites. Musculoskeletal: Degenerative changes are present in the thoracolumbar spine. No acute osseous abnormality. IMPRESSION: 1. Nonobstructive right renal calculus. No ureteral calculus  or obstructive uropathy bilaterally. 2. Multiple tiny bladder calculi bilaterally. 3. Enlarged prostate gland. 4. Coronary artery calcifications. 5. Aortic atherosclerosis. Electronically Signed   By: Leita Birmingham M.D.   On: 02/21/2022 23:57    Assessment & Plan:   Problem List Items Addressed This Visit     B12 deficiency   On B12      CRI (chronic renal insufficiency), stage 3 (moderate)   Hydrate better Monitor GFR      Dyslipidemia   Chronic Timothy declined statins Take fish oil      Essential hypertension   Chronic Cont on Triamt/HCT      Relevant Medications   triamterene -hydrochlorothiazide (MAXZIDE-25) 37.5-25 MG tablet   Other Relevant Orders   Comprehensive metabolic panel with GFR   LOW BACK PAIN   Continue w/Norco prn  Potential benefits of a long term prn opioids use as well as potential risks (i.e. addiction risk, apnea etc) and complications (i.e. Somnolence, constipation and others) were explained to the patient and were aknowledged.      Situational depression   Grieving - discussed      Type 2 diabetes mellitus (HCC) - Primary   Cont w/Metformin , Glipizide , Actos  Risks associated with treatment and diet noncompliance were discussed. Compliance was encouraged. Check A1c q 3 months Alvan declined statins Take fish oil      Relevant Orders   Comprehensive metabolic panel with GFR   Hemoglobin A1c      Meds ordered this encounter  Medications   triamterene -hydrochlorothiazide (MAXZIDE-25) 37.5-25 MG tablet    Sig: Take 1 tablet by mouth daily.    Dispense:  90 tablet    Refill:  3      Follow-up: Return in about 3 months (around 10/15/2024) for a follow-up visit.  Marolyn Noel, MD

## 2024-07-15 NOTE — Assessment & Plan Note (Signed)
 Chronic Timothy Schmidt declined statins Take fish oil

## 2024-07-16 ENCOUNTER — Telehealth: Payer: Self-pay | Admitting: Internal Medicine

## 2024-07-16 NOTE — Telephone Encounter (Signed)
 Copied from CRM [id number]. Topic: General - Call Back - No Documentation >> Jul 16, 2024 11:55 AM Rea BROCKS wrote: Reason for CRM: Patient called in and would like Dr. Garald to know that he only works 5 hours a week, and he hopes that answers Dr. Alexandra question from yesterday's appointment. And he wishes him a Happy Thanksgiving.  [phone] (H)

## 2024-07-21 ENCOUNTER — Other Ambulatory Visit: Payer: Self-pay | Admitting: Internal Medicine

## 2024-07-28 ENCOUNTER — Ambulatory Visit: Payer: Self-pay | Admitting: Internal Medicine

## 2024-07-30 ENCOUNTER — Other Ambulatory Visit: Payer: Self-pay | Admitting: Internal Medicine

## 2024-07-30 NOTE — Telephone Encounter (Unsigned)
 Copied from CRM #[id number]. Topic: Clinical - Medication Refill >> Jul 30, 2024  9:30 AM Montie POUR wrote: Medication:  HYDROcodone -acetaminophen  (NORCO/VICODIN) 5-325 MG tablet   Has the patient contacted their pharmacy? Yes (Agent: If no, request that the patient contact the pharmacy for the refill. If patient does not wish to contact the pharmacy document the reason why and proceed with request.) (Agent: If yes, when and what did the pharmacy advise?) Pharmacy needs order to refill   This is the patient's preferred pharmacy:  [id number] - [place], [place] - [address] [place] [address] Phone: [phone] Fax: [phone]  Is this the correct pharmacy for this prescription? Yes If no, delete pharmacy and type the correct one.   Has the prescription been filled recently? No  Is the patient out of the medication? No He has 4 left  Has the patient been seen for an appointment in the last year OR does the patient have an upcoming appointment? Yes  Can we respond through MyChart? No  Agent: Please be advised that Rx refills may take up to 3 business days. We ask that you follow-up with your pharmacy.

## 2024-07-31 MED ORDER — HYDROCODONE-ACETAMINOPHEN 5-325 MG PO TABS: 1.0000 | ORAL_TABLET | Freq: Two times a day (BID) | ORAL | 0 refills | Status: DC | PRN

## 2024-08-01 ENCOUNTER — Ambulatory Visit

## 2024-08-01 VITALS — Ht 67.5 in | Wt 203.0 lb

## 2024-08-01 DIAGNOSIS — Z1159 Encounter for screening for other viral diseases: Secondary | ICD-10-CM | POA: Diagnosis not present

## 2024-08-01 DIAGNOSIS — Z0000 Encounter for general adult medical examination without abnormal findings: Secondary | ICD-10-CM | POA: Diagnosis not present

## 2024-08-01 DIAGNOSIS — Z1211 Encounter for screening for malignant neoplasm of colon: Secondary | ICD-10-CM

## 2024-08-01 NOTE — Progress Notes (Signed)
 Chief Complaint  Patient presents with   Medicare Wellness     Subjective:  Please attest and cosign this visit due to patients primary care provider not being in the office at the time the visit was completed.  (Pt of Dr A. Plotnikov)   Timothy Schmidt is a 68 y.o. male who presents for a Medicare Annual Wellness Visit.  Visit info / Clinical Intake: Medicare Wellness Visit Type:: Subsequent Annual Wellness Visit Persons participating in visit and providing information:: patient Medicare Wellness Visit Mode:: Telephone If telephone:: video declined Since this visit was completed virtually, some vitals may be partially provided or unavailable. Missing vitals are due to the limitations of the virtual format.: Documented vitals are patient reported If Telephone or Video please confirm:: I connected with patient using audio/video enable telemedicine. I verified patient identity with two identifiers, discussed telehealth limitations, and patient agreed to proceed. Patient Location:: Home Provider Location:: Office Interpreter Needed?: No Pre-visit prep was completed: yes AWV questionnaire completed by patient prior to visit?: no Living arrangements:: (!) lives alone Patient's Overall Health Status Rating: good Typical amount of pain: some (pain in legs/back/neck - rating at 6) Does pain affect daily life?: (!) yes Are you currently prescribed opioids?: (!) yes  Dietary Habits and Nutritional Risks How many meals a day?: 4 Eats fruit and vegetables daily?: yes Most meals are obtained by: preparing own meals; eating out In the last 2 weeks, have you had any of the following?: none Diabetic:: (!) yes Any non-healing wounds?: no How often do you check your BS?: 0 Would you like to be referred to a Nutritionist or for Diabetic Management? : no  Functional Status Activities of Daily Living (to include ambulation/medication): Independent Ambulation: Independent with device- listed  below Home Assistive Devices/Equipment: Cane Medication Administration: Independent Home Management (perform basic housework or laundry): Independent Manage your own finances?: yes Primary transportation is: driving Concerns about vision?: no *vision screening is required for WTM* Concerns about hearing?: no  Fall Screening Falls in the past year?: 0 Number of falls in past year: 0 Was there an injury with Fall?: 0 Fall Risk Category Calculator: 0 Patient Fall Risk Level: Low Fall Risk  Fall Risk Patient at Risk for Falls Due to: No Fall Risks; Impaired balance/gait Fall risk Follow up: Falls evaluation completed; Falls prevention discussed  Home and Transportation Safety: All rugs have non-skid backing?: N/A, no rugs All stairs or steps have railings?: yes Grab bars in the bathtub or shower?: (!) no Have non-skid surface in bathtub or shower?: yes Good home lighting?: yes Regular seat belt use?: yes Hospital stays in the last year:: no  Cognitive Assessment Difficulty concentrating, remembering, or making decisions? : no Will 6CIT or Mini Cog be Completed: yes What year is it?: 0 points What month is it?: 0 points Give patient an address phrase to remember (5 components): [address] [place] About what time is it?: 0 points Count backwards from 20 to 1: 4 points (unable to count backwards on own) Say the months of the year in reverse: 4 points (unable to say months in reverse) Repeat the address phrase from earlier: 0 points 6 CIT Score: 8 points  Advance Directives (For Healthcare) Does Patient Have a Medical Advance Directive?: No Would patient like information on creating a medical advance directive?: No - Patient declined  Reviewed/Updated  Reviewed/Updated: Reviewed All (Medical, Surgical, Family, Medications, Allergies, Care Teams, Patient Goals)    Allergies (verified) Patient has no  known allergies.   Current Medications (verified) Outpatient  Encounter Medications as of 08/01/2024  Medication Sig   Alcohol Swabs  (B-D SINGLE USE SWABS  BUTTERFLY) PADS Use one two times daily.Dx: 250.00   Ascorbic Acid (VITAMIN C WITH ROSE HIPS) 1000 MG tablet Take 1,000 mg by mouth daily.   aspirin EC 81 MG tablet Take 81 mg by mouth daily.   Blood Glucose Calibration (PRODIGY CONTROL SOLUTION) LOW SOLN Use as directed per meter instructions.   Blood Glucose Monitoring Suppl (PRODIGY AUTOCODE BLOOD GLUCOSE) W/DEVICE KIT Use two times daily as directed.   cholecalciferol (VITAMIN D ) 1000 UNITS tablet Take 1 tablet (1,000 Units total) by mouth daily.   cyanocobalamin  1000 MCG tablet Take 0.5 tablets (500 mcg total) by mouth daily.   diazepam (VALIUM) 5 MG tablet Take by mouth.   glipiZIDE  (GLUCOTROL ) 5 MG tablet TAKE 1 TABLET BY MOUTH TWICE DAILY BEFORE MEAL(S)   glucose blood (PRODIGY AUTOCODE TEST) test strip Use one two times daily. Dx: 250.00   guaiFENesin-Codeine  (DIABETIC TUSSIN C PO) Take by mouth as needed.   HYDROcodone -acetaminophen  (NORCO/VICODIN) 5-325 MG tablet Take 1 tablet by mouth 2 (two) times daily as needed for severe pain (pain score 7-10).   metFORMIN  (GLUCOPHAGE ) 1000 MG tablet TAKE 1 TABLET BY MOUTH TWICE DAILY WITH MEALS   Misc Natural Products (CVS PROSTATE MAX +) TABS Take 1 tablet by mouth 2 (two) times daily.   penicillin  v potassium (VEETID) 500 MG tablet Take 1 tablet (500 mg total) by mouth 4 (four) times daily.   penicillin  v potassium (VEETID) 500 MG tablet Take 1 tablet by mouth 4 times daily   pioglitazone  (ACTOS ) 30 MG tablet Take 1 tablet by mouth once daily   potassium chloride  SA (KLOR-CON  M) 20 MEQ tablet Take 1 tablet by mouth once daily   PRODIGY TWIST TOP LANCETS 28G MISC Use one two times daily. Dx: 250.00   triamterene -hydrochlorothiazide (MAXZIDE-25) 37.5-25 MG tablet Take 1 tablet by mouth daily.   No facility-administered encounter medications on file as of 08/01/2024.    History: Past Medical  History:  Diagnosis Date   Anxiety    BPH (benign prostatic hypertrophy)    Caries    Depression    GERD (gastroesophageal reflux disease)    Hernia    right   Hypertension    LBP (low back pain)    OA (osteoarthritis)    Type II or unspecified type diabetes mellitus without mention of complication, not stated as uncontrolled    Vitamin B 12 deficiency 2009   Past Surgical History:  Procedure Laterality Date   HERNIA REPAIR     Family History  Problem Relation Age of Onset   Hypertension Other    Stroke Other    Dementia Other    Mental illness Mother        dementia   Cancer Mother        pancreatic   Hypertension Father    Social History   Occupational History   Not on file  Tobacco Use   Smoking status: Every Day   Smokeless tobacco: Current   Tobacco comments:    tobacco  Substance and Sexual Activity   Alcohol use: No   Drug use: No   Sexual activity: Not Currently   Tobacco Counseling Ready to quit: No Counseling given: Yes Tobacco comments: tobacco  SDOH Screenings   Food Insecurity: No Food Insecurity (08/01/2024)  Housing: Unknown (08/01/2024)  Transportation Needs: No Transportation Needs (08/01/2024)  Utilities:  Not At Risk (08/01/2024)  Depression (PHQ2-9): Low Risk (08/01/2024)  Physical Activity: Inactive (08/01/2024)  Social Connections: Moderately Integrated (08/01/2024)  Stress: No Stress Concern Present (08/01/2024)  Tobacco Use: High Risk (08/01/2024)  Health Literacy: Adequate Health Literacy (08/01/2024)   See flowsheets for full screening details  Depression Screen PHQ 2 & 9 Depression Scale- Over the past 2 weeks, how often have you been bothered by any of the following problems? Little interest or pleasure in doing things: 0 Feeling down, depressed, or hopeless (PHQ Adolescent also includes...irritable): 0 PHQ-2 Total Score: 0 Trouble falling or staying asleep, or sleeping too much: 0 Feeling tired or having little energy:  0 Poor appetite or overeating (PHQ Adolescent also includes...weight loss): 0 Feeling bad about yourself - or that you are a failure or have let yourself or your family down: 0 Trouble concentrating on things, such as reading the newspaper or watching television (PHQ Adolescent also includes...like school work): 0 Moving or speaking so slowly that other people could have noticed. Or the opposite - being so fidgety or restless that you have been moving around a lot more than usual: 3 (back/neck/legs) Thoughts that you would be better off dead, or of hurting yourself in some way: 0 PHQ-9 Total Score: 3 If you checked off any problems, how difficult have these problems made it for you to do your work, take care of things at home, or get along with other people?: Very difficult  Depression Treatment Depression Interventions/Treatment : Medication; Currently on Treatment; PHQ2-9 Score <4 Follow-up Not Indicated     Goals Addressed               This Visit's Progress     Patient Stated (pt-stated)        Patient stated he plans to continue managing pain (neck/back/legs) and living             Objective:    Today's Vitals   08/01/24 1429  Weight: 203 lb (92.1 kg)  Height: 5' 7.5 (1.715 m)   Body mass index is 31.33 kg/m.  Hearing/Vision screen Hearing Screening - Comments:: Denies hearing difficulties   Vision Screening - Comments:: Denies vision concerns Immunizations and Health Maintenance Health Maintenance  Topic Date Due   OPHTHALMOLOGY EXAM  Never done   Hepatitis C Screening  Never done   Pneumococcal Vaccine: 50+ Years (1 of 2 - PCV) Never done   FOOT EXAM  04/14/2017   Fecal DNA (Cologuard)  01/31/2021   Influenza Vaccine  03/21/2024   HEMOGLOBIN A1C  01/12/2025   Diabetic kidney evaluation - Urine ACR  03/27/2025   Diabetic kidney evaluation - eGFR measurement  07/15/2025   Medicare Annual Wellness (AWV)  08/01/2025   DTaP/Tdap/Td (2 - Td or Tdap)  12/29/2031   Meningococcal B Vaccine  Aged Out   COVID-19 Vaccine  Discontinued   Zoster Vaccines- Shingrix  Discontinued        Assessment/Plan:  This is a routine wellness examination for Timothy Schmidt.  Hepatitis C Screening - ordered today Cologuard test - ordered today  Patient Care Team: Plotnikov, Karlynn GAILS, MD as PCP - General McNatt, Elspeth, MD as Physician Assistant (Surgery)  I have personally reviewed and noted the following in the patients chart:   Medical and social history Use of alcohol, tobacco or illicit drugs  Current medications and supplements including opioid prescriptions. Functional ability and status Nutritional status Physical activity Advanced directives List of other physicians Hospitalizations, surgeries, and ER visits in previous  12 months Vitals Screenings to include cognitive, depression, and falls Referrals and appointments  Orders Placed This Encounter  Procedures   Cologuard   Hepatitis C antibody    Standing Status:   Future    Expiration Date:   08/01/2025   In addition, I have reviewed and discussed with patient certain preventive protocols, quality metrics, and best practice recommendations. A written personalized care plan for preventive services as well as general preventive health recommendations were provided to patient.   Timothy Schmidt CHRISTELLA Saba, CMA   08/01/2024   Return in 1 year (on 08/01/2025).  After Visit Summary: (Declined) Due to this being a telephonic visit, with patients personalized plan was offered to patient but patient Declined AVS at this time   Nurse Notes: Appointment(s) made: (scheduled 2026 AWV/CPE appts)

## 2024-08-01 NOTE — Patient Instructions (Addendum)
 Timothy Schmidt,  Thank you for taking the time for your Medicare Wellness Visit. I appreciate your continued commitment to your health goals. Please review the care plan we discussed, and feel free to reach out if I can assist you further.  Please note that Annual Wellness Visits do not include a physical exam. Some assessments may be limited, especially if the visit was conducted virtually. If needed, we may recommend an in-person follow-up with your provider.  Ongoing Care Seeing your primary care provider every 3 to 6 months helps us  monitor your health and provide consistent, personalized care.   Referrals If a referral was made during today's visit and you haven't received any updates within two weeks, please contact the referred provider directly to check on the status.  Recommended Screenings:  Health Maintenance  Topic Date Due   Eye exam for diabetics  Never done   Hepatitis C Screening  Never done   Pneumococcal Vaccine for age over 20 (1 of 2 - PCV) Never done   Complete foot exam   04/14/2017   Cologuard (Stool DNA test)  01/31/2021   Flu Shot  03/21/2024   Hemoglobin A1C  01/12/2025   Yearly kidney health urinalysis for diabetes  03/27/2025   Yearly kidney function blood test for diabetes  07/15/2025   Medicare Annual Wellness Visit  08/01/2025   DTaP/Tdap/Td vaccine (2 - Td or Tdap) 12/29/2031   Meningitis B Vaccine  Aged Out   COVID-19 Vaccine  Discontinued   Zoster (Shingles) Vaccine  Discontinued       08/01/2024    2:32 PM  Advanced Directives  Does Patient Have a Medical Advance Directive? No  Would patient like information on creating a medical advance directive? No - Patient declined    Vision: Annual vision screenings are recommended for early detection of glaucoma, cataracts, and diabetic retinopathy. These exams can also reveal signs of chronic conditions such as diabetes and high blood pressure.  Dental: Annual dental screenings help detect early signs  of oral cancer, gum disease, and other conditions linked to overall health, including heart disease and diabetes.  Managing Pain Without Opioids Opioids are strong medicines used to treat moderate to severe pain. For some people, especially those who have long-term (chronic) pain, opioids may not be the best choice for pain management due to: Side effects like nausea, constipation, and sleepiness. The risk of addiction (opioid use disorder). The longer you take opioids, the greater your risk of addiction. Pain that lasts for more than 3 months is called chronic pain. Managing chronic pain usually requires more than one approach and is often provided by a team of health care providers working together (multidisciplinary approach). Pain management may be done at a pain management center or pain clinic. How to manage pain without the use of opioids Use non-opioid medicines Non-opioid medicines for pain may include: Over-the-counter or prescription non-steroidal anti-inflammatory drugs (NSAIDs). These may be the first medicines used for pain. They work well for muscle and bone pain, and they reduce swelling. Acetaminophen . This over-the-counter medicine may work well for milder pain but not swelling. Antidepressants. These may be used to treat chronic pain. A certain type of antidepressant (tricyclics) is often used. These medicines are given in lower doses for pain than when used for depression. Anticonvulsants. These are usually used to treat seizures but may also reduce nerve (neuropathic) pain. Muscle relaxants. These relieve pain caused by sudden muscle tightening (spasms). You may also use a pain medicine that is  applied to the skin as a patch, cream, or gel (topical analgesic), such as a numbing medicine. These may cause fewer side effects than medicines taken by mouth. Do certain therapies as directed Some therapies can help with pain management. They include: Physical therapy. You will do  exercises to gain strength and flexibility. A physical therapist may teach you exercises to move and stretch parts of your body that are weak, stiff, or painful. You can learn these exercises at physical therapy visits and practice them at home. Physical therapy may also involve: Massage. Heat wraps or applying heat or cold to affected areas. Electrical signals that interrupt pain signals (transcutaneous electrical nerve stimulation, TENS). Weak lasers that reduce pain and swelling (low-level laser therapy). Signals from your body that help you learn to regulate pain (biofeedback). Occupational therapy. This helps you to learn ways to function at home and work with less pain. Recreational therapy. This involves trying new activities or hobbies, such as a physical activity or drawing. Mental health therapy, including: Cognitive behavioral therapy (CBT). This helps you learn coping skills for dealing with pain. Acceptance and commitment therapy (ACT) to change the way you think and react to pain. Relaxation therapies, including muscle relaxation exercises and mindfulness-based stress reduction. Pain management counseling. This may be individual, family, or group counseling.  Receive medical treatments Medical treatments for pain management include: Nerve block injections. These may include a pain blocker and anti-inflammatory medicines. You may have injections: Near the spine to relieve chronic back or neck pain. Into joints to relieve back or joint pain. Into nerve areas that supply a painful area to relieve body pain. Into muscles (trigger point injections) to relieve some painful muscle conditions. A medical device placed near your spine to help block pain signals and relieve nerve pain or chronic back pain (spinal cord stimulation device). Acupuncture. Follow these instructions at home Medicines Take over-the-counter and prescription medicines only as told by your health care provider. If  you are taking pain medicine, ask your health care providers about possible side effects to watch out for. Do not drive or use heavy machinery while taking prescription opioid pain medicine. Lifestyle  Do not use drugs or alcohol to reduce pain. If you drink alcohol, limit how much you have to: 0-1 drink a day for women who are not pregnant. 0-2 drinks a day for men. Know how much alcohol is in a drink. In the [place] one drink equals one 12 oz bottle of beer (355 mL), one 5 oz glass of wine (148 mL), or one 1 oz glass of hard liquor (44 mL). Do not use any products that contain nicotine or tobacco. These products include cigarettes, chewing tobacco, and vaping devices, such as e-cigarettes. If you need help quitting, ask your health care provider. Eat a healthy diet and maintain a healthy weight. Poor diet and excess weight may make pain worse. Eat foods that are high in fiber. These include fresh fruits and vegetables, whole grains, and beans. Limit foods that are high in fat and processed sugars, such as fried and sweet foods. Exercise regularly. Exercise lowers stress and may help relieve pain. Ask your health care provider what activities and exercises are safe for you. If your health care provider approves, join an exercise class that combines movement and stress reduction. Examples include yoga and tai chi. Get enough sleep. Lack of sleep may make pain worse. Lower stress as much as possible. Practice stress reduction techniques as told by your therapist. General  instructions Work with all your pain management providers to find the treatments that work best for you. You are an important member of your pain management team. There are many things you can do to reduce pain on your own. Consider joining an online or in-person support group for people who have chronic pain. Keep all follow-up visits. This is important. Where to find more information You can find more information about managing  pain without opioids from: American Academy of Pain Medicine: painmed.org Institute for Chronic Pain: instituteforchronicpain.org American Chronic Pain Association: theacpa.org Contact a health care provider if: You have side effects from pain medicine. Your pain gets worse or does not get better with treatments or home therapy. You are struggling with anxiety or depression. Summary Many types of pain can be managed without opioids. Chronic pain may respond better to pain management without opioids. Pain is best managed when you and a team of health care providers work together. Pain management without opioids may include non-opioid medicines, medical treatments, physical therapy, mental health therapy, and lifestyle changes. Tell your health care providers if your pain gets worse or is not being managed well enough. This information is not intended to replace advice given to you by your health care provider. Make sure you discuss any questions you have with your health care provider. Document Revised: 11/17/2020 Document Reviewed: 11/17/2020 Elsevier Patient Education  2024 [employer].

## 2024-09-01 ENCOUNTER — Other Ambulatory Visit: Payer: Self-pay | Admitting: Internal Medicine

## 2024-09-01 MED ORDER — HYDROCODONE-ACETAMINOPHEN 5-325 MG PO TABS: 1.0000 | ORAL_TABLET | Freq: Two times a day (BID) | ORAL | 0 refills | Status: AC | PRN

## 2024-09-01 NOTE — Telephone Encounter (Signed)
 Copied from CRM [id number]. Topic: Clinical - Medication Refill >> Sep 01, 2024 11:05 AM Macario HERO wrote: Medication: HYDROcodone -acetaminophen  (NORCO/VICODIN) 5-325 MG tablet [[id number]]  Has the patient contacted their pharmacy? No (Agent: If no, request that the patient contact the pharmacy for the refill. If patient does not wish to contact the pharmacy document the reason why and proceed with request.) (Agent: If yes, when and what did the pharmacy advise?)  This is the patient's preferred pharmacy:  [id number] - [place], [place] - [address] [place] [address] Phone: [phone] Fax: [phone]  Is this the correct pharmacy for this prescription? Yes If no, delete pharmacy and type the correct one.   Has the prescription been filled recently? Yes  Is the patient out of the medication? No  Has the patient been seen for an appointment in the last year OR does the patient have an upcoming appointment? Yes  Can we respond through MyChart? No  Agent: Please be advised that Rx refills may take up to 3 business days. We ask that you follow-up with your pharmacy.

## 2024-09-23 ENCOUNTER — Other Ambulatory Visit: Payer: Self-pay | Admitting: Internal Medicine

## 2024-10-16 ENCOUNTER — Ambulatory Visit: Admitting: Internal Medicine

## 2025-08-10 ENCOUNTER — Encounter: Admitting: Internal Medicine

## 2025-08-10 ENCOUNTER — Ambulatory Visit
